# Patient Record
Sex: Female | Born: 1965 | Race: Black or African American | Hispanic: No | Smoking: Never smoker
Health system: Southern US, Community
[De-identification: ages and names within clinical notes are randomized; demographics above are authoritative.]

## PROBLEM LIST (undated history)

## (undated) DIAGNOSIS — M75100 Unspecified rotator cuff tear or rupture of unspecified shoulder, not specified as traumatic: Secondary | ICD-10-CM

## (undated) DIAGNOSIS — F329 Major depressive disorder, single episode, unspecified: Secondary | ICD-10-CM

## (undated) DIAGNOSIS — N951 Menopausal and female climacteric states: Secondary | ICD-10-CM

## (undated) DIAGNOSIS — T7840XA Allergy, unspecified, initial encounter: Secondary | ICD-10-CM

## (undated) DIAGNOSIS — F32A Depression, unspecified: Secondary | ICD-10-CM

## (undated) DIAGNOSIS — E119 Type 2 diabetes mellitus without complications: Secondary | ICD-10-CM

## (undated) DIAGNOSIS — I639 Cerebral infarction, unspecified: Secondary | ICD-10-CM

## (undated) DIAGNOSIS — M069 Rheumatoid arthritis, unspecified: Secondary | ICD-10-CM

## (undated) DIAGNOSIS — G8929 Other chronic pain: Secondary | ICD-10-CM

## (undated) DIAGNOSIS — J45909 Unspecified asthma, uncomplicated: Secondary | ICD-10-CM

## (undated) DIAGNOSIS — IMO0002 Reserved for concepts with insufficient information to code with codable children: Secondary | ICD-10-CM

## (undated) DIAGNOSIS — S83209A Unspecified tear of unspecified meniscus, current injury, unspecified knee, initial encounter: Secondary | ICD-10-CM

## (undated) DIAGNOSIS — M25569 Pain in unspecified knee: Secondary | ICD-10-CM

## (undated) HISTORY — DX: Reserved for concepts with insufficient information to code with codable children: IMO0002

## (undated) HISTORY — DX: Allergy, unspecified, initial encounter: T78.40XA

## (undated) HISTORY — PX: ROTATOR CUFF REPAIR: SHX139

## (undated) HISTORY — DX: Type 2 diabetes mellitus without complications: E11.9

## (undated) HISTORY — DX: Unspecified tear of unspecified meniscus, current injury, unspecified knee, initial encounter: S83.209A

## (undated) HISTORY — DX: Unspecified asthma, uncomplicated: J45.909

## (undated) HISTORY — PX: TUBAL LIGATION: SHX77

## (undated) HISTORY — DX: Depression, unspecified: F32.A

## (undated) HISTORY — DX: Major depressive disorder, single episode, unspecified: F32.9

---

## 2008-04-25 ENCOUNTER — Emergency Department (HOSPITAL_COMMUNITY): Admission: EM | Admit: 2008-04-25 | Discharge: 2008-04-25 | Payer: Self-pay | Admitting: Emergency Medicine

## 2009-07-13 ENCOUNTER — Emergency Department (HOSPITAL_COMMUNITY): Admission: EM | Admit: 2009-07-13 | Discharge: 2009-07-13 | Payer: Self-pay | Admitting: Emergency Medicine

## 2009-09-06 ENCOUNTER — Emergency Department (HOSPITAL_COMMUNITY): Admission: EM | Admit: 2009-09-06 | Discharge: 2009-09-06 | Payer: Self-pay | Admitting: Emergency Medicine

## 2009-09-19 ENCOUNTER — Emergency Department (HOSPITAL_COMMUNITY): Admission: EM | Admit: 2009-09-19 | Discharge: 2009-09-19 | Payer: Self-pay | Admitting: Emergency Medicine

## 2009-10-18 ENCOUNTER — Encounter: Admission: RE | Admit: 2009-10-18 | Discharge: 2009-11-23 | Payer: Self-pay | Admitting: Sports Medicine

## 2009-12-19 ENCOUNTER — Emergency Department (HOSPITAL_COMMUNITY): Admission: EM | Admit: 2009-12-19 | Discharge: 2009-12-19 | Payer: Self-pay | Admitting: Family Medicine

## 2010-04-10 ENCOUNTER — Emergency Department (HOSPITAL_COMMUNITY): Admission: EM | Admit: 2010-04-10 | Discharge: 2010-04-10 | Payer: Self-pay | Admitting: Emergency Medicine

## 2010-09-17 HISTORY — PX: MENISCUS REPAIR: SHX5179

## 2011-09-29 ENCOUNTER — Encounter (HOSPITAL_COMMUNITY): Payer: Self-pay | Admitting: *Deleted

## 2011-09-29 ENCOUNTER — Emergency Department (HOSPITAL_COMMUNITY)
Admission: EM | Admit: 2011-09-29 | Discharge: 2011-09-29 | Disposition: A | Discharge status: Home / Self Care | Payer: Medicaid Other | Source: Home / Self Care | Attending: Emergency Medicine | Admitting: Emergency Medicine

## 2011-09-29 DIAGNOSIS — J020 Streptococcal pharyngitis: Secondary | ICD-10-CM

## 2011-09-29 DIAGNOSIS — R6889 Other general symptoms and signs: Secondary | ICD-10-CM

## 2011-09-29 HISTORY — DX: Unspecified rotator cuff tear or rupture of unspecified shoulder, not specified as traumatic: M75.100

## 2011-09-29 HISTORY — DX: Menopausal and female climacteric states: N95.1

## 2011-09-29 LAB — POCT RAPID STREP A: Streptococcus, Group A Screen (Direct): POSITIVE — AB

## 2011-09-29 MED ORDER — OSELTAMIVIR PHOSPHATE 75 MG PO CAPS: 75.0000 mg | ORAL_CAPSULE | Freq: Two times a day (BID) | ORAL | Status: AC

## 2011-09-29 MED ORDER — ACETAMINOPHEN-CODEINE #3 300-30 MG PO TABS: 1.0000 | ORAL_TABLET | Freq: Four times a day (QID) | ORAL | Status: AC | PRN

## 2011-09-29 MED ORDER — OSELTAMIVIR PHOSPHATE 75 MG PO CAPS: 75.0000 mg | ORAL_CAPSULE | Freq: Two times a day (BID) | ORAL | Status: DC

## 2011-09-29 MED ORDER — CEPHALEXIN 500 MG PO CAPS: 500.0000 mg | ORAL_CAPSULE | Freq: Four times a day (QID) | ORAL | Status: AC

## 2011-09-29 NOTE — ED Notes (Signed)
EMT called to waiting area to assess pt who reported feeling "weak & dizzy." Pt c/o sore throat, with body ache/weakness for about a week. Pt is currently resting comfortably in a wheelchair with minor nausea and weak/achy/dizzy sensation. Pt advised to notify front desk immediately if beginning to feel any worse.

## 2011-09-29 NOTE — ED Provider Notes (Signed)
History     CSN: [id number]  Arrival date & time 09/29/11  1059   First MD Initiated Contact with Patient 09/29/11 1059      Chief Complaint  Patient presents with  . Cough  . Sore Throat  . Fever  . Generalized Body Aches    (Consider location/radiation/quality/duration/timing/severity/associated sxs/prior treatment) HPI Comments: COUGH, AND A SORE THROAT, WITH CONGESTION. BODY ACHES, IT HURTS TO SWALLOW, HAVE PHLEGM WHEN COUGHING, BODY ACHES, AND HEADACHE S   Patient is a 46 y.o. female presenting with cough, pharyngitis, and fever. The history is provided by the patient.  Cough This is a new problem. The current episode started 2 days ago. The problem occurs constantly. The problem has been gradually worsening. The cough is non-productive. The maximum temperature recorded prior to her arrival was 100 to 100.9 F. Associated symptoms include chills, headaches, rhinorrhea, sore throat and myalgias. Pertinent negatives include no shortness of breath and no wheezing. She has tried decongestants for the symptoms. The treatment provided no relief. Her past medical history is significant for asthma.  Sore Throat Associated symptoms include headaches. Pertinent negatives include no shortness of breath.  Fever Primary symptoms of the febrile illness include fever, fatigue, headaches, cough and myalgias. Primary symptoms do not include wheezing or shortness of breath.  The headache is not associated with neck stiffness.    Past Medical History  Diagnosis Date  . Asthma   . Rotator cuff tear   . Perimenopausal     Past Surgical History  Procedure Date  . Cesarean section   . Oophorectomy     History reviewed. No pertinent family history.  History  Substance Use Topics  . Smoking status: Never Smoker   . Smokeless tobacco: Not on file  . Alcohol Use: Yes     Occasional    OB History    Grav Para Term Preterm Abortions TAB SAB Ect Mult Living                  Review  of Systems  Constitutional: Positive for fever, chills, appetite change and fatigue.  HENT: Positive for congestion, sore throat and rhinorrhea. Negative for trouble swallowing, neck pain and neck stiffness.   Respiratory: Positive for cough. Negative for chest tightness, shortness of breath and wheezing.   Musculoskeletal: Positive for myalgias.  Neurological: Positive for headaches.    Allergies  Review of patient's allergies indicates no known allergies.  Home Medications   Current Outpatient Rx  Name Route Sig Dispense Refill  . ALBUTEROL IN Inhalation Inhale into the lungs.    Marland Kitchen ZOLPIDEM TARTRATE PO Oral Take by mouth.    . ACETAMINOPHEN-CODEINE #3 300-30 MG PO TABS Oral Take 1-2 tablets by mouth every 6 (six) hours as needed for pain. 15 tablet 0  . CEPHALEXIN 500 MG PO CAPS Oral Take 1 capsule (500 mg total) by mouth 4 (four) times daily. X 7 days 20 capsule 0  . OSELTAMIVIR PHOSPHATE 75 MG PO CAPS Oral Take 1 capsule (75 mg total) by mouth every 12 (twelve) hours. 10 capsule 0    BP 139/84  Pulse 106  Temp(Src) 100.4 F (38 C) (Oral)  Resp 20  SpO2 100%  LMP 09/27/2011  Physical Exam  Nursing note and vitals reviewed. Constitutional: She appears well-developed and well-nourished. No distress.  HENT:  Head: Normocephalic.  Mouth/Throat: No oropharyngeal exudate.  Eyes: Conjunctivae are normal. Right eye exhibits no discharge. Left eye exhibits no discharge.  Neck: Neck supple. No  JVD present.  Cardiovascular: Regular rhythm.   No extrasystoles are present. Tachycardia present.  PMI is not displaced.  Exam reveals no gallop.   Pulmonary/Chest: Effort normal and breath sounds normal. No respiratory distress. She has no decreased breath sounds. She has no wheezes. She has no rhonchi.  Lymphadenopathy:    She has cervical adenopathy.    ED Course  Procedures (including critical care time)  Labs Reviewed  POCT RAPID STREP A ([facility] CARE ONLY) - Abnormal; Notable for  the following:    Streptococcus, Group A Screen (Direct) POSITIVE (*)    All other components within normal limits   No results found.   1. Influenza-like symptoms   2. Streptococcal pharyngitis       MDM  ILI sxs with pharyngitis and recent exposure to strep, * positive and with anterior cervical LAD Positive household contact with ILI        Jimmie Molly, MD 09/29/11 1653

## 2011-09-29 NOTE — ED Notes (Signed)
Reports severe sore throat, productive cough "with some blood in it", body aches, fever/chills, chest congestion.  All sxs progressively worse since yesterday.  Family member had influenza last week; pt recently had strep.  Has been using albuterol inhaler more frequently; taking Motrin and hydrocodone yesterday.

## 2011-09-29 NOTE — ED Notes (Signed)
T/C from pharmacist: states pt received narcotic Rx 1-2 days ago, inquiring whether Dr. Ladon Applebaum still wished to allow Tylenol-Codeine Rx; per Dr. Ladon Applebaum: dispense #10.

## 2011-09-29 NOTE — ED Notes (Signed)
Pt tolerating ginger ale sips.

## 2012-06-17 ENCOUNTER — Emergency Department (HOSPITAL_COMMUNITY): Payer: Medicaid Other

## 2012-06-17 ENCOUNTER — Emergency Department (HOSPITAL_COMMUNITY)
Admission: EM | Admit: 2012-06-17 | Discharge: 2012-06-17 | Disposition: A | Discharge status: Home / Self Care | Payer: Medicaid Other | Attending: Emergency Medicine | Admitting: Emergency Medicine

## 2012-06-17 ENCOUNTER — Encounter (HOSPITAL_COMMUNITY): Payer: Self-pay | Admitting: Adult Health

## 2012-06-17 DIAGNOSIS — M25561 Pain in right knee: Secondary | ICD-10-CM

## 2012-06-17 DIAGNOSIS — M25569 Pain in unspecified knee: Secondary | ICD-10-CM | POA: Insufficient documentation

## 2012-06-17 DIAGNOSIS — J45909 Unspecified asthma, uncomplicated: Secondary | ICD-10-CM | POA: Insufficient documentation

## 2012-06-17 MED ORDER — TRAMADOL HCL 50 MG PO TABS
50.0000 mg | ORAL_TABLET | Freq: Four times a day (QID) | ORAL | Status: DC | PRN
Start: 2012-06-17 — End: 2013-08-10

## 2012-06-17 MED ORDER — IBUPROFEN 600 MG PO TABS: 600.0000 mg | ORAL_TABLET | Freq: Four times a day (QID) | ORAL | Status: DC | PRN

## 2012-06-17 MED ORDER — KETOROLAC TROMETHAMINE 60 MG/2ML IM SOLN
60.0000 mg | Freq: Once | INTRAMUSCULAR | Status: AC
  Administered 2012-06-17: 60 mg via INTRAMUSCULAR
  Filled 2012-06-17: qty 2

## 2012-06-17 NOTE — ED Notes (Signed)
C/o intermittent leg pain for 20 years since had a knee dislocation, leg pain has gotten worse over the last hour, c/o trying to go up steps and feeling a "snap" in right knee. Unable to lift right leg. Pain is from right knee down. Pt is concerned about the constant bilateral leg pain and the right knee. Concerned her potassium may be low. CMS intact.

## 2012-06-17 NOTE — ED Notes (Signed)
Paged Ortho  

## 2012-06-17 NOTE — Progress Notes (Signed)
Orthopedic Tech Progress Note Patient Details:  [name] 12-08-1965 [id number]  Ortho Devices Type of Ortho Device: Knee Immobilizer;Crutches Ortho Device/Splint Location: (R) LE Ortho Device/Splint Interventions: Application   Jennye Moccasin 06/17/2012, 4:57 PM

## 2012-06-17 NOTE — ED Provider Notes (Signed)
History  Scribed for No att. providers found, the patient was seen in room TR06C/TR06C. This chart was scribed by Candelaria Stagers. The patient's care started at 8:06 PM   CSN: [id number]  Arrival date & time 06/17/12  1508   First MD Initiated Contact with Patient 06/17/12 1532      Chief Complaint  Patient presents with  . Leg Pain    The history is provided by the patient. No language interpreter was used.   [name] is a 46 y.o. female who presents to the Emergency Department complaining of chronic right knee pain that became worse after twisting the knee while coming down stairs earlier today.  After twisting the knee she felt the knee give way.  Pt states she injured the knee about twenty years ago. Bending and straightening the leg makes the pain worse.   Past Medical History  Diagnosis Date  . Asthma   . Rotator cuff tear   . Perimenopausal     Past Surgical History  Procedure Date  . Cesarean section   . Oophorectomy     History reviewed. No pertinent family history.  History  Substance Use Topics  . Smoking status: Never Smoker   . Smokeless tobacco: Not on file  . Alcohol Use: Yes     Occasional    OB History    Grav Para Term Preterm Abortions TAB SAB Ect Mult Living                  Review of Systems  Musculoskeletal: Positive for arthralgias (right knee pain).  All other systems reviewed and are negative.    Allergies  Review of patient's allergies indicates no known allergies.  Home Medications   Current Outpatient Rx  Name Route Sig Dispense Refill  . ALBUTEROL SULFATE HFA 108 (90 BASE) MCG/ACT IN AERS Inhalation Inhale 2 puffs into the lungs every 6 (six) hours as needed. For wheezing    . HYDROCODONE-ACETAMINOPHEN 7.5-325 MG PO TABS Oral Take 1 tablet by mouth every 6 (six) hours as needed. For pain    . IBUPROFEN 600 MG PO TABS Oral Take 1 tablet (600 mg total) by mouth every 6 (six) hours as needed for pain. 30 tablet 0  . TRAMADOL  HCL 50 MG PO TABS Oral Take 1 tablet (50 mg total) by mouth every 6 (six) hours as needed for pain. 15 tablet 0    BP 133/64  Pulse 74  Temp 97.6 F (36.4 C) (Oral)  Resp 18  SpO2 98%  LMP 09/27/2011  Physical Exam  Nursing note and vitals reviewed. Constitutional: She is oriented to person, place, and time. She appears well-developed and well-nourished. No distress.  HENT:  Head: Normocephalic and atraumatic.  Eyes: EOM are normal. Pupils are equal, round, and reactive to light.  Neck: Neck supple. No tracheal deviation present.  Pulmonary/Chest: Effort normal. No respiratory distress.  Musculoskeletal: Normal range of motion. She exhibits no edema.       Mild tenderness over the lateral side of right knee joint.  Decreased ROM esp with flexion due to pain.  No instability.  Obvious trauma, swelling.  Good pulses distally.   Neurological: She is alert and oriented to person, place, and time.  Skin: Skin is warm and dry.  Psychiatric: She has a normal mood and affect. Her behavior is normal.    ED Course  Procedures   DIAGNOSTIC STUDIES: Oxygen Saturation is 98% on room air, normal by my interpretation.  COORDINATION OF CARE:  15:19 Ordered: DG Knee Complete Views Right    Labs Reviewed - No data to display Dg Knee Complete 4 Views Right  06/17/2012  *RADIOLOGY REPORT*  Clinical Data: Pain post trauma  RIGHT KNEE - COMPLETE 4+ VIEW  Comparison: None.  Findings:  Frontal, lateral, and bilateral oblique views were obtained.  There is no fracture, dislocation, or effusion.  There is slight narrowing medially.  There is slight spurring laterally. No erosive change.  IMPRESSION: Mild osteoarthritic change.  No fracture or effusion.   Original Report Authenticated By: Arvin Collard. WOODRUFF III, M.D.      1. Right knee pain       MDM  I personally performed the services described in this documentation, which was scribed in my presence. The recorded information has been  reviewed and considered.         Loren Racer, MD 06/17/12 2006

## 2012-07-02 ENCOUNTER — Encounter (HOSPITAL_COMMUNITY): Payer: Self-pay | Admitting: Psychology

## 2012-07-02 ENCOUNTER — Ambulatory Visit (INDEPENDENT_AMBULATORY_CARE_PROVIDER_SITE_OTHER): Payer: Medicaid Other | Admitting: Psychology

## 2012-07-02 DIAGNOSIS — F3289 Other specified depressive episodes: Secondary | ICD-10-CM

## 2012-07-02 DIAGNOSIS — F329 Major depressive disorder, single episode, unspecified: Secondary | ICD-10-CM
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
## 2012-07-03 ENCOUNTER — Encounter (HOSPITAL_COMMUNITY): Payer: Self-pay | Admitting: Psychology

## 2012-07-03 DIAGNOSIS — F329 Major depressive disorder, single episode, unspecified: Secondary | ICD-10-CM | POA: Insufficient documentation

## 2012-07-09 ENCOUNTER — Ambulatory Visit (INDEPENDENT_AMBULATORY_CARE_PROVIDER_SITE_OTHER): Payer: Medicaid Other | Admitting: Psychology

## 2012-07-09 DIAGNOSIS — F329 Major depressive disorder, single episode, unspecified: Secondary | ICD-10-CM
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
## 2012-07-16 ENCOUNTER — Ambulatory Visit (HOSPITAL_COMMUNITY): Payer: Self-pay | Admitting: Psychology

## 2012-07-23 ENCOUNTER — Ambulatory Visit (INDEPENDENT_AMBULATORY_CARE_PROVIDER_SITE_OTHER): Payer: Medicaid Other | Admitting: Psychology

## 2012-07-23 DIAGNOSIS — F329 Major depressive disorder, single episode, unspecified: Secondary | ICD-10-CM
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
## 2012-07-30 ENCOUNTER — Ambulatory Visit (HOSPITAL_COMMUNITY): Payer: Self-pay | Admitting: Psychology

## 2012-08-06 ENCOUNTER — Ambulatory Visit (INDEPENDENT_AMBULATORY_CARE_PROVIDER_SITE_OTHER): Payer: Medicaid Other | Admitting: Psychology

## 2012-08-06 DIAGNOSIS — F329 Major depressive disorder, single episode, unspecified: Secondary | ICD-10-CM
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
## 2012-08-26 ENCOUNTER — Ambulatory Visit
Admission: RE | Admit: 2012-08-26 | Discharge: 2012-08-26 | Disposition: A | Discharge status: Home / Self Care | Payer: Medicaid Other | Source: Ambulatory Visit | Attending: Orthopedic Surgery | Admitting: Orthopedic Surgery

## 2012-08-26 ENCOUNTER — Other Ambulatory Visit: Payer: Self-pay | Admitting: Orthopedic Surgery

## 2012-08-26 DIAGNOSIS — R609 Edema, unspecified: Secondary | ICD-10-CM

## 2012-08-29 ENCOUNTER — Ambulatory Visit (INDEPENDENT_AMBULATORY_CARE_PROVIDER_SITE_OTHER): Payer: Medicaid Other | Admitting: Psychology

## 2012-08-29 DIAGNOSIS — F329 Major depressive disorder, single episode, unspecified: Secondary | ICD-10-CM
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
## 2012-10-03 ENCOUNTER — Other Ambulatory Visit: Payer: Self-pay | Admitting: Orthopedic Surgery

## 2012-10-03 DIAGNOSIS — M25511 Pain in right shoulder: Secondary | ICD-10-CM

## 2012-10-10 ENCOUNTER — Ambulatory Visit
Admission: RE | Admit: 2012-10-10 | Discharge: 2012-10-10 | Disposition: A | Discharge status: Home / Self Care | Payer: Medicaid Other | Source: Ambulatory Visit | Attending: Orthopedic Surgery | Admitting: Orthopedic Surgery

## 2012-10-10 DIAGNOSIS — M25511 Pain in right shoulder: Secondary | ICD-10-CM

## 2012-10-22 ENCOUNTER — Ambulatory Visit (INDEPENDENT_AMBULATORY_CARE_PROVIDER_SITE_OTHER): Payer: Federal, State, Local not specified - Other | Admitting: Psychology

## 2012-10-22 DIAGNOSIS — F329 Major depressive disorder, single episode, unspecified: Secondary | ICD-10-CM
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
## 2012-10-29 ENCOUNTER — Ambulatory Visit (HOSPITAL_COMMUNITY): Payer: Self-pay | Admitting: Psychology

## 2013-01-01 ENCOUNTER — Encounter (HOSPITAL_COMMUNITY): Payer: Self-pay | Admitting: Psychology

## 2013-02-04 ENCOUNTER — Ambulatory Visit (INDEPENDENT_AMBULATORY_CARE_PROVIDER_SITE_OTHER): Payer: Federal, State, Local not specified - Other | Admitting: Psychology

## 2013-02-04 DIAGNOSIS — F329 Major depressive disorder, single episode, unspecified: Secondary | ICD-10-CM
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
## 2013-05-12 ENCOUNTER — Ambulatory Visit (HOSPITAL_COMMUNITY): Payer: Self-pay | Admitting: Licensed Clinical Social Worker

## 2013-05-12 ENCOUNTER — Encounter (HOSPITAL_COMMUNITY): Payer: Self-pay | Admitting: Licensed Clinical Social Worker

## 2013-05-12 NOTE — Progress Notes (Signed)
Patient ID: [name], female   DOB: [birth year], 47 y.o.   MRN: [id number] Patient cancelled late for appointment due to strep throat. She has rescheduled.

## 2013-07-02 ENCOUNTER — Encounter (INDEPENDENT_AMBULATORY_CARE_PROVIDER_SITE_OTHER): Payer: Self-pay

## 2013-07-02 ENCOUNTER — Ambulatory Visit (INDEPENDENT_AMBULATORY_CARE_PROVIDER_SITE_OTHER): Payer: Federal, State, Local not specified - Other | Admitting: Licensed Clinical Social Worker

## 2013-07-02 DIAGNOSIS — F4323 Adjustment disorder with mixed anxiety and depressed mood: Secondary | ICD-10-CM
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
## 2013-08-04 ENCOUNTER — Ambulatory Visit (HOSPITAL_COMMUNITY): Payer: Self-pay | Admitting: Licensed Clinical Social Worker

## 2013-08-04 ENCOUNTER — Encounter (HOSPITAL_COMMUNITY): Payer: Self-pay | Admitting: Licensed Clinical Social Worker

## 2013-08-04 NOTE — Progress Notes (Signed)
Patient ID: [name], female   DOB: 1965/12/21, 47 y.o.   MRN: [id number] Patient was a no show no call.

## 2013-08-10 ENCOUNTER — Emergency Department (HOSPITAL_COMMUNITY): Payer: Medicaid Other

## 2013-08-10 ENCOUNTER — Emergency Department (INDEPENDENT_AMBULATORY_CARE_PROVIDER_SITE_OTHER): Payer: Medicaid Other

## 2013-08-10 ENCOUNTER — Encounter (HOSPITAL_COMMUNITY): Payer: Self-pay | Admitting: Emergency Medicine

## 2013-08-10 ENCOUNTER — Emergency Department (HOSPITAL_COMMUNITY)
Admission: EM | Admit: 2013-08-10 | Discharge: 2013-08-10 | Disposition: A | Discharge status: Home / Self Care | Payer: Medicaid Other | Attending: Emergency Medicine | Admitting: Emergency Medicine

## 2013-08-10 ENCOUNTER — Emergency Department (HOSPITAL_COMMUNITY)
Admission: EM | Admit: 2013-08-10 | Discharge: 2013-08-10 | Disposition: A | Discharge status: Home / Self Care | Payer: Medicaid Other | Source: Home / Self Care | Attending: Family Medicine | Admitting: Family Medicine

## 2013-08-10 DIAGNOSIS — Y9301 Activity, walking, marching and hiking: Secondary | ICD-10-CM | POA: Insufficient documentation

## 2013-08-10 DIAGNOSIS — S99921A Unspecified injury of right foot, initial encounter: Secondary | ICD-10-CM

## 2013-08-10 DIAGNOSIS — Z87828 Personal history of other (healed) physical injury and trauma: Secondary | ICD-10-CM | POA: Insufficient documentation

## 2013-08-10 DIAGNOSIS — T148XXA Other injury of unspecified body region, initial encounter: Secondary | ICD-10-CM

## 2013-08-10 DIAGNOSIS — Z9889 Other specified postprocedural states: Secondary | ICD-10-CM | POA: Insufficient documentation

## 2013-08-10 DIAGNOSIS — S8990XA Unspecified injury of unspecified lower leg, initial encounter: Secondary | ICD-10-CM | POA: Insufficient documentation

## 2013-08-10 DIAGNOSIS — S4980XA Other specified injuries of shoulder and upper arm, unspecified arm, initial encounter: Secondary | ICD-10-CM | POA: Insufficient documentation

## 2013-08-10 DIAGNOSIS — Y9289 Other specified places as the place of occurrence of the external cause: Secondary | ICD-10-CM | POA: Insufficient documentation

## 2013-08-10 DIAGNOSIS — J45909 Unspecified asthma, uncomplicated: Secondary | ICD-10-CM | POA: Insufficient documentation

## 2013-08-10 DIAGNOSIS — W010XXA Fall on same level from slipping, tripping and stumbling without subsequent striking against object, initial encounter: Secondary | ICD-10-CM | POA: Insufficient documentation

## 2013-08-10 DIAGNOSIS — S8991XA Unspecified injury of right lower leg, initial encounter: Secondary | ICD-10-CM

## 2013-08-10 DIAGNOSIS — W19XXXA Unspecified fall, initial encounter: Secondary | ICD-10-CM

## 2013-08-10 DIAGNOSIS — S4991XA Unspecified injury of right shoulder and upper arm, initial encounter: Secondary | ICD-10-CM

## 2013-08-10 DIAGNOSIS — Z8742 Personal history of other diseases of the female genital tract: Secondary | ICD-10-CM | POA: Insufficient documentation

## 2013-08-10 DIAGNOSIS — S46909A Unspecified injury of unspecified muscle, fascia and tendon at shoulder and upper arm level, unspecified arm, initial encounter: Secondary | ICD-10-CM | POA: Insufficient documentation

## 2013-08-10 DIAGNOSIS — Z79899 Other long term (current) drug therapy: Secondary | ICD-10-CM | POA: Insufficient documentation

## 2013-08-10 DIAGNOSIS — G8929 Other chronic pain: Secondary | ICD-10-CM | POA: Insufficient documentation

## 2013-08-10 HISTORY — DX: Pain in unspecified knee: M25.569

## 2013-08-10 HISTORY — DX: Other chronic pain: G89.29

## 2013-08-10 MED ORDER — HYDROCODONE-ACETAMINOPHEN 5-325 MG PO TABS: 2.0000 | ORAL_TABLET | ORAL | Status: DC | PRN

## 2013-08-10 MED ORDER — CYCLOBENZAPRINE HCL 5 MG PO TABS: 5.0000 mg | ORAL_TABLET | Freq: Three times a day (TID) | ORAL | Status: DC | PRN

## 2013-08-10 MED ORDER — IBUPROFEN 800 MG PO TABS: 800.0000 mg | ORAL_TABLET | Freq: Three times a day (TID) | ORAL | Status: DC

## 2013-08-10 NOTE — ED Provider Notes (Signed)
Medical screening examination/treatment/procedure(s) were performed by non-physician practitioner and as supervising physician I was immediately available for consultation/collaboration.  EKG Interpretation   None         Travius Crochet M Radley Teston, MD 08/10/13 1511 

## 2013-08-10 NOTE — ED Provider Notes (Signed)
Medical screening examination/treatment/procedure(s) were performed by a resident physician or non-physician practitioner and as the supervising physician I was immediately available for consultation/collaboration.  Evan Corey, MD      Evan S Corey, MD 08/10/13 2114 

## 2013-08-10 NOTE — ED Provider Notes (Signed)
CSN: [id number]     Arrival date & time 08/10/13  4098 History   First MD Initiated Contact with Patient 08/10/13 1014     Chief Complaint  Patient presents with  . Knee Pain  . Shoulder Pain   (Consider location/radiation/quality/duration/timing/severity/associated sxs/prior Treatment) HPI Comments: Patient is a 47 year old female who presents after a fall that occurred yesterday. Patient reports walking to her car in the rain when she slipped and landed on her right side in the parking lot. She reports immediate onset of throbbing and severe pain in her right shoulder, right knee and right foot. Movement of the joints makes the pain worse. No alleviating factors. She tried taking tylenol which provided no relief. She has a history of right shoulder and right knee surgery and is concerned about re-injuring those joints. She made an appointment to follow up with Dr. Thurston Hole later this week. Patient denies any other injury.    Past Medical History  Diagnosis Date  . Asthma   . Rotator cuff tear   . Perimenopausal   . Slipped intervertebral disc   . Meniscus tear   . Chronic knee pain     right   Past Surgical History  Procedure Laterality Date  . Cesarean section    . Oophorectomy    . Tubal ligation     Family History  Problem Relation Age of Onset  . Cancer Mother     colon cancer  . Cancer Father     brain cancer  . Alcohol abuse Father   . Diabetes Father    History  Substance Use Topics  . Smoking status: Never Smoker   . Smokeless tobacco: Never Used  . Alcohol Use: No   OB History   Grav Para Term Preterm Abortions TAB SAB Ect Mult Living                 Review of Systems  Musculoskeletal: Positive for arthralgias.  All other systems reviewed and are negative.    Allergies  Diclofenac sodium and Meloxicam  Home Medications   Current Outpatient Rx  Name  Route  Sig  Dispense  Refill  . acetaminophen (TYLENOL) 500 MG tablet   Oral   Take 500 mg by  mouth every 6 (six) hours as needed.         Marland Kitchen albuterol (PROVENTIL HFA;VENTOLIN HFA) 108 (90 BASE) MCG/ACT inhaler   Inhalation   Inhale 2 puffs into the lungs every 6 (six) hours as needed. For wheezing          BP 132/76  Pulse 82  Temp(Src) 97.9 F (36.6 C) (Oral)  Resp 18  Ht 5' 9.5" (1.765 m)  Wt 240 lb (108.863 kg)  BMI 34.95 kg/m2  SpO2 97%  LMP 09/27/2011 Physical Exam  Nursing note and vitals reviewed. Constitutional: She is oriented to person, place, and time. She appears well-developed and well-nourished. No distress.  HENT:  Head: Normocephalic and atraumatic.  Eyes: Conjunctivae are normal.  Neck: Normal range of motion.  Cardiovascular: Normal rate and regular rhythm.  Exam reveals no gallop and no friction rub.   No murmur heard. Pulmonary/Chest: Effort normal and breath sounds normal. She has no wheezes. She has no rales. She exhibits no tenderness.  Musculoskeletal:  Right shoulder, right knee ROM slightly limited due to pain. Generalized tenderness to palpation of the affected joints. No obvious deformity. Tenderness to palpation of lateral right foot without point tenderness to palpation.   Neurological: She  is alert and oriented to person, place, and time. Coordination normal.  Speech is goal-oriented. Moves limbs without ataxia.   Skin: Skin is warm and dry.  Psychiatric: She has a normal mood and affect. Her behavior is normal.    ED Course  Procedures (including critical care time) Labs Review Labs Reviewed - No data to display Imaging Review Dg Shoulder Right  08/10/2013   CLINICAL DATA:  Fall, pain  EXAM: RIGHT SHOULDER - 2+ VIEW  COMPARISON:  10/10/2012 MRI  FINDINGS: There is no evidence of fracture or dislocation. There is no evidence of arthropathy or other focal bone abnormality. Soft tissues are unremarkable.  IMPRESSION: No acute osseous finding   Electronically Signed   By: Ruel Favors M.D.   On: 08/10/2013 10:49   Dg Knee Complete 4  Views Right  08/10/2013   CLINICAL DATA:  Fall, lateral pain  EXAM: RIGHT KNEE - COMPLETE 4+ VIEW  COMPARISON:  06/26/2012, 06/17/2012  FINDINGS: Minor arthritic changes with bony spurring of the femur and tibia articular surfaces. Slight joint space narrowing in the medial compartment. No acute fracture, malalignment, or effusion. No significant interval change.  IMPRESSION: Minor degenerative changes by plain radiography.  No acute finding.   Electronically Signed   By: Ruel Favors M.D.   On: 08/10/2013 10:52   Dg Foot Complete Right  08/10/2013   CLINICAL DATA:  Fall, metatarsal pain  EXAM: RIGHT FOOT COMPLETE - 3+ VIEW  COMPARISON:  None.  FINDINGS: Normal alignment without fracture. Preserved joint spaces. No significant arthropathy. No soft tissue abnormality. Small calcaneal spurring noted.  IMPRESSION: No acute osseous finding   Electronically Signed   By: Ruel Favors M.D.   On: 08/10/2013 10:47    EKG Interpretation   None       MDM   1. Fall, initial encounter   2. Right shoulder injury, initial encounter   3. Right knee injury, initial encounter   4. Right foot injury, initial encounter     11:09 AM Patient's xrays unremarkable for acute injuries. No neurovascular compromise. Patient will have Vicodin for pain. Patient made an appointment to follow up with Dr. Thurston Hole this week. Patient instructed to return with worsening or concerning symptoms.     Emilia Beck, PA-C 08/10/13 1118
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
## 2013-08-11 ENCOUNTER — Encounter (HOSPITAL_COMMUNITY): Payer: Self-pay | Admitting: Licensed Clinical Social Worker

## 2013-08-11 ENCOUNTER — Ambulatory Visit (HOSPITAL_COMMUNITY): Payer: Self-pay | Admitting: Licensed Clinical Social Worker

## 2013-08-11 NOTE — Progress Notes (Signed)
Patient ID: [name], female   DOB: [birth year], 47 y.o.   MRN: [id number] Patient was a no show. Called and spoke to patient who believed her appointment was at 11:30am. Reviewed no show policy with patient.

## 2013-08-18 ENCOUNTER — Ambulatory Visit (INDEPENDENT_AMBULATORY_CARE_PROVIDER_SITE_OTHER): Payer: Federal, State, Local not specified - Other | Admitting: Licensed Clinical Social Worker

## 2013-08-18 DIAGNOSIS — F4323 Adjustment disorder with mixed anxiety and depressed mood: Secondary | ICD-10-CM
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
## 2013-08-25 ENCOUNTER — Ambulatory Visit (INDEPENDENT_AMBULATORY_CARE_PROVIDER_SITE_OTHER): Payer: Federal, State, Local not specified - Other | Admitting: Licensed Clinical Social Worker

## 2013-08-25 DIAGNOSIS — F4323 Adjustment disorder with mixed anxiety and depressed mood: Secondary | ICD-10-CM
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
## 2013-09-01 ENCOUNTER — Ambulatory Visit (INDEPENDENT_AMBULATORY_CARE_PROVIDER_SITE_OTHER): Payer: Federal, State, Local not specified - Other | Admitting: Licensed Clinical Social Worker

## 2013-09-01 DIAGNOSIS — F4323 Adjustment disorder with mixed anxiety and depressed mood: Secondary | ICD-10-CM

## 2013-09-01 NOTE — Progress Notes (Signed)
   THERAPIST PROGRESS NOTE  Session Time: 9:30am-10:20am  Participation Level: Active  Behavioral Response: Well GroomedAlertAnxious and Depressed  Type of Therapy: Individual Therapy  Treatment Goals addressed: Anger, Anxiety, Communication: assertive communication, increased focus on asking for help, intentional self care  and Coping  Interventions: Motivational Interviewing, Strength-based and Supportive  Summary: [name] is a 47 y.o. female who presents with anxious mood and affect. She reports ongoing stress related to her children and the lawsuit with their school. She processes her attempts to improve her self care and to ask for help. She explores why this is difficult for her and how she can make changes. She is positively focused on her children who are doing well. Her sleep and appetite are wnl.   Suicidal/Homicidal: Nowithout intent/plan  Therapist Response: Assessed patients current functioning and reviewed progress. Reviewed coping strategies. Assessed patients safety and assisted in identifying protective factors.  Reviewed crisis plan with patient. Assisted patient with the expression of frustration and fear. Reviewed patients self care plan. Assessed progress related to self care. Patients self care is good. Recommend daily exercise, increased socialization and recreation. Used CBT to assist patient with the identification of negative distortions and irrational thoughts. Encouraged patient to verbalize alternative and factual responses which challenge thought distortions. Reviewed healthy boundaries and assertive communication. Used motivational interviewing to assist and encourage patient through the change process. Explored patients barriers to change.   Plan: Return again in two weeks.  Diagnosis: Axis I: Adjustment Disorder with Mixed Emotional Features    Axis II: No diagnosis    Kolter Reaver, LCSW 09/01/2013

## 2013-09-08 ENCOUNTER — Ambulatory Visit (INDEPENDENT_AMBULATORY_CARE_PROVIDER_SITE_OTHER): Payer: Federal, State, Local not specified - Other | Admitting: Licensed Clinical Social Worker

## 2013-09-08 DIAGNOSIS — F329 Major depressive disorder, single episode, unspecified: Secondary | ICD-10-CM

## 2013-09-08 NOTE — Progress Notes (Signed)
   THERAPIST PROGRESS NOTE  Session Time: 10:30am-11:20am  Participation Level: Active  Behavioral Response: Well GroomedAlertAnxious and Depressed  Type of Therapy: Individual Therapy  Treatment Goals addressed: Coping  Interventions: CBT, Strength-based, Supportive and Reframing  Summary: EUTHA CUDE is a 47 y.o. female who presents with depressed mood and flat affect. She states that she feels "heavy with sad emotion today". She is disappointed by the recommendations of her attorneys and states that she feels oppressed. She questions what her next steps should be and evaluates how her mental wellness is affected by this ongoing fight. She finds herself with more agitation towards her children which is unusual and upsets her. Her sleep and appetite are disrupted by her stress.    Suicidal/Homicidal: Nowithout intent/plan  Therapist Response: Assessed patients current functioning and reviewed progress. Reviewed coping strategies. Assessed patients safety and assisted in identifying protective factors.  Reviewed crisis plan with patient. Assisted patient with the expression of frustration. Reviewed patients self care plan. Assessed progress related to self care. Patients self care is good. Recommend daily exercise, increased socialization and recreation. Used CBT to assist patient with the identification of negative distortions and irrational thoughts. Encouraged patient to verbalize alternative and factual responses which challenge thought distortions. Reviewed healthy boundaries and assertive communication. Used DBT to practice mindfulness, review distraction list and improve distress tolerance skills.   Plan: Return again in one  Weeks. Patient will develop a self care plan that includes exercise and stress reduction activities. Patient will use cognitive behavioral strategies to combat any negative distorted thoughts.   Diagnosis: Axis I: Adjustment Disorder with Mixed Emotional  Features    Axis II: No diagnosis    Keyunna Coco, LCSW 09/08/2013

## 2013-09-15 ENCOUNTER — Ambulatory Visit (HOSPITAL_COMMUNITY): Payer: Self-pay | Admitting: Licensed Clinical Social Worker

## 2013-09-22 ENCOUNTER — Ambulatory Visit (HOSPITAL_COMMUNITY): Payer: Self-pay | Admitting: Licensed Clinical Social Worker

## 2013-10-02 ENCOUNTER — Encounter (HOSPITAL_COMMUNITY): Payer: Self-pay | Admitting: Emergency Medicine

## 2013-10-02 ENCOUNTER — Emergency Department (HOSPITAL_COMMUNITY)
Admission: EM | Admit: 2013-10-02 | Discharge: 2013-10-02 | Disposition: A | Discharge status: Home / Self Care | Payer: Medicaid Other | Attending: Emergency Medicine | Admitting: Emergency Medicine

## 2013-10-02 DIAGNOSIS — Z9889 Other specified postprocedural states: Secondary | ICD-10-CM | POA: Insufficient documentation

## 2013-10-02 DIAGNOSIS — Y9389 Activity, other specified: Secondary | ICD-10-CM | POA: Insufficient documentation

## 2013-10-02 DIAGNOSIS — X500XXA Overexertion from strenuous movement or load, initial encounter: Secondary | ICD-10-CM | POA: Insufficient documentation

## 2013-10-02 DIAGNOSIS — S46909A Unspecified injury of unspecified muscle, fascia and tendon at shoulder and upper arm level, unspecified arm, initial encounter: Secondary | ICD-10-CM | POA: Insufficient documentation

## 2013-10-02 DIAGNOSIS — G8929 Other chronic pain: Secondary | ICD-10-CM | POA: Insufficient documentation

## 2013-10-02 DIAGNOSIS — Y929 Unspecified place or not applicable: Secondary | ICD-10-CM | POA: Insufficient documentation

## 2013-10-02 DIAGNOSIS — S4980XA Other specified injuries of shoulder and upper arm, unspecified arm, initial encounter: Secondary | ICD-10-CM | POA: Insufficient documentation

## 2013-10-02 DIAGNOSIS — J45909 Unspecified asthma, uncomplicated: Secondary | ICD-10-CM | POA: Insufficient documentation

## 2013-10-02 DIAGNOSIS — Z8739 Personal history of other diseases of the musculoskeletal system and connective tissue: Secondary | ICD-10-CM | POA: Insufficient documentation

## 2013-10-02 DIAGNOSIS — S4991XA Unspecified injury of right shoulder and upper arm, initial encounter: Secondary | ICD-10-CM

## 2013-10-02 DIAGNOSIS — Z8742 Personal history of other diseases of the female genital tract: Secondary | ICD-10-CM | POA: Insufficient documentation

## 2013-10-02 DIAGNOSIS — Z79899 Other long term (current) drug therapy: Secondary | ICD-10-CM | POA: Insufficient documentation

## 2013-10-02 MED ORDER — HYDROCODONE-ACETAMINOPHEN 5-325 MG PO TABS: 1.0000 | ORAL_TABLET | Freq: Four times a day (QID) | ORAL | Status: DC | PRN

## 2013-10-02 MED ORDER — HYDROCODONE-ACETAMINOPHEN 5-325 MG PO TABS
1.0000 | ORAL_TABLET | Freq: Once | ORAL | Status: AC
  Administered 2013-10-02: 1 via ORAL
  Filled 2013-10-02: qty 1

## 2013-10-02 NOTE — ED Notes (Signed)
Per pt pulled something in right shoulder, has surgery in Sept

## 2013-10-02 NOTE — ED Provider Notes (Signed)
Medical screening examination/treatment/procedure(s) were performed by non-physician practitioner and as supervising physician I was immediately available for consultation/collaboration.  EKG Interpretation   None        Jasper Riling. Alvino Chapel, MD 10/02/13 1504

## 2013-10-02 NOTE — ED Provider Notes (Signed)
CSN: [id number]     Arrival date & time 10/02/13  1033 History   First MD Initiated Contact with Patient 10/02/13 1043     No chief complaint on file.  (Consider location/radiation/quality/duration/timing/severity/associated sxs/prior Treatment) HPI  48 year old female with history of chronic knee pain, rotator cuff tear presents for evaluation of right arm pain. 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 Noemi Chapel last September.  She has been doing well with rehab.  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  While caring for her child she experienced a muscle pulled sensation to R shoulder follows with sharp throbbing pain.  Pain is constant, worse with movement, radiates down to arm, and minimally improves with heat/ice and rest.  She tries to rehab her shoulder using exercise at home without improvement.  She plan to f/u with her ortho specialist next week but is here for further manage of her condition.  She worries of re-injuring her shoulder.  Denies fever, neck pain, headache, numbness, weakness, or rash.  No cp or sob.    Past Medical History  Diagnosis Date  . Asthma   . Rotator cuff tear   . Perimenopausal   . Slipped intervertebral disc   . Meniscus tear   . Chronic knee pain     right   Past Surgical History  Procedure Laterality Date  . Cesarean section    . Oophorectomy    . Tubal ligation     Family History  Problem Relation Age of Onset  . Cancer Mother     colon cancer  . Cancer Father     brain cancer  . Alcohol abuse Father   . Diabetes Father    History  Substance Use Topics  . Smoking status: Never Smoker   . Smokeless tobacco: Never Used  . Alcohol Use: No   OB History   Grav Para Term Preterm Abortions TAB SAB Ect Mult Living                 Review of Systems  Constitutional: Negative for fever.   Musculoskeletal: Positive for arthralgias.  Neurological: Negative for numbness.    Allergies  Diclofenac sodium and Meloxicam  Home Medications   Current Outpatient Rx  Name  Route  Sig  Dispense  Refill  . acetaminophen (TYLENOL) 500 MG tablet   Oral   Take 500 mg by mouth every 6 (six) hours as needed.         Marland Kitchen albuterol (PROVENTIL HFA;VENTOLIN HFA) 108 (90 BASE) MCG/ACT inhaler   Inhalation   Inhale 2 puffs into the lungs every 6 (six) hours as needed. For wheezing         . ibuprofen (ADVIL,MOTRIN) 200 MG tablet   Oral   Take 400 mg by mouth every 6 (six) hours as needed.          BP 134/68  Pulse 76  Temp(Src) 97.8 F (36.6 C) (Oral)  Resp 17  SpO2 98%  LMP 09/27/2011 Physical Exam  Nursing note and vitals reviewed. Constitutional: She appears well-developed and well-nourished. No distress.  HENT:  Head: Atraumatic.  Eyes: Conjunctivae are normal.  Neck: Neck supple.  Musculoskeletal: She exhibits tenderness (R shoulder: decreased shoulder abduction, adduction, rotation, or elevation due to pain.  pain to shoulder most specific at lateral and glenohumeral region without deformity  or signs of dislocation.  no swelling or rash.  ). She exhibits no edema.  Normal elbow and wrist.  Brisk cap refills, radial pulses 2+ to R arm.    Neurological: She is alert.  Skin: No rash noted.  Psychiatric: She has a normal mood and affect.    ED Course  Procedures (including critical care time)  11:14 AM Patient with right rotator cuff injury status post surgical repair is here with pain to right shoulder from possible reinjuring. This is likely a muscle strain based on mechanism however a muscle tear cannot be entirely excluded. She is neurovascularly intact. No evidence to suggest shoulder dislocation or fracture based on mechanism. Patient is scheduled to followup with the orthopedist for a repeat MRI. Therefore, I do not think advanced imaging is necessary at this  time. We'll provide a short course of pain medication. Patient to continue wearing sling, perform RICE therapy, and to follow up with the specialist. Return precautions discussed. Patient voiced understanding and agrees with plan.  Labs Review Labs Reviewed - No data to display Imaging Review No results found.  EKG Interpretation   None       MDM   1. Right shoulder injury    BP 134/68  Pulse 76  Temp(Src) 97.8 F (36.6 C) (Oral)  Resp 17  SpO2 98%  LMP 09/27/2011     Domenic Moras, PA-C 10/02/13 1117

## 2013-10-02 NOTE — Discharge Instructions (Signed)
Wear your sling, take pain medication as needed for pain and follow up with your orthopedic specialist for further care.  RICE: Routine Care for Injuries The routine care of many injuries includes Rest, Ice, Compression, and Elevation (RICE). HOME CARE INSTRUCTIONS  Rest is needed to allow your body to heal. Routine activities can usually be resumed when comfortable. Injured tendons and bones can take up to 6 weeks to heal. Tendons are the cord-like structures that attach muscle to bone.  Ice following an injury helps keep the swelling down and reduces pain.  Put ice in a plastic bag.  Place a towel between your skin and the bag.  Leave the ice on for 15-20 minutes, 03-04 times a day. Do this while awake, for the first 24 to 48 hours. After that, continue as directed by your caregiver.  Compression helps keep swelling down. It also gives support and helps with discomfort. If an elastic bandage has been applied, it should be removed and reapplied every 3 to 4 hours. It should not be applied tightly, but firmly enough to keep swelling down. Watch fingers or toes for swelling, bluish discoloration, coldness, numbness, or excessive pain. If any of these problems occur, remove the bandage and reapply loosely. Contact your caregiver if these problems continue.  Elevation helps reduce swelling and decreases pain. With extremities, such as the arms, hands, legs, and feet, the injured area should be placed near or above the level of the heart, if possible. SEEK IMMEDIATE MEDICAL CARE IF:  You have persistent pain and swelling.  You develop redness, numbness, or unexpected weakness.  Your symptoms are getting worse rather than improving after several days. These symptoms may indicate that further evaluation or further X-rays are needed. Sometimes, X-rays may not show a small broken bone (fracture) until 1 week or 10 days later. Make a follow-up appointment with your caregiver. Ask when your X-ray  results will be ready. Make sure you get your X-ray results. Document Released: 12/16/2000 Document Revised: 11/26/2011 Document Reviewed: 02/02/2011 [facility] Patient Information 2014 [place], [place].

## 2013-10-09 ENCOUNTER — Emergency Department (HOSPITAL_COMMUNITY)
Admission: EM | Admit: 2013-10-09 | Discharge: 2013-10-09 | Disposition: A | Discharge status: Home / Self Care | Payer: Medicaid Other | Source: Home / Self Care | Attending: Family Medicine | Admitting: Family Medicine

## 2013-10-09 ENCOUNTER — Encounter (HOSPITAL_COMMUNITY): Payer: Self-pay | Admitting: Emergency Medicine

## 2013-10-09 DIAGNOSIS — M67919 Unspecified disorder of synovium and tendon, unspecified shoulder: Secondary | ICD-10-CM

## 2013-10-09 DIAGNOSIS — M719 Bursopathy, unspecified: Secondary | ICD-10-CM

## 2013-10-09 DIAGNOSIS — M25311 Other instability, right shoulder: Secondary | ICD-10-CM

## 2013-10-09 MED ORDER — HYDROCODONE-ACETAMINOPHEN 5-325 MG PO TABS: 1.0000 | ORAL_TABLET | Freq: Four times a day (QID) | ORAL | Status: DC | PRN

## 2013-10-09 NOTE — Discharge Instructions (Signed)
Wear sling and use ice for comfort, pain med as needed, see ortho at 3:45 today.

## 2013-10-09 NOTE — ED Notes (Signed)
Reports having surgery on right shoulder -wainer-unable to see today.  Patient concerned she may have pulled something in this shoulder, unable to lift from her side.  Pain inside shoulder

## 2013-10-09 NOTE — ED Notes (Signed)
Spoke to dr Penelope Coop office for appt-patient to be seen at 3:45 todayby kirstin, pa, dr kindl notified

## 2013-10-09 NOTE — ED Provider Notes (Signed)
CSN: [id number]     Arrival date & time 10/09/13  6440 History   First MD Initiated Contact with Patient 10/09/13 [phone]     Chief Complaint  Patient presents with  . Shoulder Pain   (Consider location/radiation/quality/duration/timing/severity/associated sxs/prior Treatment) Patient is a 48 y.o. female presenting with shoulder pain. The history is provided by the patient.  Shoulder Pain This is a recurrent problem. The current episode started more than 1 week ago (seen 1 wk ago in ER for same problem, unable to get f/u with dr Noemi Chapel about shoulder pain./ injury.). The problem has not changed since onset.   Past Medical History  Diagnosis Date  . Asthma   . Rotator cuff tear   . Perimenopausal   . Slipped intervertebral disc   . Meniscus tear   . Chronic knee pain     right   Past Surgical History  Procedure Laterality Date  . Cesarean section    . Oophorectomy    . Tubal ligation    . Shoulder surgery     Family History  Problem Relation Age of Onset  . Cancer Mother     colon cancer  . Cancer Father     brain cancer  . Alcohol abuse Father   . Diabetes Father    History  Substance Use Topics  . Smoking status: Never Smoker   . Smokeless tobacco: Never Used  . Alcohol Use: No   OB History   Grav Para Term Preterm Abortions TAB SAB Ect Mult Living                 Review of Systems  Constitutional: Negative.   Musculoskeletal: Positive for joint swelling.  Skin: Negative.     Allergies  Diclofenac sodium and Meloxicam  Home Medications   Current Outpatient Rx  Name  Route  Sig  Dispense  Refill  . acetaminophen (TYLENOL) 500 MG tablet   Oral   Take 500 mg by mouth every 6 (six) hours as needed.         Marland Kitchen albuterol (PROVENTIL HFA;VENTOLIN HFA) 108 (90 BASE) MCG/ACT inhaler   Inhalation   Inhale 2 puffs into the lungs every 6 (six) hours as needed. For wheezing         . HYDROcodone-acetaminophen (NORCO/VICODIN) 5-325 MG per tablet   Oral  Take 1 tablet by mouth every 6 (six) hours as needed for severe pain.   10 tablet   0   . HYDROcodone-acetaminophen (NORCO/VICODIN) 5-325 MG per tablet   Oral   Take 1 tablet by mouth every 6 (six) hours as needed.   20 tablet   0   . ibuprofen (ADVIL,MOTRIN) 200 MG tablet   Oral   Take 400 mg by mouth every 6 (six) hours as needed.          BP 117/77  Pulse 73  Temp(Src) 97.7 F (36.5 C) (Oral)  Resp 20  SpO2 98%  LMP 09/27/2011 Physical Exam  Nursing note and vitals reviewed. Constitutional: She is oriented to person, place, and time. She appears well-developed and well-nourished.  Musculoskeletal: She exhibits tenderness.       Right shoulder: She exhibits decreased range of motion, tenderness, bony tenderness, spasm and decreased strength. She exhibits normal pulse.       Arms: Neurological: She is alert and oriented to person, place, and time.  Skin: Skin is warm and dry.    ED Course  Procedures (including critical care time) Labs Review Labs Reviewed -  No data to display Imaging Review No results found.  EKG Interpretation    Date/Time:    Ventricular Rate:    PR Interval:    QRS Duration:   QT Interval:    QTC Calculation:   R Axis:     Text Interpretation:              MDM      Billy Fischer, MD 10/09/13 0900

## 2013-11-18 ENCOUNTER — Emergency Department (HOSPITAL_COMMUNITY)
Admission: EM | Admit: 2013-11-18 | Discharge: 2013-11-18 | Disposition: A | Discharge status: Home / Self Care | Payer: Medicaid Other | Attending: Emergency Medicine | Admitting: Emergency Medicine

## 2013-11-18 ENCOUNTER — Encounter (HOSPITAL_COMMUNITY): Payer: Self-pay | Admitting: Emergency Medicine

## 2013-11-18 ENCOUNTER — Emergency Department (HOSPITAL_COMMUNITY): Payer: Medicaid Other

## 2013-11-18 DIAGNOSIS — M25569 Pain in unspecified knee: Secondary | ICD-10-CM | POA: Insufficient documentation

## 2013-11-18 DIAGNOSIS — J45909 Unspecified asthma, uncomplicated: Secondary | ICD-10-CM | POA: Insufficient documentation

## 2013-11-18 DIAGNOSIS — Z8742 Personal history of other diseases of the female genital tract: Secondary | ICD-10-CM | POA: Insufficient documentation

## 2013-11-18 DIAGNOSIS — Z79899 Other long term (current) drug therapy: Secondary | ICD-10-CM | POA: Insufficient documentation

## 2013-11-18 DIAGNOSIS — Z87828 Personal history of other (healed) physical injury and trauma: Secondary | ICD-10-CM | POA: Insufficient documentation

## 2013-11-18 DIAGNOSIS — M25561 Pain in right knee: Secondary | ICD-10-CM

## 2013-11-18 DIAGNOSIS — G8929 Other chronic pain: Secondary | ICD-10-CM | POA: Insufficient documentation

## 2013-11-18 DIAGNOSIS — R42 Dizziness and giddiness: Secondary | ICD-10-CM | POA: Insufficient documentation

## 2013-11-18 MED ORDER — IBUPROFEN 800 MG PO TABS
800.0000 mg | ORAL_TABLET | Freq: Once | ORAL | Status: AC
  Administered 2013-11-18: 800 mg via ORAL
  Filled 2013-11-18: qty 1

## 2013-11-18 MED ORDER — HYDROCODONE-ACETAMINOPHEN 5-325 MG PO TABS: 1.0000 | ORAL_TABLET | ORAL | Status: DC | PRN

## 2013-11-18 MED ORDER — IBUPROFEN 800 MG PO TABS: 800.0000 mg | ORAL_TABLET | Freq: Three times a day (TID) | ORAL | Status: DC | PRN

## 2013-11-18 NOTE — ED Provider Notes (Signed)
CSN: [id number]     Arrival date & time 11/18/13  1201 History   First MD Initiated Contact with Patient 11/18/13 1609     Chief Complaint  Patient presents with  . Dizziness  . Knee Pain     (Consider location/radiation/quality/duration/timing/severity/associated sxs/prior Treatment) The history is provided by the patient.   Patient with hx of meniscal repair of right knee 1 year ago presents with pain after reinjuring the same knee.  States she was shopping and quickly tried to get out of the way of someone coming down the aisle.  Thinks that she twisted the knee and heard a pop.  Reports constant throbbing pain, worse with attempting to straighten leg and with turning toe inward.  Pain is located over the lateral and posterior knee.  She has taken nothing for the pain.  Denies fevers, weakness or numbness of the leg.  Prior surgery by Dr Noemi Chapel.  The injury occurred this morning.   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     Past Medical History  Diagnosis Date  . Asthma   . Rotator cuff tear   . Perimenopausal   . Slipped intervertebral disc   . Meniscus tear   . Chronic knee pain     right   Past Surgical History  Procedure Laterality Date  . Cesarean section    . Oophorectomy    . Tubal ligation    . Shoulder surgery     Family History  Problem Relation Age of Onset  . Cancer Mother     colon cancer  . Cancer Father     brain cancer  . Alcohol abuse Father   . Diabetes Father    History  Substance Use Topics  . Smoking status: Never Smoker   . Smokeless tobacco: Never Used  . Alcohol Use: No   OB History   Grav Para Term Preterm Abortions TAB SAB Ect Mult Living                 Review of Systems  Constitutional: Negative for fever and chills.  Musculoskeletal: Positive for arthralgias. Negative for gait  problem.  Skin: Negative for color change and wound.  Allergic/Immunologic: Negative for immunocompromised state.  Neurological: Negative for weakness and numbness.  Hematological: Does not bruise/bleed easily.      Allergies  Diclofenac sodium and Meloxicam  Home Medications   Current Outpatient Rx  Name  Route  Sig  Dispense  Refill  . acetaminophen (TYLENOL) 500 MG tablet   Oral   Take 500 mg by mouth every 6 (six) hours as needed.         Marland Kitchen albuterol (PROVENTIL HFA;VENTOLIN HFA) 108 (90 BASE) MCG/ACT inhaler   Inhalation   Inhale 2 puffs into the lungs every 6 (six) hours as needed. For wheezing         . ibuprofen (ADVIL,MOTRIN) 200 MG tablet   Oral   Take 400 mg by mouth every 6 (six) hours as needed.         . zolpidem (AMBIEN) 5 MG tablet   Oral   Take 5 mg by mouth at bedtime as needed for sleep.         Marland Kitchen HYDROcodone-acetaminophen (NORCO/VICODIN) 5-325 MG per tablet   Oral   Take 1 tablet by mouth every  4 (four) hours as needed.   15 tablet   0   . ibuprofen (ADVIL,MOTRIN) 800 MG tablet   Oral   Take 1 tablet (800 mg total) by mouth every 8 (eight) hours as needed.   15 tablet   0    BP 133/64  Pulse 71  Temp(Src) 97.3 F (36.3 C) (Oral)  Resp 16  SpO2 100%  LMP 09/27/2011 Physical Exam  Nursing note and vitals reviewed. Constitutional: She appears well-developed and well-nourished. No distress.  HENT:  Head: Normocephalic and atraumatic.  Neck: Neck supple.  Pulmonary/Chest: Effort normal.  Musculoskeletal:       Right knee: She exhibits abnormal meniscus. She exhibits normal range of motion, no swelling, no effusion, no ecchymosis, no deformity, no laceration, normal alignment, no LCL laxity and no MCL laxity. No tenderness found.  Pt unable to perform thessaly test on right secondary to reproducing pain.    Distal sensation and pulses intact.    Neurological: She is alert.  Skin: She is not diaphoretic.    ED Course  Procedures  (including critical care time) Labs Review Labs Reviewed - No data to display Imaging Review Dg Knee Complete 4 Views Right  11/18/2013   CLINICAL DATA:  Pain  EXAM: RIGHT KNEE - COMPLETE 4+ VIEW  COMPARISON:  August 10, 2013  FINDINGS: Frontal, lateral, and bilateral oblique views were obtained. No fracture, dislocation, or effusion. There is spurring in all compartments, stable. There is slight narrowing medially. No erosive change.  IMPRESSION: Relatively mild osteoarthritic change.  No fracture or effusion.   Electronically Signed   By: Lowella Grip M.D.   On: 11/18/2013 13:20     EKG Interpretation None      MDM   Final diagnoses:  Right knee pain    Pt with hx meniscal repair on right one year ago with continued mild pain and reinjury today that is likely also related to the meniscus.  Pt unable to tolerate knee immobilizer given her need to physically manage her autistic son so I put her in a knee sleeve.  She declined crutches.  D/C home with norco, ortho follow up.  Discussed result, findings, treatment, and follow up  with patient.  Pt given return precautions.  Pt verbalizes understanding and agrees with plan.        Clayton Bibles, PA-C 11/18/13 2121

## 2013-11-18 NOTE — ED Notes (Signed)
Pt had surgery on knee x 1 year ago.  Pt states coming down steps this am and felt dizzy.  Pt stated she felt she was going to fall and attempted to catch herself.  Pt felt a pop in her leg.    Pt states dizzines and weakness has been going on for 3 months.

## 2013-11-18 NOTE — Discharge Instructions (Signed)
Read the information below.  Use the prescribed medication as directed.  Please discuss all new medications with your pharmacist.  Do not take additional tylenol while taking the prescribed pain medication to avoid overdose.  You may return to the Emergency Department at any time for worsening condition or any new symptoms that concern you.  If you develop uncontrolled pain, weakness or numbness of the extremity, severe discoloration of the skin, or you are unable to walk or move your knee, return to the ER for a recheck.       Knee Pain Knee pain can be a result of an injury or other medical conditions. Treatment will depend on the cause of your pain. HOME CARE  Only take medicine as told by your doctor.  Keep a healthy weight. Being overweight can make the knee hurt more.  Stretch before exercising or playing sports.  If there is constant knee pain, change the way you exercise. Ask your doctor for advice.  Make sure shoes fit well. Choose the right shoe for the sport or activity.  Protect your knees. Wear kneepads if needed.  Rest when you are tired. GET HELP RIGHT AWAY IF:   Your knee pain does not stop.  Your knee pain does not get better.  Your knee joint feels hot to the touch.  You have a fever. MAKE SURE YOU:   Understand these instructions.  Will watch this condition.  Will get help right away if you are not doing well or get worse. Document Released: 11/30/2008 Document Revised: 11/26/2011 Document Reviewed: 11/30/2008 [facility] Patient Information 2014 [place], [place].

## 2013-11-20 NOTE — ED Provider Notes (Signed)
Medical screening examination/treatment/procedure(s) were performed by non-physician practitioner and as supervising physician I was immediately available for consultation/collaboration.   EKG Interpretation None        Ephraim Hamburger, MD 11/20/13 1409

## 2014-08-17 ENCOUNTER — Emergency Department (HOSPITAL_COMMUNITY): Payer: Medicaid Other

## 2014-08-17 ENCOUNTER — Emergency Department (HOSPITAL_COMMUNITY)
Admission: EM | Admit: 2014-08-17 | Discharge: 2014-08-17 | Disposition: A | Discharge status: Home / Self Care | Payer: Medicaid Other | Attending: Emergency Medicine | Admitting: Emergency Medicine

## 2014-08-17 ENCOUNTER — Encounter (HOSPITAL_COMMUNITY): Payer: Self-pay

## 2014-08-17 DIAGNOSIS — Y92511 Restaurant or cafe as the place of occurrence of the external cause: Secondary | ICD-10-CM | POA: Diagnosis not present

## 2014-08-17 DIAGNOSIS — S3992XA Unspecified injury of lower back, initial encounter: Secondary | ICD-10-CM | POA: Diagnosis not present

## 2014-08-17 DIAGNOSIS — Z8742 Personal history of other diseases of the female genital tract: Secondary | ICD-10-CM | POA: Diagnosis not present

## 2014-08-17 DIAGNOSIS — Z9889 Other specified postprocedural states: Secondary | ICD-10-CM | POA: Diagnosis not present

## 2014-08-17 DIAGNOSIS — J45909 Unspecified asthma, uncomplicated: Secondary | ICD-10-CM | POA: Insufficient documentation

## 2014-08-17 DIAGNOSIS — Y9389 Activity, other specified: Secondary | ICD-10-CM | POA: Diagnosis not present

## 2014-08-17 DIAGNOSIS — S4991XA Unspecified injury of right shoulder and upper arm, initial encounter: Secondary | ICD-10-CM | POA: Insufficient documentation

## 2014-08-17 DIAGNOSIS — X58XXXA Exposure to other specified factors, initial encounter: Secondary | ICD-10-CM | POA: Insufficient documentation

## 2014-08-17 DIAGNOSIS — Y998 Other external cause status: Secondary | ICD-10-CM | POA: Insufficient documentation

## 2014-08-17 DIAGNOSIS — Z79899 Other long term (current) drug therapy: Secondary | ICD-10-CM | POA: Insufficient documentation

## 2014-08-17 DIAGNOSIS — M069 Rheumatoid arthritis, unspecified: Secondary | ICD-10-CM | POA: Insufficient documentation

## 2014-08-17 DIAGNOSIS — Z8739 Personal history of other diseases of the musculoskeletal system and connective tissue: Secondary | ICD-10-CM | POA: Insufficient documentation

## 2014-08-17 DIAGNOSIS — G8929 Other chronic pain: Secondary | ICD-10-CM | POA: Diagnosis not present

## 2014-08-17 DIAGNOSIS — M25511 Pain in right shoulder: Secondary | ICD-10-CM

## 2014-08-17 HISTORY — DX: Rheumatoid arthritis, unspecified: M06.9

## 2014-08-17 MED ORDER — IBUPROFEN 800 MG PO TABS
800.0000 mg | ORAL_TABLET | Freq: Once | ORAL | Status: AC
  Administered 2014-08-17: 800 mg via ORAL
  Filled 2014-08-17: qty 1

## 2014-08-17 MED ORDER — HYDROCODONE-ACETAMINOPHEN 5-325 MG PO TABS: 1.0000 | ORAL_TABLET | ORAL | Status: DC | PRN

## 2014-08-17 NOTE — ED Notes (Signed)
Initial Contact - pt A+Ox4, reports c/o 8/10 pain to R shoulder/arm/mid-upper back after restraining her son with autism yesterday.  No obvious deformities/swelling noted.  +csm/+pulses.  Skin PWD.  MAEI.  Speaking full/clear sentences, rr even/un-lab.  NAD.

## 2014-08-17 NOTE — ED Notes (Signed)
Pt to radiology.

## 2014-08-17 NOTE — ED Notes (Signed)
Per pt, was restraining her son with special needs yesterday.  Pulled muscle in back and arm.

## 2014-08-17 NOTE — Discharge Instructions (Signed)
Acromioclavicular Injuries °The AC (acromioclavicular) joint is the joint in the shoulder where the collarbone (clavicle) meets the shoulder blade (scapula). The part of the shoulder blade connected to the collarbone is called the acromion. Common problems with and treatments for the AC joint are detailed below. °ARTHRITIS °Arthritis occurs when the joint has been injured and the smooth padding between the joints (cartilage) is lost. This is the wear and tear seen in most joints of the body if they have been overused. This causes the joint to produce pain and swelling which is worse with activity.  °AC JOINT SEPARATION °AC joint separation means that the ligaments connecting the acromion of the shoulder blade and collarbone have been damaged, and the two bones no longer line up. AC separations can be anywhere from mild to severe, and are "graded" depending upon which ligaments are torn and how badly they are torn. °· Grade I Injury: the least damage is done, and the AC joint still lines up. °· Grade II Injury: damage to the ligaments which reinforce the AC joint. In a Grade II injury, these ligaments are stretched but not entirely torn. When stressed, the AC joint becomes painful and unstable. °· Grade III Injury: AC and secondary ligaments are completely torn, and the collarbone is no longer attached to the shoulder blade. This results in deformity; a prominence of the end of the clavicle. °AC JOINT FRACTURE °AC joint fracture means that there has been a break in the bones of the AC joint, usually the end of the clavicle. °TREATMENT °TREATMENT OF AC ARTHRITIS °· There is currently no way to replace the cartilage damaged by arthritis. The best way to improve the condition is to decrease the activities which aggravate the problem. Application of ice to the joint helps decrease pain and soreness (inflammation). The use of non-steroidal anti-inflammatory medication is helpful. °· If less conservative measures do not  work, then cortisone shots (injections) may be used. These are anti-inflammatories; they decrease the soreness in the joint and swelling. °· If non-surgical measures fail, surgery may be recommended. The procedure is generally removal of a portion of the end of the clavicle. This is the part of the collarbone closest to your acromion which is stabilized with ligaments to the acromion of the shoulder blade. This surgery may be performed using a tube-like instrument with a light (arthroscope) for looking into a joint. It may also be performed as an open surgery through a small incision by the surgeon. Most patients will have good range of motion within 6 weeks and may return to all activity including sports by 8-12 weeks, barring complications. °TREATMENT OF AN AC SEPARATION °· The initial treatment is to decrease pain. This is best accomplished by immobilizing the arm in a sling and placing an ice pack to the shoulder for 20 to 30 minutes every 2 hours as needed. As the pain starts to subside, it is important to begin moving the fingers, wrist, elbow and eventually the shoulder in order to prevent a stiff or "frozen" shoulder. Instruction on when and how much to move the shoulder will be provided by your caregiver. The length of time needed to regain full motion and function depends on the amount or grade of the injury. Recovery from a Grade I AC separation usually takes 10 to 14 days, whereas a Grade III may take 6 to 8 weeks. °· Grade I and II separations usually do not require surgery. Even Grade III injuries usually allow return to full   activity with few restrictions. Treatment is also based on the activity demands of the injured shoulder. For example, a high level quarterback with an injured throwing arm will receive more aggressive treatment than someone with a desk job who rarely uses his/her arm for strenuous activities. In some cases, a painful lump may persist which could require a later surgery. Surgery  can be very successful, but the benefits must be weighed against the potential risks. °TREATMENT OF AN AC JOINT FRACTURE °Fracture treatment depends on the type of fracture. Sometimes a splint or sling may be all that is required. Other times surgery may be required for repair. This is more frequently the case when the ligaments supporting the clavicle are completely torn. Your caregiver will help you with these decisions and together you can decide what will be the best treatment. °HOME CARE INSTRUCTIONS  °· Apply ice to the injury for 15-20 minutes each hour while awake for 2 days. Put the ice in a plastic bag and place a towel between the bag of ice and skin. °· If a sling has been applied, wear it constantly for as long as directed by your caregiver, even at night. The sling or splint can be removed for bathing or showering or as directed. Be sure to keep the shoulder in the same place as when the sling is on. Do not lift the arm. °· If a figure-of-eight splint has been applied it should be tightened gently by another person every day. Tighten it enough to keep the shoulders held back. Allow enough room to place the index finger between the body and strap. Loosen the splint immediately if there is numbness or tingling in the hands. °· Take over-the-counter or prescription medicines for pain, discomfort or fever as directed by your caregiver. °· If you or your child has received a follow up appointment, it is very important to keep that appointment in order to avoid long term complications, chronic pain or disability. °SEEK MEDICAL CARE IF:  °· The pain is not relieved with medications. °· There is increased swelling or discoloration that continues to get worse rather than better. °· You or your child has been unable to follow up as instructed. °· There is progressive numbness and tingling in the arm, forearm or hand. °SEEK IMMEDIATE MEDICAL CARE IF:  °· The arm is numb, cold or pale. °· There is increasing pain  in the hand, forearm or fingers. °MAKE SURE YOU:  °· Understand these instructions. °· Will watch your condition. °· Will get help right away if you are not doing well or get worse. °Document Released: 06/13/2005 Document Revised: 11/26/2011 Document Reviewed: 12/06/2008 °[employer]® Patient Information ©2015 [employer]. This information is not intended to replace advice given to you by your health care provider. Make sure you discuss any questions you have with your health care provider. ° °

## 2014-08-17 NOTE — ED Notes (Signed)
Bed: WA19 Expected date:  Expected time:  Means of arrival:  Comments: 

## 2014-08-17 NOTE — ED Provider Notes (Signed)
CSN: [id number]     Arrival date & time 08/17/14  0865 History   First MD Initiated Contact with Patient 08/17/14 [phone]     Chief Complaint  Patient presents with  . Back Pain  . Arm Pain     (Consider location/radiation/quality/duration/timing/severity/associated sxs/prior Treatment) HPI Pt is a 48yo female with hx of right rotator cuff surgery about 1 year ago by Dr. Noemi Chapel, presenting to ED with c/o right shoulder pain after she attempted to restrain her autistic son during an "outburst" yesterday while at a restaurant after school.  Pt states pain is gradually worsening, aching and sore, 8/10 at worst, worse with certain movements, decreased ROM due to severe pain.  Pt is concerned as she needs to be able to use her right arm to help assist with her autistic son at home.  Denies any other injuries. No pain medication PTA.  Denies numbness or tingling in right arm.  Pt is right hand dominant.    Past Medical History  Diagnosis Date  . Asthma   . Rotator cuff tear   . Perimenopausal   . Slipped intervertebral disc   . Meniscus tear   . Chronic knee pain     right  . Rheumatoid arthritis    Past Surgical History  Procedure Laterality Date  . Cesarean section    . Oophorectomy    . Tubal ligation    . Shoulder surgery     Family History  Problem Relation Age of Onset  . Cancer Mother     colon cancer  . Cancer Father     brain cancer  . Alcohol abuse Father   . Diabetes Father    History  Substance Use Topics  . Smoking status: Never Smoker   . Smokeless tobacco: Never Used  . Alcohol Use: No   OB History    No data available     Review of Systems  Musculoskeletal: Positive for myalgias, back pain and arthralgias. Negative for joint swelling, gait problem, neck pain and neck stiffness.       Right shoulder  Skin: Negative for color change and wound.  Neurological: Positive for weakness ( right arm due to pain ). Negative for numbness.  All other systems reviewed  and are negative.     Allergies  Diclofenac sodium and Meloxicam  Home Medications   Prior to Admission medications   Medication Sig Start Date End Date Taking? Authorizing Provider  albuterol (PROVENTIL HFA;VENTOLIN HFA) 108 (90 BASE) MCG/ACT inhaler Inhale 2 puffs into the lungs every 6 (six) hours as needed for wheezing or shortness of breath. For wheezing   Yes Historical Provider, MD  cholecalciferol (VITAMIN D) 1000 UNITS tablet Take 1,000 Units by mouth daily with breakfast.   Yes Historical Provider, MD  ibuprofen (ADVIL,MOTRIN) 200 MG tablet Take 600 mg by mouth every 6 (six) hours as needed for headache or moderate pain.    Yes Historical Provider, MD  zolpidem (AMBIEN) 10 MG tablet Take 5 mg by mouth at bedtime as needed for sleep.   Yes Historical Provider, MD  HYDROcodone-acetaminophen (NORCO/VICODIN) 5-325 MG per tablet Take 1 tablet by mouth every 4 (four) hours as needed. 08/17/14   Noland Fordyce, PA-C  ibuprofen (ADVIL,MOTRIN) 800 MG tablet Take 1 tablet (800 mg total) by mouth every 8 (eight) hours as needed. Patient not taking: Reported on 08/17/2014 11/18/13   Clayton Bibles, PA-C   BP 142/78 mmHg  Pulse 87  Temp(Src) 97.5 F (36.4 C) (Oral)  Resp 16  SpO2 98%  LMP 09/27/2011 Physical Exam  Constitutional: She is oriented to person, place, and time. She appears well-developed and well-nourished.  HENT:  Head: Normocephalic and atraumatic.  Eyes: EOM are normal.  Neck: Normal range of motion. Neck supple.  No midline bone tenderness, no crepitus or step-offs.   Cardiovascular: Normal rate.   Right hand: cap refill < 3 seconds  Pulmonary/Chest: Effort normal.  Musculoskeletal: She exhibits tenderness.  Right shoulder: well healed surgical scars. Tenderness to anterior aspect right shoulder w/o obvious deformity.  Limited active and passive ROM with abduction, flexion and extension of right shoulder due to pain.  FROM right elbow w/o tenderness. 5/5 grip strength  bilaterally.   Neurological: She is alert and oriented to person, place, and time.  Right arm: sensation to light and sharp touch in tact.  Skin: Skin is warm and dry.  Skin in tact. No ecchymosis, erythema, or warmth. No red streaking, induration, or evidence of underlying infection  Psychiatric: She has a normal mood and affect. Her behavior is normal.  Nursing note and vitals reviewed.   ED Course  Procedures (including critical care time) Labs Review Labs Reviewed - No data to display  Imaging Review Dg Shoulder Right  08/17/2014   CLINICAL DATA:  Pain.  EXAM: RIGHT SHOULDER - 2+ VIEW  COMPARISON:  08/10/2013  FINDINGS: There is no fracture or dislocation. Minimal inferior osteophytes on the humeral head. Small erosions of the distal clavicle. No soft tissue calcifications.  IMPRESSION: No acute abnormalities.  Slight degenerative changes.   Electronically Signed   By: Rozetta Nunnery M.D.   On: 08/17/2014 11:11     EKG Interpretation None      MDM   Final diagnoses:  Right shoulder pain  History of rotator cuff surgery    Pt c/o right shoulder pain after restraining her autistic son yesterday during an "outburst" no other injuries. Pt hx has of rotator cuff repair on right shoulder by Dr. Noemi Chapel.  On exam, pt is tender in anterior aspect, decreased ROM, but neurovascularly in tact.  Plain films: no acute abnormaltiies. Slight degenerative changes.  Discussed imaging with pt, advised on limitations of plain films.  Encouraged pt to use shoulder mobility exercises she used s/p RCT surgery last year. Will discharge home with norco. Advised to also use ibuprofen and alternate cool and warm packs. Pt is to f/u with Dr. Noemi Chapel if not improving in 1-2 weeks for further evaluation and treatment. Pt verbalized understanding and agreement with tx plan.     Noland Fordyce, PA-C 08/17/14 [place], MD 08/17/14 [phone]

## 2014-11-15 ENCOUNTER — Emergency Department (HOSPITAL_COMMUNITY): Payer: Medicaid Other

## 2014-11-15 ENCOUNTER — Encounter (HOSPITAL_COMMUNITY): Payer: Self-pay

## 2014-11-15 ENCOUNTER — Emergency Department (HOSPITAL_COMMUNITY)
Admission: EM | Admit: 2014-11-15 | Discharge: 2014-11-15 | Disposition: A | Discharge status: Home / Self Care | Payer: Medicaid Other | Attending: Emergency Medicine | Admitting: Emergency Medicine

## 2014-11-15 DIAGNOSIS — Y93F9 Activity, other caregiving: Secondary | ICD-10-CM | POA: Insufficient documentation

## 2014-11-15 DIAGNOSIS — Y92481 Parking lot as the place of occurrence of the external cause: Secondary | ICD-10-CM | POA: Insufficient documentation

## 2014-11-15 DIAGNOSIS — G8929 Other chronic pain: Secondary | ICD-10-CM | POA: Insufficient documentation

## 2014-11-15 DIAGNOSIS — W1839XA Other fall on same level, initial encounter: Secondary | ICD-10-CM | POA: Insufficient documentation

## 2014-11-15 DIAGNOSIS — W19XXXA Unspecified fall, initial encounter: Secondary | ICD-10-CM

## 2014-11-15 DIAGNOSIS — S3992XA Unspecified injury of lower back, initial encounter: Secondary | ICD-10-CM | POA: Diagnosis not present

## 2014-11-15 DIAGNOSIS — J45909 Unspecified asthma, uncomplicated: Secondary | ICD-10-CM | POA: Diagnosis not present

## 2014-11-15 DIAGNOSIS — S59902A Unspecified injury of left elbow, initial encounter: Secondary | ICD-10-CM | POA: Diagnosis not present

## 2014-11-15 DIAGNOSIS — Y998 Other external cause status: Secondary | ICD-10-CM | POA: Diagnosis not present

## 2014-11-15 DIAGNOSIS — M25522 Pain in left elbow: Secondary | ICD-10-CM

## 2014-11-15 DIAGNOSIS — Z79899 Other long term (current) drug therapy: Secondary | ICD-10-CM | POA: Insufficient documentation

## 2014-11-15 DIAGNOSIS — Z8742 Personal history of other diseases of the female genital tract: Secondary | ICD-10-CM | POA: Insufficient documentation

## 2014-11-15 DIAGNOSIS — M545 Low back pain, unspecified: Secondary | ICD-10-CM

## 2014-11-15 DIAGNOSIS — M069 Rheumatoid arthritis, unspecified: Secondary | ICD-10-CM | POA: Diagnosis not present

## 2014-11-15 MED ORDER — METHOCARBAMOL 500 MG PO TABS: 500.0000 mg | ORAL_TABLET | Freq: Two times a day (BID) | ORAL | Status: DC

## 2014-11-15 NOTE — ED Notes (Signed)
Per pt, fell on Friday at [employer] while helping autistic son.  Having back pain and left elbow pain.  Using meds at home with head and having no relief.

## 2014-11-15 NOTE — ED Provider Notes (Signed)
CSN: [id number]     Arrival date & time 11/15/14  0800 History   First MD Initiated Contact with Patient 11/15/14 0809     Chief Complaint  Patient presents with  . Back Pain  . Elbow Pain     (Consider location/radiation/quality/duration/timing/severity/associated sxs/prior Treatment) HPI Comments: Patient presents to emergency department with a chief complaint of low back pain and left elbow pain. Patient states that she fell while trying to restrain her autistic son in the [employer] parking lot. She states that this happened on Friday of last week. She states that she is still having low back pain and left elbow pain. She states pain is worsened with movement and palpation. She states that she has tried using OTC medicines with no relief. She wants to be certain that she did not break anything.  The history is provided by the patient. No language interpreter was used.    Past Medical History  Diagnosis Date  . Asthma   . Rotator cuff tear   . Perimenopausal   . Slipped intervertebral disc   . Meniscus tear   . Chronic knee pain     right  . Rheumatoid arthritis    Past Surgical History  Procedure Laterality Date  . Cesarean section    . Oophorectomy    . Tubal ligation    . Shoulder surgery     Family History  Problem Relation Age of Onset  . Cancer Mother     colon cancer  . Cancer Father     brain cancer  . Alcohol abuse Father   . Diabetes Father    History  Substance Use Topics  . Smoking status: Never Smoker   . Smokeless tobacco: Never Used  . Alcohol Use: No   OB History    No data available     Review of Systems  Constitutional: Negative for fever and chills.  Respiratory: Negative for shortness of breath.   Cardiovascular: Negative for chest pain.  Gastrointestinal: Negative for nausea, vomiting, diarrhea and constipation.  Genitourinary: Negative for dysuria.  Musculoskeletal: Positive for back pain and arthralgias.  All other systems reviewed and  are negative.     Allergies  Diclofenac sodium and Meloxicam  Home Medications   Prior to Admission medications   Medication Sig Start Date End Date Taking? Authorizing Provider  albuterol (PROVENTIL HFA;VENTOLIN HFA) 108 (90 BASE) MCG/ACT inhaler Inhale 2 puffs into the lungs every 6 (six) hours as needed for wheezing or shortness of breath. For wheezing   Yes Historical Provider, MD  cholecalciferol (VITAMIN D) 1000 UNITS tablet Take 1,000 Units by mouth daily with breakfast.   Yes Historical Provider, MD  ibuprofen (ADVIL,MOTRIN) 200 MG tablet Take 800 mg by mouth every 6 (six) hours as needed for headache or moderate pain.    Yes Historical Provider, MD  zolpidem (AMBIEN) 10 MG tablet Take 5 mg by mouth at bedtime as needed for sleep.   Yes Historical Provider, MD  HYDROcodone-acetaminophen (NORCO/VICODIN) 5-325 MG per tablet Take 1 tablet by mouth every 4 (four) hours as needed. Patient not taking: Reported on 11/15/2014 08/17/14   Noland Fordyce, PA-C  ibuprofen (ADVIL,MOTRIN) 800 MG tablet Take 1 tablet (800 mg total) by mouth every 8 (eight) hours as needed. Patient not taking: Reported on 08/17/2014 11/18/13   Clayton Bibles, PA-C   BP 152/72 mmHg  Pulse 76  Temp(Src) 97.8 F (36.6 C) (Oral)  Resp 18  SpO2 99%  LMP 09/27/2011 Physical Exam  Constitutional:  She is oriented to person, place, and time. She appears well-developed and well-nourished.  HENT:  Head: Normocephalic and atraumatic.  Eyes: Conjunctivae and EOM are normal. Pupils are equal, round, and reactive to light.  Neck: Normal range of motion. Neck supple.  Cardiovascular: Normal rate and regular rhythm.  Exam reveals no gallop and no friction rub.   No murmur heard. Pulmonary/Chest: Effort normal and breath sounds normal. No respiratory distress. She has no wheezes. She has no rales. She exhibits no tenderness.  Abdominal: Soft. She exhibits no distension.  Musculoskeletal: Normal range of motion. She exhibits no  edema or tenderness.  Left elbow mildly tender to palpation, no bony abnormality or deformity, range of motion and strength 5/5  No CTLS spine tenderness, step-off, or deformity, ambulatory  Neurological: She is alert and oriented to person, place, and time.  Skin: Skin is warm and dry.  Psychiatric: She has a normal mood and affect. Her behavior is normal. Judgment and thought content normal.  Nursing note and vitals reviewed.   ED Course  Procedures (including critical care time) Labs Review Labs Reviewed - No data to display  Imaging Review Dg Lumbar Spine Complete  11/15/2014   CLINICAL DATA:  Low back pain since a fall 3 days ago.  Stiffness.  EXAM: LUMBAR SPINE - COMPLETE 4+ VIEW  COMPARISON:  08/10/2013  FINDINGS: There is no fracture, subluxation, disc space narrowing, or bone destruction. Slight degenerative changes of the facet joints at L4-5 and L5-S1 on the right and at L4-5 on the left.  IMPRESSION: No acute abnormalities. 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   Electronically Signed   By: Lorriane Shire M.D.   On: 11/15/2014 08:56   Dg Elbow Complete Left  11/15/2014   CLINICAL DATA:  Right elbow pain with painful range of motion since a fall 3 days ago.  EXAM: LEFT ELBOW - COMPLETE 3+ VIEW  COMPARISON:  None.  FINDINGS: There is no fracture or dislocation or joint effusion. There is a small detached osteophyte at the tip of the coronoid process of the proximal ulna.  IMPRESSION: No acute abnormality. Slight degenerative changes of the proximal ulna.   Electronically Signed   By: Lorriane Shire M.D.   On: 11/15/2014 08:58     EKG Interpretation None      MDM   Final diagnoses:  Fall, initial encounter  Midline low back pain without sciatica  Left elbow pain    Patient with mechanical fall on Friday. She complains of left elbow and low back pain. Imaging is negative. Will give muscle relaxer for muscle strain. Recommend primary care follow-up. OTC  medications. Patient is stable and ready for discharge.    Montine Circle, PA-C 11/15/14 [id number]  Ernestina Patches, MD 11/16/14 [phone]

## 2014-11-15 NOTE — Discharge Instructions (Signed)

## 2015-01-28 ENCOUNTER — Telehealth (HOSPITAL_COMMUNITY): Payer: Self-pay | Admitting: Family Medicine

## 2015-01-28 ENCOUNTER — Encounter (HOSPITAL_COMMUNITY): Payer: Self-pay | Admitting: Emergency Medicine

## 2015-01-28 ENCOUNTER — Emergency Department (HOSPITAL_COMMUNITY)
Admission: EM | Admit: 2015-01-28 | Discharge: 2015-01-28 | Disposition: A | Discharge status: Home / Self Care | Payer: Medicaid Other | Source: Home / Self Care | Attending: Family Medicine | Admitting: Family Medicine

## 2015-01-28 DIAGNOSIS — M25561 Pain in right knee: Secondary | ICD-10-CM | POA: Diagnosis not present

## 2015-01-28 DIAGNOSIS — R5383 Other fatigue: Secondary | ICD-10-CM

## 2015-01-28 LAB — CBC
HCT: 38.2 % (ref 36.0–46.0)
Hemoglobin: 12.8 g/dL (ref 12.0–15.0)
MCH: 31.2 pg (ref 26.0–34.0)
MCHC: 33.5 g/dL (ref 30.0–36.0)
MCV: 93.2 fL (ref 78.0–100.0)
Platelets: 266 10*3/uL (ref 150–400)
RBC: 4.1 MIL/uL (ref 3.87–5.11)
RDW: 12.3 % (ref 11.5–15.5)
WBC: 4.9 10*3/uL (ref 4.0–10.5)

## 2015-01-28 LAB — COMPREHENSIVE METABOLIC PANEL
ALBUMIN: 3.9 g/dL (ref 3.5–5.0)
ALK PHOS: 70 U/L (ref 38–126)
ALT: 15 U/L (ref 14–54)
AST: 17 U/L (ref 15–41)
Anion gap: 8 (ref 5–15)
BUN: 10 mg/dL (ref 6–20)
CHLORIDE: 107 mmol/L (ref 101–111)
CO2: 26 mmol/L (ref 22–32)
CREATININE: 0.82 mg/dL (ref 0.44–1.00)
Calcium: 9.3 mg/dL (ref 8.9–10.3)
Glucose, Bld: 166 mg/dL — ABNORMAL HIGH (ref 65–99)
Potassium: 4.1 mmol/L (ref 3.5–5.1)
SODIUM: 141 mmol/L (ref 135–145)
Total Bilirubin: 0.7 mg/dL (ref 0.3–1.2)
Total Protein: 7.7 g/dL (ref 6.5–8.1)

## 2015-01-28 LAB — TSH: TSH: 0.859 u[IU]/mL (ref 0.350–4.500)

## 2015-01-28 MED ORDER — TRAMADOL HCL 50 MG PO TABS: 50.0000 mg | ORAL_TABLET | Freq: Four times a day (QID) | ORAL | Status: DC | PRN

## 2015-01-28 NOTE — ED Provider Notes (Signed)
[name] is a 49 y.o. female who presents to Urgent Care today for right knee pain and fatigue. Patient has a history of meniscus tear in her right knee that was treated with arthroscopic partial meniscectomy. She was in her normal state of health when she was walking down stairs about 3 weeks ago and felt a pop in her knee. She notes pain and swelling since. The pain is worse with activity and better with rest. She's tried heating, ice, ibuprofen, and rest which have only helped a little. No radiating pain weakness or numbness fevers or chills.    Additionally patient notes chronic fatigue worsening over the past several weeks. She feels very tired and run down. She has significant demands at home with an autistic child. She is a single mother. She is transitioning between primary care providers. She denies any chest pains or palpitations. She has a sister who is in remission from leukemia.   Past Medical History  Diagnosis Date  . Asthma   . Rotator cuff tear   . Perimenopausal   . Slipped intervertebral disc   . Meniscus tear   . Chronic knee pain     right  . Rheumatoid arthritis    Past Surgical History  Procedure Laterality Date  . Cesarean section    . Oophorectomy    . Tubal ligation    . Shoulder surgery     History  Substance Use Topics  . Smoking status: Never Smoker   . Smokeless tobacco: Never Used  . Alcohol Use: No   ROS as above Medications: No current facility-administered medications for this encounter.   Current Outpatient Prescriptions  Medication Sig Dispense Refill  . albuterol (PROVENTIL HFA;VENTOLIN HFA) 108 (90 BASE) MCG/ACT inhaler Inhale 2 puffs into the lungs every 6 (six) hours as needed for wheezing or shortness of breath. For wheezing    . cholecalciferol (VITAMIN D) 1000 UNITS tablet Take 1,000 Units by mouth daily with breakfast.    . methocarbamol (ROBAXIN) 500 MG tablet Take 1 tablet (500 mg total) by mouth 2 (two) times daily. 20 tablet  0  . traMADol (ULTRAM) 50 MG tablet Take 1 tablet (50 mg total) by mouth every 6 (six) hours as needed. 15 tablet 0  . zolpidem (AMBIEN) 10 MG tablet Take 5 mg by mouth at bedtime as needed for sleep.     Allergies  Allergen Reactions  . Diclofenac Sodium Itching  . Meloxicam Itching     Exam:  BP 140/78 mmHg  Pulse 80  Temp(Src) 98.2 F (36.8 C) (Oral)  Resp 16  SpO2 98%  LMP 09/27/2011 Gen: Well NAD HEENT: EOMI,  MMM Lungs: Normal work of breathing. CTABL Heart: RRR no MRG Abd: NABS, Soft. Nondistended, Nontender Exts: Brisk capillary refill, warm and well perfused.  Knee: Right knee normal-appearing minimal effusion Range of motion 0-120 Tender to palpation posterior lateral corner joint line. Stable and pain-free valgus and varus stress. Stable Lachman's and anterior drawer. Positive for right lateral McMurray's test.  Left knee normal-appearing nontender normal motion stable ligamentous exam  No results found for this or any previous visit (from the past 24 hour(s)). No results found.  Assessment and Plan: 49 y.o. female with  1) right knee pain: Likely lateral meniscus injury. Offered corticosteroid injection. Patient declined at this time. Follow-up with Dr. Alfonso Ramus at [place] for further evaluation. Treat pain with limited amount of tramadol. 2) fatigue. Likely related to either depression or sleep apnea. However will  obtain TSH, CBC, CMP and call patient with results if abnormal. Follow-up with PCP.  Discussed warning signs or symptoms. Please see discharge instructions. Patient expresses understanding.     Gregor Hams, MD 01/28/15 [phone]

## 2015-01-28 NOTE — ED Notes (Signed)
Patient c/o right knee pain x 3 weeks. She reports she was restraining her special needs son and felt a pop. Patient has hx of torn meniscus. Patient is in NAD. She also c/o fatigue and just not feeling like herself. Patient has been using ibuprofen, tylenol and ice with mild relief.

## 2015-01-28 NOTE — Discharge Instructions (Signed)
Thank you for coming in today. Follow-up with Dr. Alfonso Ramus for your knee. I suspect she has a lateral meniscus tear. Follow-up with your primary care doctor for fatigue. I will call you if any. Labs come back abnormal.   Knee, Cartilage (Meniscus) Injury It is suspected that you have a torn cartilage (meniscus) in your knee. The menisci are made of tough cartilage and fit between the surfaces of the thigh and leg bones. The menisci are C-shaped and have a wedged profile. The wedged profile helps the stability of the joint by keeping the rounded femur surface from sliding off the flat tibial surface. The menisci are fed (nourished) by small blood vessels, but there is also a large area at the inner edge of the meniscus that does not have a good blood supply (avascular). This presents a problem when there is an injury to the meniscus because areas without good blood supply heal poorly. As a result when there is a torn cartilage in the knee, surgery is often required to fix it. This is usually done with a surgical procedure less invasive than open surgery (arthroscopy). Some times open surgery of the knee is required if there is other damage. PURPOSE OF THE MENISCUS The medial meniscus rests on the medial tibial plateau. The tibia is the large bone in your lower leg (the shin bone). The medial tibial plateau is the upper end of the bone making up the inner part of your knee. The lateral meniscus serves the same purpose and is located on the outside of the knee. The menisci help to distribute your body weight across the knee joint; they act as shock absorbers. Without the meniscus present, the weight of your body would be unevenly applied to the bones in your legs (the femur and tibia). The femur is the large bone in your thigh. This uneven weight distribution would cause increased wear and tear on the cartilage lining the joint surfaces, leading to early damage (arthritis) of these areas. The presence of the  menisci cartilage is necessary for a healthy knee. PURPOSE OF THE KNEE CARTILAGE The knee joint is made up of three bones: the thigh bone (femur), the shin bone (tibia), and the knee cap (patella). The surfaces of these bones at the knee joint are covered with cartilage called articular cartilage. This smooth, slippery surface allows the bones to slide against each other without causing bone damage. The meniscus sits between these cartilaginous surfaces of the bones. It distributes the weight evenly in the joints and helps with the stability of the joint (keeps the joint steady). HOME CARE INSTRUCTIONS  Use crutches and external braces as instructed.  Once home, an ice pack applied to your injured knee may help with discomfort and keep the swelling down. An ice pack can be used for the first couple of days or as instructed.  Only take over-the-counter  or prescription medicines for pain, discomfort, or fever as directed by your caregiver.  Call if you do not have relief of pain with medications or if there is increasing in pain.  Call if your foot becomes cold or blue.  You may resume normal diet and activities as directed.  Make sure to keep your appointments with your follow-up caregiver. This injury may require further evaluation and treatment beyond the temporary treatment given today. Document Released: 11/24/2002 Document Revised: 01/18/2014 Document Reviewed: 03/18/2009   Fatigue Fatigue is a feeling of tiredness, lack of energy, lack of motivation, or feeling tired all the time.  Having enough rest, good nutrition, and reducing stress will normally reduce fatigue. Consult your caregiver if it persists. The nature of your fatigue will help your caregiver to find out its cause. The treatment is based on the cause.  CAUSES  There are many causes for fatigue. Most of the time, fatigue can be traced to one or more of your habits or routines. Most causes fit into one or more of three  general areas. They are: Lifestyle problems  Sleep disturbances.  Overwork.  Physical exertion.  Unhealthy habits.  Poor eating habits or eating disorders.  Alcohol and/or drug use .  Lack of proper nutrition (malnutrition). Psychological problems  Stress and/or anxiety problems.  Depression.  Grief.  Boredom. Medical Problems or Conditions  Anemia.  Pregnancy.  Thyroid gland problems.  Recovery from major surgery.  Continuous pain.  Emphysema or asthma that is not well controlled  Allergic conditions.  Diabetes.  Infections (such as mononucleosis).  Obesity.  Sleep disorders, such as sleep apnea.  Heart failure or other heart-related problems.  Cancer.  Kidney disease.  Liver disease.  Effects of certain medicines such as antihistamines, cough and cold remedies, prescription pain medicines, heart and blood pressure medicines, drugs used for treatment of cancer, and some antidepressants. SYMPTOMS  The symptoms of fatigue include:   Lack of energy.  Lack of drive (motivation).  Drowsiness.  Feeling of indifference to the surroundings. DIAGNOSIS  The details of how you feel help guide your caregiver in finding out what is causing the fatigue. You will be asked about your present and past health condition. It is important to review all medicines that you take, including prescription and non-prescription items. A thorough exam will be done. You will be questioned about your feelings, habits, and normal lifestyle. Your caregiver may suggest blood tests, urine tests, or other tests to look for common medical causes of fatigue.  TREATMENT  Fatigue is treated by correcting the underlying cause. For example, if you have continuous pain or depression, treating these causes will improve how you feel. Similarly, adjusting the dose of certain medicines will help in reducing fatigue.  HOME CARE INSTRUCTIONS   Try to get the required amount of good sleep  every night.  Eat a healthy and nutritious diet, and drink enough water throughout the day.  Practice ways of relaxing (including yoga or meditation).  Exercise regularly.  Make plans to change situations that cause stress. Act on those plans so that stresses decrease over time. Keep your work and personal routine reasonable.  Avoid street drugs and minimize use of alcohol.  Start taking a daily multivitamin after consulting your caregiver. SEEK MEDICAL CARE IF:   You have persistent tiredness, which cannot be accounted for.  You have fever.  You have unintentional weight loss.  You have headaches.  You have disturbed sleep throughout the night.  You are feeling sad.  You have constipation.  You have dry skin.  You have gained weight.  You are taking any new or different medicines that you suspect are causing fatigue.  You are unable to sleep at night.  You develop any unusual swelling of your legs or other parts of your body. SEEK IMMEDIATE MEDICAL CARE IF:   You are feeling confused.  Your vision is blurred.  You feel faint or pass out.  You develop severe headache.  You develop severe abdominal, pelvic, or back pain.  You develop chest pain, shortness of breath, or an irregular or fast heartbeat.  You  are unable to pass a normal amount of urine.  You develop abnormal bleeding such as bleeding from the rectum or you vomit blood.  You have thoughts about harming yourself or committing suicide.  You are worried that you might harm someone else. MAKE SURE YOU:   Understand these instructions.  Will watch your condition.  Will get help right away if you are not doing well or get worse. Document Released: 07/01/2007 Document Revised: 11/26/2011 Document Reviewed: 01/05/2014 [facility] Patient Information 2015 [place], [place]. This information is not intended to replace advice given to you by your health care provider. Make sure you discuss any questions  you have with your health care provider.  [employer] Patient Information 2015 [place]. This information is not intended to replace advice given to you by your health care provider. Make sure you discuss any questions you have with your health care provider.

## 2015-01-28 NOTE — ED Notes (Signed)
Call patient with lab results. Recommend she get a fasting blood sugar assessment for diabetes.  Gregor Hams, MD 01/28/15 [phone]

## 2015-05-31 ENCOUNTER — Emergency Department (HOSPITAL_COMMUNITY): Payer: Medicaid Other

## 2015-05-31 ENCOUNTER — Encounter (HOSPITAL_COMMUNITY): Payer: Self-pay | Admitting: Emergency Medicine

## 2015-05-31 ENCOUNTER — Emergency Department (HOSPITAL_COMMUNITY)
Admission: EM | Admit: 2015-05-31 | Discharge: 2015-05-31 | Disposition: A | Discharge status: Home / Self Care | Payer: Medicaid Other | Attending: Physician Assistant | Admitting: Physician Assistant

## 2015-05-31 DIAGNOSIS — Z79899 Other long term (current) drug therapy: Secondary | ICD-10-CM | POA: Insufficient documentation

## 2015-05-31 DIAGNOSIS — Z87828 Personal history of other (healed) physical injury and trauma: Secondary | ICD-10-CM | POA: Insufficient documentation

## 2015-05-31 DIAGNOSIS — J45909 Unspecified asthma, uncomplicated: Secondary | ICD-10-CM | POA: Insufficient documentation

## 2015-05-31 DIAGNOSIS — Z8739 Personal history of other diseases of the musculoskeletal system and connective tissue: Secondary | ICD-10-CM | POA: Insufficient documentation

## 2015-05-31 DIAGNOSIS — M545 Low back pain, unspecified: Secondary | ICD-10-CM

## 2015-05-31 DIAGNOSIS — G8929 Other chronic pain: Secondary | ICD-10-CM | POA: Insufficient documentation

## 2015-05-31 DIAGNOSIS — Z8742 Personal history of other diseases of the female genital tract: Secondary | ICD-10-CM | POA: Insufficient documentation

## 2015-05-31 LAB — URINALYSIS, ROUTINE W REFLEX MICROSCOPIC
Bilirubin Urine: NEGATIVE
Glucose, UA: NEGATIVE mg/dL
Hgb urine dipstick: NEGATIVE
Ketones, ur: NEGATIVE mg/dL
Leukocytes, UA: NEGATIVE
Nitrite: NEGATIVE
Protein, ur: NEGATIVE mg/dL
Specific Gravity, Urine: 1.027 (ref 1.005–1.030)
UROBILINOGEN UA: 1 mg/dL (ref 0.0–1.0)
pH: 6 (ref 5.0–8.0)

## 2015-05-31 MED ORDER — TRAMADOL HCL 50 MG PO TABS: 50.0000 mg | ORAL_TABLET | Freq: Four times a day (QID) | ORAL | Status: DC | PRN

## 2015-05-31 MED ORDER — CYCLOBENZAPRINE HCL 10 MG PO TABS: 10.0000 mg | ORAL_TABLET | Freq: Two times a day (BID) | ORAL | Status: DC | PRN

## 2015-05-31 MED ORDER — NAPROXEN 500 MG PO TABS
500.0000 mg | ORAL_TABLET | Freq: Two times a day (BID) | ORAL | Status: DC
Start: 2015-05-31 — End: 2015-07-26

## 2015-05-31 MED ORDER — OXYCODONE-ACETAMINOPHEN 5-325 MG PO TABS: 1.0000 | ORAL_TABLET | ORAL | Status: DC | PRN

## 2015-05-31 MED ORDER — KETOROLAC TROMETHAMINE 60 MG/2ML IM SOLN
60.0000 mg | Freq: Once | INTRAMUSCULAR | Status: AC
  Administered 2015-05-31: 60 mg via INTRAMUSCULAR
  Filled 2015-05-31: qty 2

## 2015-05-31 MED ORDER — DEXAMETHASONE SODIUM PHOSPHATE 10 MG/ML IJ SOLN
10.0000 mg | Freq: Once | INTRAMUSCULAR | Status: AC
  Administered 2015-05-31: 10 mg via INTRAMUSCULAR
  Filled 2015-05-31: qty 1

## 2015-05-31 MED ORDER — NAPROXEN 500 MG PO TABS: 500.0000 mg | ORAL_TABLET | Freq: Two times a day (BID) | ORAL | Status: DC

## 2015-05-31 NOTE — ED Notes (Signed)
Bed: WTR5 Expected date:  Expected time:  Means of arrival:  Comments: 

## 2015-05-31 NOTE — ED Provider Notes (Signed)
CSN: [id number]     Arrival date & time 05/31/15  0750 History   First MD Initiated Contact with Patient 05/31/15 0830     Chief Complaint  Patient presents with  . Back Pain     (Consider location/radiation/quality/duration/timing/severity/associated sxs/prior Treatment) HPI   [name] History 49-year-old female presents emergency Department with chief complaint of lower back pain. She has a past medical history of rheumatoid arthritis. The patient states that she began having achiness in her lower back. She states it is better with movement, worse after sitting still for long period of time. She states that she's been taking 800 mg ibuprofen with little relief. She tried heating and icy, [place] [facility], as well as getting in the hot tub. She denies any current disease modifying drugs for her RA or other immunosuppressants. She does endorse urinary frequency. She denies any other urinary symptoms. She denies fevers but states she has chronic flulike symptoms for more RA. She has had increased workload due to moving  Past Medical History  Diagnosis Date  . Asthma   . Rotator cuff tear   . Perimenopausal   . Slipped intervertebral disc   . Meniscus tear   . Chronic knee pain     right  . Rheumatoid arthritis    Past Surgical History  Procedure Laterality Date  . Cesarean section    . Oophorectomy    . Tubal ligation    . Shoulder surgery     Family History  Problem Relation Age of Onset  . Cancer Mother     colon cancer  . Cancer Father     brain cancer  . Alcohol abuse Father   . Diabetes Father    Social History  Substance Use Topics  . Smoking status: Never Smoker   . Smokeless tobacco: Never Used  . Alcohol Use: No   OB History    No data available     Review of Systems  Ten systems reviewed and are negative for acute change, except as noted in the HPI.    Allergies  Diclofenac sodium and Meloxicam  Home Medications   Prior to Admission  medications   Medication Sig Start Date End Date Taking? Authorizing Provider  albuterol (PROVENTIL HFA;VENTOLIN HFA) 108 (90 BASE) MCG/ACT inhaler Inhale 2 puffs into the lungs every 6 (six) hours as needed for wheezing or shortness of breath. For wheezing    Historical Provider, MD  cholecalciferol (VITAMIN D) 1000 UNITS tablet Take 1,000 Units by mouth daily with breakfast.    Historical Provider, MD  methocarbamol (ROBAXIN) 500 MG tablet Take 1 tablet (500 mg total) by mouth 2 (two) times daily. 11/15/14   Montine Circle, PA-C  traMADol (ULTRAM) 50 MG tablet Take 1 tablet (50 mg total) by mouth every 6 (six) hours as needed. 01/28/15   Gregor Hams, MD  zolpidem (AMBIEN) 10 MG tablet Take 5 mg by mouth at bedtime as needed for sleep.    Historical Provider, MD   BP 139/82 mmHg  Pulse 72  Temp(Src) 97.7 F (36.5 C) (Oral)  SpO2 97%  LMP 09/27/2011 Physical Exam  Constitutional: She is oriented to person, place, and time. She appears well-developed and well-nourished. No distress.  HENT:  Head: Normocephalic and atraumatic.  Eyes: Conjunctivae are normal. No scleral icterus.  Neck: Normal range of motion.  Cardiovascular: Normal rate, regular rhythm and normal heart sounds.  Exam reveals no gallop and no friction rub.   No  murmur heard. Pulmonary/Chest: Effort normal and breath sounds normal. No respiratory distress.  Abdominal: Soft. Bowel sounds are normal. She exhibits no distension and no mass. There is no tenderness. There is no guarding.  Musculoskeletal: She exhibits tenderness.  Patient with decreased range of motion of the lumbar spine. She is sitting in a forward leaning position. Tender to palpation across the bilateral lower back. No midline tenderness, bilateral lumbar paraspinal stiffness and spasm.  Neurological: She is alert and oriented to person, place, and time.  Skin: Skin is warm and dry. She is not diaphoretic.    ED Course  Procedures (including critical care  time) Labs Review Labs Reviewed  URINALYSIS, ROUTINE W REFLEX MICROSCOPIC (NOT AT [facility])    Imaging Review Dg Lumbar Spine Complete  05/31/2015   CLINICAL DATA:  Hurt low back while moving boxes at home 1 week ago, low back pain, initial encounter  EXAM: LUMBAR SPINE - COMPLETE 4+ VIEW  COMPARISON:  11/15/2014  FINDINGS: Five non-rib-bearing lumbar vertebra.  Osseous mineralization normal.  Minimal disc space narrowing and endplate spur formation L2-L3.  Vertebral body and disc space heights otherwise maintained.  No acute fracture, subluxation or bone destruction.  No spondylolysis.  SI joints symmetric.  IMPRESSION: Minimal degenerative disc disease changes L2-L3.  No acute abnormalities.   Electronically Signed   By: Lavonia Dana M.D.   On: 05/31/2015 10:09   I have personally reviewed and evaluated these images and lab results as part of my medical decision-making.   EKG Interpretation None      MDM   Final diagnoses:  Low back pain    Patient with x-ray that shows L2-L3 degenerative disc disease. Awaiting urinalysis. No red flag symptoms. She denies any bilateral lower extremity weakness, saddle anesthesia. No sciatic symptoms. Patient with back pain.  No neurological deficits and normal neuro exam.  Patient can walk but states is painful.  No loss of bowel or bladder control.  No concern for cauda equina.  No fever, night sweats, weight loss, h/o cancer, IVDU.  RICE protocol and pain medicine indicated and discussed with patient.      Margarita Mail, PA-C 06/04/15 0132  Courteney Julio Alm, MD 06/04/15 1450

## 2015-05-31 NOTE — ED Notes (Signed)
Patient transported to X-ray 

## 2015-05-31 NOTE — Discharge Instructions (Signed)
SEEK IMMEDIATE MEDICAL ATTENTION IF: °New numbness, tingling, weakness, or problem with the use of your arms or legs.  °Severe back pain not relieved with medications.  °Change in bowel or bladder control.  °Increasing pain in any areas of the body (such as chest or abdominal pain).  °Shortness of breath, dizziness or fainting.  °Nausea (feeling sick to your stomach), vomiting, fever, or sweats. ° °Back Pain, Adult °Low back pain is very common. About 1 in 5 people have back pain. The cause of low back pain is rarely dangerous. The pain often gets better over time. About half of people with a sudden onset of back pain feel better in just 2 weeks. About 8 in 10 people feel better by 6 weeks.  °CAUSES °Some common causes of back pain include: °· Strain of the muscles or ligaments supporting the spine. °· Wear and tear (degeneration) of the spinal discs. °· Arthritis. °· Direct injury to the back. °DIAGNOSIS °Most of the time, the direct cause of low back pain is not known. However, back pain can be treated effectively even when the exact cause of the pain is unknown. Answering your caregiver's questions about your overall health and symptoms is one of the most accurate ways to make sure the cause of your pain is not dangerous. If your caregiver needs more information, he or she may order lab work or imaging tests (X-rays or MRIs). However, even if imaging tests show changes in your back, this usually does not require surgery. °HOME CARE INSTRUCTIONS °For many people, back pain returns. Since low back pain is rarely dangerous, it is often a condition that people can learn to manage on their own.  °· Remain active. It is stressful on the back to sit or stand in one place. Do not sit, drive, or stand in one place for more than 30 minutes at a time. Take short walks on level surfaces as soon as pain allows. Try to increase the length of time you walk each day. °· Do not stay in bed. Resting more than 1 or 2 days can delay  your recovery. °· Do not avoid exercise or work. Your body is made to move. It is not dangerous to be active, even though your back may hurt. Your back will likely heal faster if you return to being active before your pain is gone. °· Pay attention to your body when you  bend and lift. Many people have less discomfort when lifting if they bend their knees, keep the load close to their bodies, and avoid twisting. Often, the most comfortable positions are those that put less stress on your recovering back. °· Find a comfortable position to sleep. Use a firm mattress and lie on your side with your knees slightly bent. If you lie on your back, put a pillow under your knees. °· Only take over-the-counter or prescription medicines as directed by your caregiver. Over-the-counter medicines to reduce pain and inflammation are often the most helpful. Your caregiver may prescribe muscle relaxant drugs. These medicines help dull your pain so you can more quickly return to your normal activities and healthy exercise. °· Put ice on the injured area. °¨ Put ice in a plastic bag. °¨ Place a towel between your skin and the bag. °¨ Leave the ice on for 15-20 minutes, 03-04 times a day for the first 2 to 3 days. After that, ice and heat may be alternated to reduce pain and spasms. °· Ask your caregiver about trying back exercises and gentle massage. This may be of some benefit. °· Avoid feeling anxious or stressed. Stress   increases muscle tension and can worsen back pain. It is important to recognize when you are anxious or stressed and learn ways to manage it. Exercise is a great option. °SEEK MEDICAL CARE IF: °· You have pain that is not relieved with rest or medicine. °· You have pain that does not improve in 1 week. °· You have new symptoms. °· You are generally not feeling well. °SEEK IMMEDIATE MEDICAL CARE IF:  °· You have pain that radiates from your back into your legs. °· You develop new bowel or bladder control  problems. °· You have unusual weakness or numbness in your arms or legs. °· You develop nausea or vomiting. °· You develop abdominal pain. °· You feel faint. °Document Released: 09/03/2005 Document Revised: 03/04/2012 Document Reviewed: 01/05/2014 °[employer]® Patient Information ©2015 [employer]. This information is not intended to replace advice given to you by your health care provider. Make sure you discuss any questions you have with your health care provider. ° °

## 2015-05-31 NOTE — ED Notes (Signed)
Awake. Verbally responsive. A/O x4. Resp even and unlabored. No audible adventitious breath sounds noted. ABC's intact.  

## 2015-05-31 NOTE — ED Notes (Signed)
Pt c/o lower back pain since Wed. Pt states that she has been moving and know she did it when carrying things.

## 2015-07-26 ENCOUNTER — Encounter (HOSPITAL_COMMUNITY): Payer: Self-pay | Admitting: Emergency Medicine

## 2015-07-26 ENCOUNTER — Emergency Department (HOSPITAL_COMMUNITY)
Admission: EM | Admit: 2015-07-26 | Discharge: 2015-07-26 | Disposition: A | Discharge status: Home / Self Care | Payer: Medicaid Other | Attending: Emergency Medicine | Admitting: Emergency Medicine

## 2015-07-26 DIAGNOSIS — Z780 Asymptomatic menopausal state: Secondary | ICD-10-CM | POA: Diagnosis not present

## 2015-07-26 DIAGNOSIS — G8929 Other chronic pain: Secondary | ICD-10-CM | POA: Insufficient documentation

## 2015-07-26 DIAGNOSIS — Z79899 Other long term (current) drug therapy: Secondary | ICD-10-CM | POA: Diagnosis not present

## 2015-07-26 DIAGNOSIS — M25511 Pain in right shoulder: Secondary | ICD-10-CM | POA: Insufficient documentation

## 2015-07-26 DIAGNOSIS — M069 Rheumatoid arthritis, unspecified: Secondary | ICD-10-CM | POA: Diagnosis not present

## 2015-07-26 DIAGNOSIS — M542 Cervicalgia: Secondary | ICD-10-CM | POA: Diagnosis not present

## 2015-07-26 DIAGNOSIS — J45909 Unspecified asthma, uncomplicated: Secondary | ICD-10-CM | POA: Diagnosis not present

## 2015-07-26 MED ORDER — TRAMADOL HCL 50 MG PO TABS: 50.0000 mg | ORAL_TABLET | Freq: Four times a day (QID) | ORAL | Status: DC | PRN

## 2015-07-26 MED ORDER — KETOROLAC TROMETHAMINE 60 MG/2ML IM SOLN
60.0000 mg | Freq: Once | INTRAMUSCULAR | Status: AC
  Administered 2015-07-26: 60 mg via INTRAMUSCULAR
  Filled 2015-07-26: qty 2

## 2015-07-26 MED ORDER — NAPROXEN 500 MG PO TABS: 500.0000 mg | ORAL_TABLET | Freq: Two times a day (BID) | ORAL | Status: DC

## 2015-07-26 MED ORDER — METHOCARBAMOL 500 MG PO TABS
500.0000 mg | ORAL_TABLET | Freq: Two times a day (BID) | ORAL | Status: DC
Start: 2015-07-26 — End: 2015-10-15

## 2015-07-26 NOTE — Discharge Instructions (Signed)
Cervical Strain and Sprain With Rehab Follow up with her primary care physician in 2 days. Take Robaxin and naproxen for pain and tramadol for breakthrough pain. Do not drive or operate machinery while using these medications. Cervical strain and sprain are injuries that commonly occur with "whiplash" injuries. Whiplash occurs when the neck is forcefully whipped backward or forward, such as during a motor vehicle accident or during contact sports. The muscles, ligaments, tendons, discs, and nerves of the neck are susceptible to injury when this occurs. RISK FACTORS Risk of having a whiplash injury increases if:  Osteoarthritis of the spine.  Situations that make head or neck accidents or trauma more likely.  High-risk sports (football, rugby, wrestling, hockey, auto racing, gymnastics, diving, contact karate, or boxing).  Poor strength and flexibility of the neck.  Previous neck injury.  Poor tackling technique.  Improperly fitted or padded equipment. SYMPTOMS   Pain or stiffness in the front or back of neck or both.  Symptoms may present immediately or up to 24 hours after injury.  Dizziness, headache, nausea, and vomiting.  Muscle spasm with soreness and stiffness in the neck.  Tenderness and swelling at the injury site. PREVENTION  Learn and use proper technique (avoid tackling with the head, spearing, and head-butting; use proper falling techniques to avoid landing on the head).  Warm up and stretch properly before activity.  Maintain physical fitness:  Strength, flexibility, and endurance.  Cardiovascular fitness.  Wear properly fitted and padded protective equipment, such as padded soft collars, for participation in contact sports. PROGNOSIS  Recovery from cervical strain and sprain injuries is dependent on the extent of the injury. These injuries are usually curable in 1 week to 3 months with appropriate treatment.  RELATED COMPLICATIONS   Temporary numbness and  weakness may occur if the nerve roots are damaged, and this may persist until the nerve has completely healed.  Chronic pain due to frequent recurrence of symptoms.  Prolonged healing, especially if activity is resumed too soon (before complete recovery). TREATMENT  Treatment initially involves the use of ice and medication to help reduce pain and inflammation. It is also important to perform strengthening and stretching exercises and modify activities that worsen symptoms so the injury does not get worse. These exercises may be performed at home or with a therapist. For patients who experience severe symptoms, a soft, padded collar may be recommended to be worn around the neck.  Improving your posture may help reduce symptoms. Posture improvement includes pulling your chin and abdomen in while sitting or standing. If you are sitting, sit in a firm chair with your buttocks against the back of the chair. While sleeping, try replacing your pillow with a small towel rolled to 2 inches in diameter, or use a cervical pillow or soft cervical collar. Poor sleeping positions delay healing.  For patients with nerve root damage, which causes numbness or weakness, the use of a cervical traction apparatus may be recommended. Surgery is rarely necessary for these injuries. However, cervical strain and sprains that are present at birth (congenital) may require surgery. MEDICATION   If pain medication is necessary, nonsteroidal anti-inflammatory medications, such as aspirin and ibuprofen, or other minor pain relievers, such as acetaminophen, are often recommended.  Do not take pain medication for 7 days before surgery.  Prescription pain relievers may be given if deemed necessary by your caregiver. Use only as directed and only as much as you need. HEAT AND COLD:   Cold treatment (icing) relieves  pain and reduces inflammation. Cold treatment should be applied for 10 to 15 minutes every 2 to 3 hours for  inflammation and pain and immediately after any activity that aggravates your symptoms. Use ice packs or an ice massage.  Heat treatment may be used prior to performing the stretching and strengthening activities prescribed by your caregiver, physical therapist, or athletic trainer. Use a heat pack or a warm soak. SEEK MEDICAL CARE IF:   Symptoms get worse or do not improve in 2 weeks despite treatment.  New, unexplained symptoms develop (drugs used in treatment may produce side effects). EXERCISES RANGE OF MOTION (ROM) AND STRETCHING EXERCISES - Cervical Strain and Sprain These exercises may help you when beginning to rehabilitate your injury. In order to successfully resolve your symptoms, you must improve your posture. These exercises are designed to help reduce the forward-head and rounded-shoulder posture which contributes to this condition. Your symptoms may resolve with or without further involvement from your physician, physical therapist or athletic trainer. While completing these exercises, remember:   Restoring tissue flexibility helps normal motion to return to the joints. This allows healthier, less painful movement and activity.  An effective stretch should be held for at least 20 seconds, although you may need to begin with shorter hold times for comfort.  A stretch should never be painful. You should only feel a gentle lengthening or release in the stretched tissue. STRETCH- Axial Extensors  Lie on your back on the floor. You may bend your knees for comfort. Place a rolled-up hand towel or dish towel, about 2 inches in diameter, under the part of your head that makes contact with the floor.  Gently tuck your chin, as if trying to make a "double chin," until you feel a gentle stretch at the base of your head.  Hold __________ seconds. Repeat __________ times. Complete this exercise __________ times per day.  STRETCH - Axial Extension   Stand or sit on a firm surface. Assume  a good posture: chest up, shoulders drawn back, abdominal muscles slightly tense, knees unlocked (if standing) and feet hip width apart.  Slowly retract your chin so your head slides back and your chin slightly lowers. Continue to look straight ahead.  You should feel a gentle stretch in the back of your head. Be certain not to feel an aggressive stretch since this can cause headaches later.  Hold for __________ seconds. Repeat __________ times. Complete this exercise __________ times per day. STRETCH - Cervical Side Bend   Stand or sit on a firm surface. Assume a good posture: chest up, shoulders drawn back, abdominal muscles slightly tense, knees unlocked (if standing) and feet hip width apart.  Without letting your nose or shoulders move, slowly tip your right / left ear to your shoulder until your feel a gentle stretch in the muscles on the opposite side of your neck.  Hold __________ seconds. Repeat __________ times. Complete this exercise __________ times per day. STRETCH - Cervical Rotators   Stand or sit on a firm surface. Assume a good posture: chest up, shoulders drawn back, abdominal muscles slightly tense, knees unlocked (if standing) and feet hip width apart.  Keeping your eyes level with the ground, slowly turn your head until you feel a gentle stretch along the back and opposite side of your neck.  Hold __________ seconds. Repeat __________ times. Complete this exercise __________ times per day. RANGE OF MOTION - Neck Circles   Stand or sit on a firm surface. Assume a  good posture: chest up, shoulders drawn back, abdominal muscles slightly tense, knees unlocked (if standing) and feet hip width apart.  Gently roll your head down and around from the back of one shoulder to the back of the other. The motion should never be forced or painful.  Repeat the motion 10-20 times, or until you feel the neck muscles relax and loosen. Repeat __________ times. Complete the exercise  __________ times per day. STRENGTHENING EXERCISES - Cervical Strain and Sprain These exercises may help you when beginning to rehabilitate your injury. They may resolve your symptoms with or without further involvement from your physician, physical therapist, or athletic trainer. While completing these exercises, remember:   Muscles can gain both the endurance and the strength needed for everyday activities through controlled exercises.  Complete these exercises as instructed by your physician, physical therapist, or athletic trainer. Progress the resistance and repetitions only as guided.  You may experience muscle soreness or fatigue, but the pain or discomfort you are trying to eliminate should never worsen during these exercises. If this pain does worsen, stop and make certain you are following the directions exactly. If the pain is still present after adjustments, discontinue the exercise until you can discuss the trouble with your clinician. STRENGTH - Cervical Flexors, Isometric  Face a wall, standing about 6 inches away. Place a small pillow, a ball about 6-8 inches in diameter, or a folded towel between your forehead and the wall.  Slightly tuck your chin and gently push your forehead into the soft object. Push only with mild to moderate intensity, building up tension gradually. Keep your jaw and forehead relaxed.  Hold 10 to 20 seconds. Keep your breathing relaxed.  Release the tension slowly. Relax your neck muscles completely before you start the next repetition. Repeat __________ times. Complete this exercise __________ times per day. STRENGTH- Cervical Lateral Flexors, Isometric   Stand about 6 inches away from a wall. Place a small pillow, a ball about 6-8 inches in diameter, or a folded towel between the side of your head and the wall.  Slightly tuck your chin and gently tilt your head into the soft object. Push only with mild to moderate intensity, building up tension  gradually. Keep your jaw and forehead relaxed.  Hold 10 to 20 seconds. Keep your breathing relaxed.  Release the tension slowly. Relax your neck muscles completely before you start the next repetition. Repeat __________ times. Complete this exercise __________ times per day. STRENGTH - Cervical Extensors, Isometric   Stand about 6 inches away from a wall. Place a small pillow, a ball about 6-8 inches in diameter, or a folded towel between the back of your head and the wall.  Slightly tuck your chin and gently tilt your head back into the soft object. Push only with mild to moderate intensity, building up tension gradually. Keep your jaw and forehead relaxed.  Hold 10 to 20 seconds. Keep your breathing relaxed.  Release the tension slowly. Relax your neck muscles completely before you start the next repetition. Repeat __________ times. Complete this exercise __________ times per day. POSTURE AND BODY MECHANICS CONSIDERATIONS - Cervical Strain and Sprain Keeping correct posture when sitting, standing or completing your activities will reduce the stress put on different body tissues, allowing injured tissues a chance to heal and limiting painful experiences. The following are general guidelines for improved posture. Your physician or physical therapist will provide you with any instructions specific to your needs. While reading these guidelines, remember:  The exercises prescribed by your provider will help you have the flexibility and strength to maintain correct postures.  The correct posture provides the optimal environment for your joints to work. All of your joints have less wear and tear when properly supported by a spine with good posture. This means you will experience a healthier, less painful body.  Correct posture must be practiced with all of your activities, especially prolonged sitting and standing. Correct posture is as important when doing repetitive low-stress activities (typing)  as it is when doing a single heavy-load activity (lifting). PROLONGED STANDING WHILE SLIGHTLY LEANING FORWARD When completing a task that requires you to lean forward while standing in one place for a long time, place either foot up on a stationary 2- to 4-inch high object to help maintain the best posture. When both feet are on the ground, the low back tends to lose its slight inward curve. If this curve flattens (or becomes too large), then the back and your other joints will experience too much stress, fatigue more quickly, and can cause pain.  RESTING POSITIONS Consider which positions are most painful for you when choosing a resting position. If you have pain with flexion-based activities (sitting, bending, stooping, squatting), choose a position that allows you to rest in a less flexed posture. You would want to avoid curling into a fetal position on your side. If your pain worsens with extension-based activities (prolonged standing, working overhead), avoid resting in an extended position such as sleeping on your stomach. Most people will find more comfort when they rest with their spine in a more neutral position, neither too rounded nor too arched. Lying on a non-sagging bed on your side with a pillow between your knees, or on your back with a pillow under your knees will often provide some relief. Keep in mind, being in any one position for a prolonged period of time, no matter how correct your posture, can still lead to stiffness. WALKING Walk with an upright posture. Your ears, shoulders, and hips should all line up. OFFICE WORK When working at a desk, create an environment that supports good, upright posture. Without extra support, muscles fatigue and lead to excessive strain on joints and other tissues. CHAIR:  A chair should be able to slide under your desk when your back makes contact with the back of the chair. This allows you to work closely.  The chair's height should allow your eyes  to be level with the upper part of your monitor and your hands to be slightly lower than your elbows.  Body position:  Your feet should make contact with the floor. If this is not possible, use a foot rest.  Keep your ears over your shoulders. This will reduce stress on your neck and low back.   This information is not intended to replace advice given to you by your health care provider. Make sure you discuss any questions you have with your health care provider.   Document Released: 09/03/2005 Document Revised: 09/24/2014 Document Reviewed: 12/16/2008 Elsevier Interactive Patient Education [employer].

## 2015-07-26 NOTE — ED Provider Notes (Signed)
CSN: [id number]     Arrival date & time 07/26/15  1107 History  This chart was scribed for non-physician practitioner, Ottie Glazier, PA-C, working with No att. providers found by Terressa Koyanagi, ED Scribe. This patient was seen in room TR06C/TR06C and the patient's care was started at 11:43 AM.  Chief Complaint  Patient presents with  . Neck Pain   The history is provided by the patient. No language interpreter was used.   PCP: Harle Battiest, MD HPI Comments: [name] is a 49 y.o. female, with PMHx noted below including RA, who presents to the Emergency Department complaining of atraumatic, sudden onset, 8/10, aching neck pain radiating to the right shoulder onset 5 days ago. Pt reports waking up with the pain 5 days ago. Pt reports taking Motrin at home every 4-6 hours with mild, temporary relief. Pt reports that sitting up alleviates the pain while lying down and movement makes the pain worse. While pt denies any trauma to the affected area, pt reports having an autistic son who often becomes physical and aggressive and pt has to restrain him. Pt denies any fever, headache.   Past Medical History  Diagnosis Date  . Asthma   . Rotator cuff tear   . Perimenopausal   . Slipped intervertebral disc   . Meniscus tear   . Chronic knee pain     right  . Rheumatoid arthritis [facility])    Past Surgical History  Procedure Laterality Date  . Cesarean section    . Oophorectomy    . Tubal ligation    . Shoulder surgery     Family History  Problem Relation Age of Onset  . Cancer Mother     colon cancer  . Cancer Father     brain cancer  . Alcohol abuse Father   . Diabetes Father    Social History  Substance Use Topics  . Smoking status: Never Smoker   . Smokeless tobacco: Never Used  . Alcohol Use: No   OB History    No data available     Review of Systems  Constitutional: Negative for fever.  Musculoskeletal: Positive for arthralgias (right shoulder pain) and neck pain.   Neurological: Negative for headaches.  Psychiatric/Behavioral: Negative for confusion.   Allergies  Diclofenac sodium and Meloxicam  Home Medications   Prior to Admission medications   Medication Sig Start Date End Date Taking? Authorizing Provider  albuterol (PROVENTIL HFA;VENTOLIN HFA) 108 (90 BASE) MCG/ACT inhaler Inhale 2 puffs into the lungs every 6 (six) hours as needed for wheezing or shortness of breath. For wheezing    Historical Provider, MD  cholecalciferol (VITAMIN D) 1000 UNITS tablet Take 1,000 Units by mouth daily with breakfast.    Historical Provider, MD  cyclobenzaprine (FLEXERIL) 10 MG tablet Take 1 tablet (10 mg total) by mouth 2 (two) times daily as needed for muscle spasms. 05/31/15   Margarita Mail, PA-C  methocarbamol (ROBAXIN) 500 MG tablet Take 1 tablet (500 mg total) by mouth 2 (two) times daily. 07/26/15   Dezaray Shibuya Patel-Mills, PA-C  naproxen (NAPROSYN) 500 MG tablet Take 1 tablet (500 mg total) by mouth 2 (two) times daily. 07/26/15   Nikeya Maxim Patel-Mills, PA-C  oxyCODONE-acetaminophen (PERCOCET) 5-325 MG per tablet Take 1-2 tablets by mouth every 4 (four) hours as needed for severe pain. 05/31/15   Margarita Mail, PA-C  traMADol (ULTRAM) 50 MG tablet Take 1 tablet (50 mg total) by mouth every 6 (six) hours as needed. 07/26/15   Ottie Glazier, PA-C  zolpidem (AMBIEN) 10 MG tablet Take 5 mg by mouth at bedtime as needed for sleep.    Historical Provider, MD   Triage Vitals: BP 144/70 mmHg  Pulse 92  Temp(Src) 97.4 F (36.3 C) (Oral)  Resp 18  Ht 5\' 10"  (1.778 m)  Wt 260 lb (117.935 kg)  BMI 37.31 kg/m2  SpO2 97%  LMP 09/27/2011 Physical Exam  Constitutional: She is oriented to person, place, and time. She appears well-developed and well-nourished.  HENT:  Head: Normocephalic.  Eyes: EOM are normal.  Neck: Normal range of motion.  Tenderness along left trapezius. Pt is able to flex and extend neck without difficulty, however, has difficulty rotating the neck  to left and right secondary to left shoulder pain. Pt has pain with passive abduction above 90 degrees. 2+ radial pulse. No rashes noted. No midline or paravertebral, cervical tenderness. 5/5 upper extremity strength; neurovascularly intact.   Pulmonary/Chest: Effort normal.  Abdominal: She exhibits no distension.  Musculoskeletal: Normal range of motion.  Neurological: She is alert and oriented to person, place, and time.  Psychiatric: She has a normal mood and affect.  Nursing note and vitals reviewed.  ED Course  Procedures (including critical care time) DIAGNOSTIC STUDIES: Oxygen Saturation is 97% on ra, nl by my interpretation.    COORDINATION OF CARE: 11:32 AM: Discussed treatment plan which includes, toradol shot,  Rx for muscle relaxer (robaxin) and antiinflammatories with pt at bedside; patient verbalizes understanding and agrees with treatment plan.  MDM   Final diagnoses:  Neck pain, musculoskeletal  Patient presents for left shoulder and neck pain that began 2-3 days ago. She has had this in the past when trying to deal with her son who has autism. She states he has sudden bilateral outbursts and when trying to control him, she sometimes get injured. She is well-appearing and has no red flags. She has no upper extremity weakness or numbness. I discussed return precautions with the patient as well as follow-up and she verbally agrees with the plan. Medications  ketorolac (TORADOL) injection 60 mg (60 mg Intramuscular Given 07/26/15 1223)  Rx: Robaxin, Ultram, and naproxen.  I explained that these could make her sleepy and to take them at night if she is watching her children.  I personally performed the services described in this documentation, which was scribed in my presence. The recorded information has been reviewed and is accurate.    Ottie Glazier, PA-C 07/26/15 1307  Leonard Schwartz, MD 07/27/15 [phone]

## 2015-07-26 NOTE — ED Notes (Signed)
Pt woke with neck pain 2-3 days ago. Has persisted and now goes to right shoulder. No known injury.

## 2015-09-28 ENCOUNTER — Encounter (HOSPITAL_COMMUNITY): Payer: Self-pay | Admitting: Emergency Medicine

## 2015-09-28 ENCOUNTER — Inpatient Hospital Stay (HOSPITAL_COMMUNITY)
Admission: EM | Admit: 2015-09-28 | Discharge: 2015-10-07 | DRG: 023 | Disposition: A | Discharge status: Rehabilitation Facility | Payer: Medicaid Other | Attending: Internal Medicine | Admitting: Internal Medicine

## 2015-09-28 ENCOUNTER — Emergency Department (HOSPITAL_COMMUNITY): Payer: Medicaid Other

## 2015-09-28 ENCOUNTER — Observation Stay (HOSPITAL_COMMUNITY): Payer: Medicaid Other

## 2015-09-28 DIAGNOSIS — Z888 Allergy status to other drugs, medicaments and biological substances status: Secondary | ICD-10-CM

## 2015-09-28 DIAGNOSIS — G44001 Cluster headache syndrome, unspecified, intractable: Secondary | ICD-10-CM

## 2015-09-28 DIAGNOSIS — B954 Other streptococcus as the cause of diseases classified elsewhere: Secondary | ICD-10-CM | POA: Diagnosis present

## 2015-09-28 DIAGNOSIS — M069 Rheumatoid arthritis, unspecified: Secondary | ICD-10-CM | POA: Diagnosis present

## 2015-09-28 DIAGNOSIS — Z79899 Other long term (current) drug therapy: Secondary | ICD-10-CM

## 2015-09-28 DIAGNOSIS — G060 Intracranial abscess and granuloma: Principal | ICD-10-CM | POA: Diagnosis present

## 2015-09-28 DIAGNOSIS — K083 Retained dental root: Secondary | ICD-10-CM | POA: Diagnosis present

## 2015-09-28 DIAGNOSIS — J45909 Unspecified asthma, uncomplicated: Secondary | ICD-10-CM | POA: Insufficient documentation

## 2015-09-28 DIAGNOSIS — R51 Headache: Secondary | ICD-10-CM

## 2015-09-28 DIAGNOSIS — D496 Neoplasm of unspecified behavior of brain: Secondary | ICD-10-CM

## 2015-09-28 DIAGNOSIS — K045 Chronic apical periodontitis: Secondary | ICD-10-CM | POA: Diagnosis present

## 2015-09-28 DIAGNOSIS — R930 Abnormal findings on diagnostic imaging of skull and head, not elsewhere classified: Secondary | ICD-10-CM | POA: Diagnosis not present

## 2015-09-28 DIAGNOSIS — F419 Anxiety disorder, unspecified: Secondary | ICD-10-CM | POA: Diagnosis present

## 2015-09-28 DIAGNOSIS — R519 Headache, unspecified: Secondary | ICD-10-CM | POA: Diagnosis present

## 2015-09-28 DIAGNOSIS — G459 Transient cerebral ischemic attack, unspecified: Secondary | ICD-10-CM | POA: Diagnosis not present

## 2015-09-28 DIAGNOSIS — K029 Dental caries, unspecified: Secondary | ICD-10-CM | POA: Diagnosis present

## 2015-09-28 DIAGNOSIS — K047 Periapical abscess without sinus: Secondary | ICD-10-CM

## 2015-09-28 DIAGNOSIS — G936 Cerebral edema: Secondary | ICD-10-CM | POA: Diagnosis present

## 2015-09-28 DIAGNOSIS — J4520 Mild intermittent asthma, uncomplicated: Secondary | ICD-10-CM | POA: Diagnosis present

## 2015-09-28 DIAGNOSIS — D62 Acute posthemorrhagic anemia: Secondary | ICD-10-CM | POA: Insufficient documentation

## 2015-09-28 DIAGNOSIS — G939 Disorder of brain, unspecified: Secondary | ICD-10-CM | POA: Diagnosis present

## 2015-09-28 DIAGNOSIS — W1830XA Fall on same level, unspecified, initial encounter: Secondary | ICD-10-CM | POA: Diagnosis present

## 2015-09-28 LAB — BASIC METABOLIC PANEL
Anion gap: 7 (ref 5–15)
BUN: 9 mg/dL (ref 6–20)
CALCIUM: 8.9 mg/dL (ref 8.9–10.3)
CO2: 27 mmol/L (ref 22–32)
CREATININE: 0.74 mg/dL (ref 0.44–1.00)
Chloride: 109 mmol/L (ref 101–111)
GFR calc Af Amer: >60 mL/min (ref >60)
GLUCOSE: 97 mg/dL (ref 65–99)
POTASSIUM: 3.7 mmol/L (ref 3.5–5.1)
SODIUM: 143 mmol/L (ref 135–145)

## 2015-09-28 LAB — CBC WITH DIFFERENTIAL/PLATELET
Basophils Absolute: 0 10*3/uL (ref 0.0–0.1)
Basophils Relative: 1 %
EOS ABS: 0.2 10*3/uL (ref 0.0–0.7)
EOS PCT: 5 %
HCT: 36.1 % (ref 36.0–46.0)
Hemoglobin: 12.2 g/dL (ref 12.0–15.0)
LYMPHS ABS: 1.9 10*3/uL (ref 0.7–4.0)
LYMPHS PCT: 44 %
MCH: 32.3 pg (ref 26.0–34.0)
MCHC: 33.8 g/dL (ref 30.0–36.0)
MCV: 95.5 fL (ref 78.0–100.0)
MONO ABS: 0.3 10*3/uL (ref 0.1–1.0)
Monocytes Relative: 6 %
Neutro Abs: 2 10*3/uL (ref 1.7–7.7)
Neutrophils Relative %: 44 %
PLATELETS: 276 10*3/uL (ref 150–400)
RBC: 3.78 MIL/uL — AB (ref 3.87–5.11)
RDW: 12.2 % (ref 11.5–15.5)
WBC: 4.4 10*3/uL (ref 4.0–10.5)

## 2015-09-28 MED ORDER — STROKE: EARLY STAGES OF RECOVERY BOOK
Freq: Once | Status: AC
  Administered 2015-09-30: 08:00:00
  Filled 2015-09-28: qty 1

## 2015-09-28 MED ORDER — METOCLOPRAMIDE HCL 5 MG/ML IJ SOLN
10.0000 mg | Freq: Once | INTRAMUSCULAR | Status: AC
  Administered 2015-09-28: 10 mg via INTRAVENOUS
  Filled 2015-09-28: qty 2

## 2015-09-28 MED ORDER — SODIUM CHLORIDE 0.9 % IV BOLUS (SEPSIS)
1000.0000 mL | Freq: Once | INTRAVENOUS | Status: AC
  Administered 2015-09-28: 1000 mL via INTRAVENOUS

## 2015-09-28 MED ORDER — DIPHENHYDRAMINE HCL 50 MG/ML IJ SOLN
12.5000 mg | Freq: Once | INTRAMUSCULAR | Status: AC
  Administered 2015-09-28: 12.5 mg via INTRAVENOUS
  Filled 2015-09-28: qty 1

## 2015-09-28 MED ORDER — ASPIRIN EC 81 MG PO TBEC
81.0000 mg | DELAYED_RELEASE_TABLET | Freq: Every day | ORAL | Status: DC
  Administered 2015-09-28 – 2015-10-07 (×10): 81 mg via ORAL
  Filled 2015-09-28 (×10): qty 1

## 2015-09-28 MED ORDER — LORAZEPAM 2 MG/ML IJ SOLN
1.0000 mg | Freq: Once | INTRAMUSCULAR | Status: AC
  Administered 2015-09-28: 1 mg via INTRAVENOUS
  Filled 2015-09-28: qty 1

## 2015-09-28 MED ORDER — ENOXAPARIN SODIUM 40 MG/0.4ML ~~LOC~~ SOLN
40.0000 mg | SUBCUTANEOUS | Status: DC
  Administered 2015-09-28 – 2015-09-29 (×2): 40 mg via SUBCUTANEOUS
  Filled 2015-09-28 (×3): qty 0.4

## 2015-09-28 MED ORDER — ALBUTEROL SULFATE (2.5 MG/3ML) 0.083% IN NEBU
3.0000 mL | INHALATION_SOLUTION | Freq: Four times a day (QID) | RESPIRATORY_TRACT | Status: DC | PRN
Start: 2015-09-28 — End: 2015-10-07
  Administered 2015-09-28 – 2015-10-06 (×8): 3 mL via RESPIRATORY_TRACT
  Filled 2015-09-28 (×8): qty 3

## 2015-09-28 MED ORDER — OXYCODONE-ACETAMINOPHEN 5-325 MG PO TABS
1.0000 | ORAL_TABLET | ORAL | Status: DC | PRN
Start: 2015-09-28 — End: 2015-10-07
  Administered 2015-09-29: 1 via ORAL
  Administered 2015-09-29: 2 via ORAL
  Administered 2015-09-29: 1 via ORAL
  Administered 2015-09-30 – 2015-10-07 (×29): 2 via ORAL
  Filled 2015-09-28 (×4): qty 2
  Filled 2015-09-28: qty 1
  Filled 2015-09-28 (×8): qty 2
  Filled 2015-09-28: qty 1
  Filled 2015-09-28 (×5): qty 2
  Filled 2015-09-28: qty 1
  Filled 2015-09-28 (×15): qty 2

## 2015-09-28 MED ORDER — GADOBENATE DIMEGLUMINE 529 MG/ML IV SOLN
20.0000 mL | Freq: Once | INTRAVENOUS | Status: AC | PRN
  Administered 2015-09-28: 20 mL via INTRAVENOUS

## 2015-09-28 NOTE — ED Notes (Signed)
MD at bedside. Osvaldo Human MD PRESENT SPEAKING WITH PT AND FAMILY

## 2015-09-28 NOTE — ED Provider Notes (Signed)
CSN: [id number]     Arrival date & time 09/28/15  1158 History   First MD Initiated Contact with Patient 09/28/15 1539     Chief Complaint  Patient presents with  . Headache    HPI   [name] is a 50 y.o. female with a PMH of asthma, RA who presents to the ED with headache x 4 days. She reports her headache has been constant, and characterizes her pain as throbbing. She denies exacerbating factors. She states she has taken motrin and aleve with no significant symptom relief. She denies history of headaches. She states today, she experienced numbness, weakness, and paresthesia to the left side of her body, which she states is now resolved. She notes this sensation was associated with palpitations. She also reports intermittent lightheadedness, dizziness, and blurry vision. She denies fever, chills, chest pain, shortness of breath, abdominal pain, N/V, speech difficulty, facial droop.   Past Medical History  Diagnosis Date  . Asthma   . Rotator cuff tear   . Perimenopausal   . Slipped intervertebral disc   . Meniscus tear   . Chronic knee pain     right  . Rheumatoid arthritis [facility])    Past Surgical History  Procedure Laterality Date  . Cesarean section    . Oophorectomy    . Tubal ligation    . Shoulder surgery     Family History  Problem Relation Age of Onset  . Cancer Mother     colon cancer  . Cancer Father     brain cancer  . Alcohol abuse Father   . Diabetes Father    Social History  Substance Use Topics  . Smoking status: Never Smoker   . Smokeless tobacco: Never Used  . Alcohol Use: No   OB History    No data available      Review of Systems  Constitutional: Negative for fever and chills.  Eyes: Positive for visual disturbance.  Respiratory: Negative for shortness of breath.   Cardiovascular: Positive for palpitations. Negative for chest pain.  Gastrointestinal: Negative for nausea, vomiting and abdominal pain.  Neurological: Positive for dizziness,  weakness, light-headedness, numbness and headaches. Negative for syncope, facial asymmetry and speech difficulty.  All other systems reviewed and are negative.     Allergies  Diclofenac sodium and Meloxicam  Home Medications   Prior to Admission medications   Medication Sig Start Date End Date Taking? Authorizing Provider  albuterol (PROVENTIL HFA;VENTOLIN HFA) 108 (90 BASE) MCG/ACT inhaler Inhale 2 puffs into the lungs every 6 (six) hours as needed for wheezing or shortness of breath. For wheezing   Yes Historical Provider, MD  cholecalciferol (VITAMIN D) 1000 UNITS tablet Take 1,000 Units by mouth daily with breakfast.   Yes Historical Provider, MD  cyclobenzaprine (FLEXERIL) 10 MG tablet Take 1 tablet (10 mg total) by mouth 2 (two) times daily as needed for muscle spasms. 05/31/15  Yes Margarita Mail, PA-C  erythromycin ophthalmic ointment Place 1 application into the right eye daily.   Yes Historical Provider, MD  methocarbamol (ROBAXIN) 500 MG tablet Take 1 tablet (500 mg total) by mouth 2 (two) times daily. 07/26/15  Yes Hanna Patel-Mills, PA-C  Multiple Vitamin (MULTIVITAMIN WITH MINERALS) TABS tablet Take 1 tablet by mouth daily.   Yes Historical Provider, MD  naproxen (NAPROSYN) 500 MG tablet Take 1 tablet (500 mg total) by mouth 2 (two) times daily. 07/26/15  Yes Hanna Patel-Mills, PA-C  naproxen sodium (ANAPROX) 220 MG tablet Take 440 mg  by mouth 2 (two) times daily as needed (pain).   Yes Historical Provider, MD  oxyCODONE-acetaminophen (PERCOCET) 5-325 MG per tablet Take 1-2 tablets by mouth every 4 (four) hours as needed for severe pain. 05/31/15  Yes Margarita Mail, PA-C  sodium chloride (MURO 128) 2 % ophthalmic solution Place 1 drop into the right eye 2 (two) times daily.   Yes Historical Provider, MD  sodium chloride (MURO 128) 5 % ophthalmic ointment Place 1 application into the right eye at bedtime.   Yes Historical Provider, MD  traMADol (ULTRAM) 50 MG tablet Take 1 tablet  (50 mg total) by mouth every 6 (six) hours as needed. 07/26/15  Yes Hanna Patel-Mills, PA-C    BP 135/75 mmHg  Pulse 66  Temp(Src) 97.9 F (36.6 C) (Oral)  Resp 18  SpO2 97%  LMP 09/27/2011 Physical Exam  Constitutional: She is oriented to person, place, and time. She appears well-developed and well-nourished. No distress.  HENT:  Head: Normocephalic and atraumatic.  Right Ear: External ear normal.  Left Ear: External ear normal.  Nose: Nose normal.  Mouth/Throat: Uvula is midline, oropharynx is clear and moist and mucous membranes are normal.  Eyes: Conjunctivae, EOM and lids are normal. Pupils are equal, round, and reactive to light. Right eye exhibits no discharge. Left eye exhibits no discharge. No scleral icterus.  Neck: Normal range of motion. Neck supple.  Cardiovascular: Normal rate, regular rhythm, normal heart sounds, intact distal pulses and normal pulses.   Pulmonary/Chest: Effort normal and breath sounds normal. No respiratory distress. She has no wheezes. She has no rales.  Abdominal: Soft. Normal appearance and bowel sounds are normal. She exhibits no distension and no mass. There is no tenderness. There is no rigidity, no rebound and no guarding.  Musculoskeletal: Normal range of motion. She exhibits no edema or tenderness.  Neurological: She is alert and oriented to person, place, and time. She has normal strength. No cranial nerve deficit or sensory deficit.  Skin: Skin is warm, dry and intact. No rash noted. She is not diaphoretic. No erythema. No pallor.  Psychiatric: She has a normal mood and affect. Her speech is normal and behavior is normal.  Nursing note and vitals reviewed.   ED Course  Procedures (including critical care time)  Labs Review Labs Reviewed  CBC WITH DIFFERENTIAL/PLATELET - Abnormal; Notable for the following:    RBC 3.78 (*)    All other components within normal limits  BASIC METABOLIC PANEL    Imaging Review Ct Head Wo  Contrast  09/28/2015  CLINICAL DATA:  Headache.  Left arm numbness. EXAM: CT HEAD WITHOUT CONTRAST TECHNIQUE: Contiguous axial images were obtained from the base of the skull through the vertex without intravenous contrast. COMPARISON:  None. FINDINGS: Skull and Sinuses:Negative for fracture or destructive process. Lobulated mucosal thickening in the maxillary sinuses, likely retention cysts. Large middle concha bullosa on the right with the septal deviation to the left. Visualized orbits: Negative. Brain: There is cortical and subcortical low-density in the high right parietal lobe with local sulcal effacement. Density overlapping the occipital horn of the right lateral ventricle has a linear non layering appearance and may be artifact/volume-averaging. No convincing hemorrhage. No hydrocephalus or shift. These results were called by telephone at the time of interpretation on 09/28/2015 at 4:42 pm to Dr. Bernerd Limbo , who verbally acknowledged these results. IMPRESSION: 1. Right parietal abnormality which could reflect small infarct or vasogenic edema (for mass or inflammation). Especially given reported deficit, recommend brain  MRI with contrast. 2. Indeterminate density at occipital horn right lateral ventricle, attention on follow-up. Electronically Signed   By: Monte Fantasia M.D.   On: 09/28/2015 16:44     I have personally reviewed and evaluated these images and lab results as part of my medical decision-making.   EKG Interpretation None      MDM   Final diagnoses:  Headache    50 year old female presents with headache x 4 days and transient L sided numbness/weakness/paresthesia today.  Patient is afebrile. Vital signs stable. Normal neuro exam with no focal deficit.  CBC, BMP unremarkable. Head CT remarkable for right parietal abnormality which could reflect small infarct or vasogenic edema, recommend brain MRI with contrast.  MRI with contrast ordered. Hospitalist consulted.  Spoke with Dr. Renne Crigler, who will see the patient in the ED. Advised discussing case with neurology. Neurology consulted. Spoke with Dr. Nicole Kindred. He advised CT finding may be artifact. Patient to be admitted for further evaluation and management.  BP 135/75 mmHg  Pulse 66  Temp(Src) 97.9 F (36.6 C) (Oral)  Resp 18  SpO2 97%  LMP 09/27/2011    Marella Chimes, PA-C 09/28/15 [id number]  Merrily Pew, MD 09/30/15 0104

## 2015-09-28 NOTE — ED Notes (Signed)
Bed: WA23 Expected date:  Expected time:  Means of arrival:  Comments: Hall c 

## 2015-09-28 NOTE — ED Notes (Signed)
Pt via EMS: pt having anxiety attack upon EMS arrival. Pt c/o numbness and tingling. Pt has also been c/o tension HA.

## 2015-09-28 NOTE — H&P (Signed)
History and Physical    [name] [id number] DOB: 1966/04/30 DOA: 09/28/2015  Referring physician: Bernerd Limbo, PA-C PCP: Harle Battiest, MD  Specialists: neurology   Chief Complaint: Headache with intermittent left arm weakness  HPI: [name] is a 50 y.o. female   without significant medical history, presents to the emergency room with a chief complaint of headache on and off for the past 4 days, as well as new onset left arm and left leg numbness and weakness today. She had a ground-level fall due to profound weakness on her left leg, denies any loss of consciousness, denies any injuries to her head. Her numbness has resolved, however she still feels a little bit weak on the left. She denies any fever or chills, denies any chest pain, she does endorse palpitations that are associated with her numbness. She denies any nausea or vomiting, denies any photophobia. Her headache is mostly in bilateral temples, is mild in intensity however persistent. She denies any shortness of breath, she denies any lightheadedness or dizziness. In the emergency room, she had a CT scan of the head without contrast which showed a "right parietal abnormality which could reflect small infarct or vasogenic edema". An MRI has been ordered, and [facility] was asked for admission for TIA. Blood work in the ED with fairly unremarkable, she has normal vital signs as well.   Review of Systems:  as per history of present illness, otherwise 10 point review of systems negative   Past Medical History  Diagnosis Date  . Asthma   . Rotator cuff tear   . Perimenopausal   . Slipped intervertebral disc   . Meniscus tear   . Chronic knee pain     right  . Rheumatoid arthritis [facility])    Past Surgical History  Procedure Laterality Date  . Cesarean section    . Oophorectomy    . Tubal ligation    . Shoulder surgery     Social History:  reports that she has never smoked. She has never used smokeless tobacco. She reports  that she does not drink alcohol or use illicit drugs.  Allergies  Allergen Reactions  . Diclofenac Sodium Itching  . Meloxicam Itching    Family History  Problem Relation Age of Onset  . Cancer Mother     colon cancer  . Cancer Father     brain cancer  . Alcohol abuse Father   . Diabetes Father     Prior to Admission medications   Medication Sig Start Date End Date Taking? Authorizing Provider  albuterol (PROVENTIL HFA;VENTOLIN HFA) 108 (90 BASE) MCG/ACT inhaler Inhale 2 puffs into the lungs every 6 (six) hours as needed for wheezing or shortness of breath. For wheezing   Yes Historical Provider, MD  cholecalciferol (VITAMIN D) 1000 UNITS tablet Take 1,000 Units by mouth daily with breakfast.   Yes Historical Provider, MD  cyclobenzaprine (FLEXERIL) 10 MG tablet Take 1 tablet (10 mg total) by mouth 2 (two) times daily as needed for muscle spasms. 05/31/15  Yes Margarita Mail, PA-C  erythromycin ophthalmic ointment Place 1 application into the right eye daily.   Yes Historical Provider, MD  methocarbamol (ROBAXIN) 500 MG tablet Take 1 tablet (500 mg total) by mouth 2 (two) times daily. 07/26/15  Yes Hanna Patel-Mills, PA-C  Multiple Vitamin (MULTIVITAMIN WITH MINERALS) TABS tablet Take 1 tablet by mouth daily.   Yes Historical Provider, MD  naproxen (NAPROSYN) 500 MG tablet Take 1 tablet (500 mg total) by mouth 2 (  two) times daily. 07/26/15  Yes Hanna Patel-Mills, PA-C  naproxen sodium (ANAPROX) 220 MG tablet Take 440 mg by mouth 2 (two) times daily as needed (pain).   Yes Historical Provider, MD  oxyCODONE-acetaminophen (PERCOCET) 5-325 MG per tablet Take 1-2 tablets by mouth every 4 (four) hours as needed for severe pain. 05/31/15  Yes Margarita Mail, PA-C  sodium chloride (MURO 128) 2 % ophthalmic solution Place 1 drop into the right eye 2 (two) times daily.   Yes Historical Provider, MD  sodium chloride (MURO 128) 5 % ophthalmic ointment Place 1 application into the right eye at bedtime.    Yes Historical Provider, MD  traMADol (ULTRAM) 50 MG tablet Take 1 tablet (50 mg total) by mouth every 6 (six) hours as needed. 07/26/15  Yes Ottie Glazier, PA-C   Physical Exam: Filed Vitals:   09/28/15 1206 09/28/15 1626  BP: 153/69 135/75  Pulse: 69 66  Temp: 97.9 F (36.6 C)   TempSrc: Oral   Resp: 16 18  SpO2: 100% 97%     GENERAL: NAD  HEENT: head NCAT, no scleral icterus. Pupils round and reactive.  NECK: Supple.   LUNGS: Clear to auscultation. No wheezing or crackles  HEART: Regular rate and rhythm without murmur. 2+ pulses, no JVD, no peripheral edema  ABDOMEN: Soft, nontender, and nondistended. Positive bowel sounds.   EXTREMITIES: Without any cyanosis, clubbing, rash, lesions or edema.  NEUROLOGIC: Alert and oriented x3. Cranial nerves II through XII are grossly intact. Strength 5/5 in all 4.    Labs on Admission:  Basic Metabolic Panel:  Recent Labs Lab 09/28/15 1612  NA 143  K 3.7  CL 109  CO2 27  GLUCOSE 97  BUN 9  CREATININE 0.74  CALCIUM 8.9   CBC:  Recent Labs Lab 09/28/15 1612  WBC 4.4  NEUTROABS 2.0  HGB 12.2  HCT 36.1  MCV 95.5  PLT 276   Radiological Exams on Admission: Ct Head Wo Contrast  09/28/2015  CLINICAL DATA:  Headache.  Left arm numbness. EXAM: CT HEAD WITHOUT CONTRAST TECHNIQUE: Contiguous axial images were obtained from the base of the skull through the vertex without intravenous contrast. COMPARISON:  None. FINDINGS: Skull and Sinuses:Negative for fracture or destructive process. Lobulated mucosal thickening in the maxillary sinuses, likely retention cysts. Large middle concha bullosa on the right with the septal deviation to the left. Visualized orbits: Negative. Brain: There is cortical and subcortical low-density in the high right parietal lobe with local sulcal effacement. Density overlapping the occipital horn of the right lateral ventricle has a linear non layering appearance and may be  artifact/volume-averaging. No convincing hemorrhage. No hydrocephalus or shift. These results were called by telephone at the time of interpretation on 09/28/2015 at 4:42 pm to Dr. [name] , who verbally acknowledged these results. IMPRESSION: 1. Right parietal abnormality which could reflect small infarct or vasogenic edema (for mass or inflammation). Especially given reported deficit, recommend brain MRI with contrast. 2. Indeterminate density at occipital horn right lateral ventricle, attention on follow-up. Electronically Signed   By: Monte Fantasia M.D.   On: 09/28/2015 16:44   EKG: Independently reviewed. Sinus rhythm    Assessment/Plan Active Problems:   TIA (transient ischemic attack)   Headache  TIA  - We'll admit patient under observation, on telemetry, MRI of the brain is pending, complete workup with carotid ultrasound, 2-D echo, obtain lipid panel, obtain hemoglobin A1c   Headaches - Symptomatically treatment, obtain MRI of the brain   Diet:  regular Fluids: none DVT Prophylaxis: Lovenox  Code Status: Full  Family Communication: d/w mother bedside  Disposition Plan: observation, telemetry    Karmela Bram M. Cruzita Lederer, MD [facility] Pager [phone]  If 7PM-7AM, please contact night-coverage [email or web address] Password [facility] 09/28/2015, 5:17 PM

## 2015-09-28 NOTE — ED Notes (Signed)
ED PA at bedside

## 2015-09-28 NOTE — ED Notes (Signed)
Patient transported to MRI 

## 2015-09-28 NOTE — Consult Note (Signed)
Admission H&P    Chief Complaint: acute onset of left-sided numbness.  HPI: [name] is an 50 y.o. female with no known medical history other than asthma, presenting with acute onset of left-sided weakness involving her left upper extremity primarily, and to lesser extent left lower extremity. Onset was at 9 AM today. She has no previous history of stroke nor TIA. She has not been on an time weekly therapy. CT scan of her head showed an equivocal abnormal hypodensity involving the right parietal region (versus artifact). Further evaluation with MRI is pending. Patient also complained of a headache which has been present for the past 4 days. NIH stroke score was 1 for slight drift of left upper extremity. Numbness has resolved.  LSN: 9 AM on 09/29/2015 tPA Given: No: rapidly resolving deficits mRankin:  Past Medical History  Diagnosis Date  . Asthma   . Rotator cuff tear   . Perimenopausal   . Slipped intervertebral disc   . Meniscus tear   . Chronic knee pain     right  . Rheumatoid arthritis [facility])     Past Surgical History  Procedure Laterality Date  . Cesarean section    . Oophorectomy    . Tubal ligation    . Shoulder surgery      Family History  Problem Relation Age of Onset  . Cancer Mother     colon cancer  . Cancer Father     brain cancer  . Alcohol abuse Father   . Diabetes Father    Social History:  reports that she has never smoked. She has never used smokeless tobacco. She reports that she does not drink alcohol or use illicit drugs.  Allergies:  Allergies  Allergen Reactions  . Diclofenac Sodium Itching  . Meloxicam Itching   Medications: Patient's preadmission medications were reviewed by me.  ROS: History obtained from the patient  General ROS: negative for - chills, fatigue, fever, night sweats, weight gain or weight loss Psychological ROS: negative for - behavioral disorder, hallucinations, memory difficulties, mood swings or suicidal  ideation Ophthalmic ROS: negative for - blurry vision, double vision, eye pain or loss of vision ENT ROS: negative for - epistaxis, nasal discharge, oral lesions, sore throat, tinnitus or vertigo Allergy and Immunology ROS: negative for - hives or itchy/watery eyes Hematological and Lymphatic ROS: negative for - bleeding problems, bruising or swollen lymph nodes Endocrine ROS: negative for - galactorrhea, hair pattern changes, polydipsia/polyuria or temperature intolerance Respiratory ROS: negative for - cough, hemoptysis, shortness of breath or wheezing Cardiovascular ROS: negative for - chest pain, dyspnea on exertion, edema or irregular heartbeat Gastrointestinal ROS: negative for - abdominal pain, diarrhea, hematemesis, nausea/vomiting or stool incontinence Genito-Urinary ROS: negative for - dysuria, hematuria, incontinence or urinary frequency/urgency Musculoskeletal ROS: negative for - joint swelling or muscular weakness Neurological ROS: as noted in HPI Dermatological ROS: negative for rash and skin lesion changes  Physical Examination: Blood pressure 135/75, pulse 66, temperature 97.9 F (36.6 C), temperature source Oral, resp. rate 18, last menstrual period 09/27/2011, SpO2 97 %.  HEENT-  Normocephalic, no lesions, without obvious abnormality.  Normal external eye and conjunctiva.  Normal TM's bilaterally.  Normal auditory canals and external ears. Normal external nose, mucus membranes and septum.  Normal pharynx. Neck supple with no masses, nodes, nodules or enlargement. Cardiovascular - regular rate and rhythm, S1, S2 normal, no murmur, click, rub or gallop Lungs - chest clear, no wheezing, rales, normal symmetric air entry Abdomen - soft, non-tender;  bowel sounds normal; no masses,  no organomegaly Extremities - no joint deformities, effusion, or inflammation and no edema  Neurologic Examination: Mental Status: Alert, oriented, thought content appropriate.  Speech fluent  without evidence of aphasia. Able to follow commands without difficulty. Cranial Nerves: II-Visual fields were normal. III/IV/VI-Pupils were equal and reacted normally to light. Extraocular movements were full and conjugate.    V/VII-no facial numbness and no facial weakness. VIII-normal. X-normal speech and symmetrical palatal movement. XI: trapezius strength/neck flexion strength normal bilaterally XII-midline tongue extension with normal strength. Motor: mild drift of left upper extremity; otherwise normal motor exam. Sensory: Normal throughout. Deep Tendon Reflexes: 1+ and symmetric. Plantars: mute bilaterally Cerebellar: Normal finger-to-nose testing. Carotid auscultation: normal    Results for orders placed or performed during the hospital encounter of 09/28/15 (from the past 48 hour(s))  CBC with Differential     Status: Abnormal   Collection Time: 09/28/15  4:12 PM  Result Value Ref Range   WBC 4.4 4.0 - 10.5 K/uL   RBC 3.78 (L) 3.87 - 5.11 MIL/uL   Hemoglobin 12.2 12.0 - 15.0 g/dL   HCT 36.1 36.0 - 46.0 %   MCV 95.5 78.0 - 100.0 fL   MCH 32.3 26.0 - 34.0 pg   MCHC 33.8 30.0 - 36.0 g/dL   RDW 12.2 11.5 - 15.5 %   Platelets 276 150 - 400 K/uL   Neutrophils Relative % 44 %   Neutro Abs 2.0 1.7 - 7.7 K/uL   Lymphocytes Relative 44 %   Lymphs Abs 1.9 0.7 - 4.0 K/uL   Monocytes Relative 6 %   Monocytes Absolute 0.3 0.1 - 1.0 K/uL   Eosinophils Relative 5 %   Eosinophils Absolute 0.2 0.0 - 0.7 K/uL   Basophils Relative 1 %   Basophils Absolute 0.0 0.0 - 0.1 K/uL  Basic metabolic panel     Status: None   Collection Time: 09/28/15  4:12 PM  Result Value Ref Range   Sodium 143 135 - 145 mmol/L   Potassium 3.7 3.5 - 5.1 mmol/L   Chloride 109 101 - 111 mmol/L   CO2 27 22 - 32 mmol/L   Glucose, Bld 97 65 - 99 mg/dL   BUN 9 6 - 20 mg/dL   Creatinine, Ser 0.74 0.44 - 1.00 mg/dL   Calcium 8.9 8.9 - 10.3 mg/dL   GFR calc non Af Amer >60 >60 mL/min   GFR calc Af Amer >60  >60 mL/min    Comment: (NOTE) The eGFR has been calculated using the CKD EPI equation. This calculation has not been validated in all clinical situations. eGFR's persistently <60 mL/min signify possible Chronic Kidney Disease.    Anion gap 7 5 - 15   Ct Head Wo Contrast  09/28/2015  CLINICAL DATA:  Headache.  Left arm numbness. EXAM: CT HEAD WITHOUT CONTRAST TECHNIQUE: Contiguous axial images were obtained from the base of the skull through the vertex without intravenous contrast. COMPARISON:  None. FINDINGS: Skull and Sinuses:Negative for fracture or destructive process. Lobulated mucosal thickening in the maxillary sinuses, likely retention cysts. Large middle concha bullosa on the right with the septal deviation to the left. Visualized orbits: Negative. Brain: There is cortical and subcortical low-density in the high right parietal lobe with local sulcal effacement. Density overlapping the occipital horn of the right lateral ventricle has a linear non layering appearance and may be artifact/volume-averaging. No convincing hemorrhage. No hydrocephalus or shift. These results were called by telephone at the time of  interpretation on 09/28/2015 at 4:42 pm to Dr. Bernerd Limbo , who verbally acknowledged these results. IMPRESSION: 1. Right parietal abnormality which could reflect small infarct or vasogenic edema (for mass or inflammation). Especially given reported deficit, recommend brain MRI with contrast. 2. Indeterminate density at occipital horn right lateral ventricle, attention on follow-up. Electronically Signed   By: Monte Fantasia M.D.   On: 09/28/2015 16:44    Assessment: 50 y.o. female presenting with possible right subcortical TIA. Small vessel subcortical acute infarction cannot be ruled out at this point, however.  Stroke Risk Factors - none  Plan: 1. HgbA1c, fasting lipid panel 2. MRI, MRA  of the brain without contrast 3. PT consult, OT consult, Speech consult 4.  Echocardiogram 5. Carotid dopplers 6. Prophylactic therapy-Antiplatelet med: Aspirin  7. Risk factor modification 8. Telemetry monitoring 9. Hypercoagulopathy panel  C.R. Nicole Kindred, MD [facility] [phone]  09/28/2015, 6:38 PM

## 2015-09-29 ENCOUNTER — Observation Stay (HOSPITAL_COMMUNITY)
Admit: 2015-09-29 | Discharge: 2015-09-29 | Disposition: A | Discharge status: Home / Self Care | Payer: Medicaid Other | Attending: Neurology | Admitting: Neurology

## 2015-09-29 ENCOUNTER — Observation Stay (HOSPITAL_BASED_OUTPATIENT_CLINIC_OR_DEPARTMENT_OTHER): Payer: Medicaid Other

## 2015-09-29 DIAGNOSIS — R930 Abnormal findings on diagnostic imaging of skull and head, not elsewhere classified: Secondary | ICD-10-CM | POA: Diagnosis not present

## 2015-09-29 DIAGNOSIS — G939 Disorder of brain, unspecified: Secondary | ICD-10-CM | POA: Diagnosis present

## 2015-09-29 DIAGNOSIS — R51 Headache: Secondary | ICD-10-CM | POA: Diagnosis not present

## 2015-09-29 DIAGNOSIS — M6281 Muscle weakness (generalized): Secondary | ICD-10-CM

## 2015-09-29 DIAGNOSIS — G459 Transient cerebral ischemic attack, unspecified: Secondary | ICD-10-CM

## 2015-09-29 LAB — LIPID PANEL
CHOLESTEROL: 146 mg/dL (ref 0–200)
HDL: 35 mg/dL — ABNORMAL LOW (ref >40)
LDL Cholesterol: 88 mg/dL (ref 0–99)
TRIGLYCERIDES: 117 mg/dL (ref <150)
Total CHOL/HDL Ratio: 4.2 RATIO
VLDL: 23 mg/dL (ref 0–40)

## 2015-09-29 MED ORDER — TUBERCULIN PPD 5 UNIT/0.1ML ID SOLN
5.0000 [IU] | Freq: Once | INTRADERMAL | Status: AC
  Administered 2015-09-29: 5 [IU] via INTRADERMAL
  Filled 2015-09-29: qty 0.1

## 2015-09-29 NOTE — Progress Notes (Signed)
Tb skin test administered to patient in R arm. Test to be read on Saturday 10/01/15 at [place].

## 2015-09-29 NOTE — Progress Notes (Signed)
VASCULAR LAB PRELIMINARY  PRELIMINARY  PRELIMINARY  PRELIMINARY  Carotid duplex completed.    Preliminary report:  Bilateral - No evidence of ICA stenosis. Vertebral artery flow is antegrade.  Jamal Pavon, RVS 09/29/2015, 9:06 AM

## 2015-09-29 NOTE — Progress Notes (Signed)
PROGRESS NOTE  [name] [id number] DOB: 02/02/1966 DOA: 09/28/2015 PCP: Harle Battiest, MD  HPI: [name] is a 50 y.o. femalewithout significant medical history, who presented to the emergency room with a chief complaint of headache on and off x4 days, and new onset left arm and left leg numbness and weakness.  Subjective / 24 H Interval events Her numbness and weakness has improved significantly, she is able to get up and go to the bathroom on her own. Her headache resolved after Percocet administration. She denies chest pain or palpitations but reported very mild conversational dyspnea.  Assessment/Plan:  Newly noted 1cm ring enhancing lesion on MRI - Headache resolved with Percocet, weakness and numbness much improved today - MRI brain shows 1cm right parietal cortical/subcortical lesion, well circumscribed with thin internal cystic portion and ring enhancement. 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 No HIV risk factors identified, no recent travel, no regular contact with cats/cat feces. - Await EEG - Continue ASA for now, percocet PRN for headache - No plans for Decadron at this time, no significant mass effect on MRI and symptoms improved. - Infectious Disease consulted, discussed with Dr. Baxter Flattery  Palpitations, suspect related to anxiety, now resolved - EKG reviewed, NSR - Echo with EF 65-70%, grade 1 DD  Intermittent Asthma - well controlled, no wheezing  Diet: Regular  Fluids: None DVT Prophylaxis: Prophylactic lovenox  Code Status: Full Code Family Communication: None at bedside. The patient's sister is a Podiatrist who will be visiting later today. Disposition Plan: Likely discharge to home Barriers to discharge: Awaiting infectious workup, Neurosurgery and Infectious Disease  recommendations.  Consultants:  Neurology  Infectious Disease  Neurosurgery  Procedures:  2D echo Study Conclusions - Left ventricle: The cavity size was normal. There was moderateconcentric hypertrophy. Systolic function was vigorous. The estimated ejection fraction was in the range of 65% to 70%. Wallmotion was normal; there were no regional wall motionabnormalities. Doppler parameters are consistent with abnormalleft ventricular relaxation (grade 1 diastolic dysfunction).There was no evidence of elevated ventricular filling pressure byDoppler parameters. - Aortic valve: There was no regurgitation. - Aortic root: The aortic root was normal in size. - Left atrium: The atrium was normal in size. - Right ventricle: Systolic function was normal. - Tricuspid valve: There was trivial regurgitation. - Pulmonic valve: There was no regurgitation. - Pulmonary arteries: Systolic pressure was within the normalrange. - Inferior vena cava: The vessel was normal in size. - Pericardium, extracardiac: There was no pericardial effusion.   Antibiotics None   Studies  Ct Head Wo Contrast  09/28/2015  CLINICAL DATA:  Headache.  Left arm numbness. EXAM: CT HEAD WITHOUT CONTRAST TECHNIQUE: Contiguous axial images were obtained from the base of the skull through the vertex without intravenous contrast. COMPARISON:  None. FINDINGS: Skull and Sinuses:Negative for fracture or destructive process. Lobulated mucosal thickening in the maxillary sinuses, likely retention cysts. Large middle concha bullosa on the right with the septal deviation to the left. Visualized orbits: Negative. Brain: There is cortical and subcortical low-density in the high right parietal lobe with local sulcal effacement. Density overlapping the occipital horn of the right lateral ventricle has a linear non layering appearance and may be artifact/volume-averaging. No convincing hemorrhage. No hydrocephalus or shift. These  results were called by telephone at the time of interpretation on 09/28/2015 at 4:42 pm to Dr. Bernerd Limbo , who verbally acknowledged  these results. IMPRESSION: 1. Right parietal abnormality which could reflect small infarct or vasogenic edema (for mass or inflammation). Especially given reported deficit, recommend brain MRI with contrast. 2. Indeterminate density at occipital horn right lateral ventricle, attention on follow-up. Electronically Signed   By: Monte Fantasia M.D.   On: 09/28/2015 16:44   Mr Jeri Cos [id number] Contrast  09/28/2015  CLINICAL DATA:  Acute presentation with headache and left arm weakness. Abnormal head CT EXAM: MRI HEAD WITHOUT AND WITH CONTRAST TECHNIQUE: Multiplanar, multiecho pulse sequences of the brain and surrounding structures were obtained without and with intravenous contrast. CONTRAST:  25mL MULTIHANCE GADOBENATE DIMEGLUMINE 529 MG/ML IV SOLN COMPARISON:  Head CT same day FINDINGS: There is a 1 cm lesion in the right parietal cortical/subcortical brain with surrounding edema. This appears to be a round well-circumscribed lesion with thin internal cystic portion. The abnormality shows restricted diffusion. The abnormality shows ring enhancement. 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 This does not represent an infarction. Pyogenic abscess is possible is well. The remainder of the study is normal except for a few insignificant punctate white matter foci in the frontal lobes. No hydrocephalus. No extra-axial collection. No sign of brain hemorrhage. IMPRESSION: 1 cm ring-enhancing lesion in the right parietal cortical and subcortical brain with surrounding vasogenic edema. 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 Necrotic brain metastasis is also possible but least likely. Electronically Signed   By: Nelson Chimes M.D.   On: 09/28/2015 20:19    Objective  Filed Vitals:   09/29/15 0125  09/29/15 0300 09/29/15 0514 09/29/15 0751  BP: 131/77 136/73 128/81 109/64  Pulse: 76 68 64 74  Temp: 98.1 F (36.7 C) 98 F (36.7 C) 98 F (36.7 C) 97.9 F (36.6 C)  TempSrc: Oral Oral Oral Oral  Resp: 18 20 18 18   Height:      Weight:      SpO2: 97% 98% 95% 97%    Intake/Output Summary (Last 24 hours) at 09/29/15 1012 Last data filed at 09/28/15 2200  Gross per 24 hour  Intake    360 ml  Output      0 ml  Net    360 ml   Filed Weights   09/28/15 1902  Weight: 113.399 kg (250 lb)    Exam:  GENERAL: NAD, clear speech  HEENT: no scleral icterus, PERRL, EOMI  NECK: supple  LUNGS: CTA biL, no wheezing appreciated  HEART: RRR without MRG  ABDOMEN: soft, non tender, nondistended, no gaurding  EXTREMITIES: no clubbing / cyanosis, no edema  NEUROLOGIC: CN II-XII grossly intact, LUE/LLE sensation intact, no pronator drift  PSYCHIATRIC: normal mood and affect, cooperative  SKIN: no rashes, no diaphoresis  Data Reviewed: Basic Metabolic Panel:  Recent Labs Lab 09/28/15 1612  NA 143  K 3.7  CL 109  CO2 27  GLUCOSE 97  BUN 9  CREATININE 0.74  CALCIUM 8.9   CBC:  Recent Labs Lab 09/28/15 1612  WBC 4.4  NEUTROABS 2.0  HGB 12.2  HCT 36.1  MCV 95.5  PLT 276    Scheduled Meds: .  stroke: mapping our early stages of recovery book   Does not apply Once  . aspirin EC  81 mg Oral Daily  . enoxaparin (LOVENOX) injection  40 mg Subcutaneous Q24H    Marzetta Board, MD [facility] Pager [phone]. If 7 PM - 7 AM, please contact night-coverage at [email or web address], password [facility] 09/29/2015, 10:12 AM

## 2015-09-29 NOTE — Progress Notes (Signed)
EEG completed; results pending.    

## 2015-09-29 NOTE — Consult Note (Signed)
[facility] for Infectious Disease  Total days of antibiotics 0               Reason for Consult: parietal parenchyma lesions concerning for CNS infection    Referring Physician: gherghe  Active Problems:   TIA (transient ischemic attack)   Headache   Brain lesion    HPI: Sanaii Caporaso is a 50 y.o. female who was in good state of health until 5 days prior to admission when she started to have new onset headache that was retro-orbital bilaterally then localized to right side behind her ear. She tried tylenol, alleve, tramadol without any relief. On the early morning of day of admit, she reports having left arm weakness, and decrease sensation to left side. She had episode of overall weakness where she fell to the ground but denies loss of consciousness. Her 9yo sons called EMS for her. She was admitted for possible TIA/stroke. Her CT/MRI of head imaging reveals a 1 cm lesion in the right parietal cortical/subcortical brain with surrounding edema. This appears to be a round well-circumscribed lesion with thin internal cystic portion. ID consulted for evaluation of brain lesion in addition to neurology  The patient denies any travel outside of the [place] (other than [place] roughly 4-26yr ago). No exposure to TB. She did get tested for TB [place] of [place] 10 years ago. No recent volunteering for jail or homeless.   She denies any fever, chills, nightsweats, though she has felt warm which she attributes to hot flases.   She has not had any recent dental work but has intermittent problems with decay molar on left lower jaw  She is a single mom, has twin 9yo boys, one of which is autistic. 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 \ No recent health problems, last year had rotator cuff repair and meniscus repair to knee  Past Medical History  Diagnosis Date  . Asthma   . Rotator cuff tear   . Perimenopausal   .  Slipped intervertebral disc   . Meniscus tear   . Chronic knee pain     right  . [place]     Allergies:  Allergies  Allergen Reactions  . Diclofenac Sodium Itching  . Meloxicam Itching    MEDICATIONS: .  stroke: mapping our early stages of recovery book   Does not apply Once  . aspirin EC  81 mg Oral Daily  . enoxaparin (LOVENOX) injection  40 mg Subcutaneous Q24H    Social History  Substance Use Topics  . Smoking status: Never Smoker   . Smokeless tobacco: Never Used  . Alcohol Use: No    Family History  Problem Relation Age of Onset  . Cancer Mother     colon cancer  . Cancer Father     brain cancer  . Alcohol abuse Father   . Diabetes Father   - sister with leukemia  Review of Systems  Constitutional: Negative for fever, chills, diaphoresis, activity change, appetite change, fatigue and unexpected weight change.  HENT: Negative for congestion, sore throat, rhinorrhea, sneezing, trouble swallowing and sinus pressure.  Eyes: Negative for photophobia and visual disturbance.  Respiratory: Negative for cough, chest tightness, shortness of breath, wheezing and stridor.  Cardiovascular: Negative for chest pain, palpitations and leg swelling.  Gastrointestinal: Negative for nausea, vomiting, abdominal pain, diarrhea, constipation, blood in stool, abdominal distention and anal bleeding.  Genitourinary: Negative for dysuria, hematuria, flank pain and difficulty urinating.  Musculoskeletal: Negative for myalgias, back pain, joint swelling, arthralgias and gait problem.  Skin: Negative for color change, pallor, rash and wound.  Neurological: + headache, and left sided weakness Hematological: Negative for adenopathy. Does not bruise/bleed easily.  Psychiatric/Behavioral: Negative for behavioral problems, confusion, sleep disturbance, dysphoric mood, decreased concentration and agitation.     OBJECTIVE: Temp:  [97.7 F (36.5 C)-98.1 F (36.7 C)] 97.9 F  (36.6 C) (01/12 1411) Pulse Rate:  [62-81] 81 (01/12 1411) Resp:  [14-20] 18 (01/12 1411) BP: (109-137)/(51-81) 125/51 mmHg (01/12 1411) SpO2:  [95 %-99 %] 97 % (01/12 1411) Weight:  [250 lb (113.399 kg)] 250 lb (113.399 kg) (01/11 1902) Physical Exam  Constitutional:  oriented to person, place, and time. appears well-developed and well-nourished. No distress.  HENT: /AT, PERRLA, no scleral icterus Mouth/Throat: Oropharynx is clear and moist. No oropharyngeal exudate.  Cardiovascular: Normal rate, regular rhythm and normal heart sounds. Exam reveals no gallop and no friction rub.  No murmur heard.  Pulmonary/Chest: Effort normal and breath sounds normal. No respiratory distress.  has no wheezes.  Neck = supple, no nuchal rigidity Abdominal: Soft. Bowel sounds are normal.  exhibits no distension. There is no tenderness.  Lymphadenopathy: no cervical adenopathy. No axillary adenopathy Neurological: alert and oriented to person, place, and time. Left grip strenght slighly less 4+/ with right grip as 5/5. Slight decrease sensation to left upper arm. Otherwise CN2-10 grossly intact Skin: Skin is warm and dry. No rash noted. No erythema.  Psychiatric: a normal mood and affect.  behavior is normal.   LABS: Results for orders placed or performed during the hospital encounter of 09/28/15 (from the past 48 hour(s))  CBC with Differential     Status: Abnormal   Collection Time: 09/28/15  4:12 PM  Result Value Ref Range   WBC 4.4 4.0 - 10.5 K/uL   RBC 3.78 (L) 3.87 - 5.11 MIL/uL   Hemoglobin 12.2 12.0 - 15.0 g/dL   HCT 36.1 36.0 - 46.0 %   MCV 95.5 78.0 - 100.0 fL   MCH 32.3 26.0 - 34.0 pg   MCHC 33.8 30.0 - 36.0 g/dL   RDW 12.2 11.5 - 15.5 %   Platelets 276 150 - 400 K/uL   Neutrophils Relative % 44 %   Neutro Abs 2.0 1.7 - 7.7 K/uL   Lymphocytes Relative 44 %   Lymphs Abs 1.9 0.7 - 4.0 K/uL   Monocytes Relative 6 %   Monocytes Absolute 0.3 0.1 - 1.0 K/uL   Eosinophils Relative 5 %    Eosinophils Absolute 0.2 0.0 - 0.7 K/uL   Basophils Relative 1 %   Basophils Absolute 0.0 0.0 - 0.1 K/uL  Basic metabolic panel     Status: None   Collection Time: 09/28/15  4:12 PM  Result Value Ref Range   Sodium 143 135 - 145 mmol/L   Potassium 3.7 3.5 - 5.1 mmol/L   Chloride 109 101 - 111 mmol/L   CO2 27 22 - 32 mmol/L   Glucose, Bld 97 65 - 99 mg/dL   BUN 9 6 - 20 mg/dL   Creatinine, Ser 0.74 0.44 - 1.00 mg/dL   Calcium 8.9 8.9 - 10.3 mg/dL   GFR calc non Af Amer >60 >60 mL/min   GFR calc Af Amer >60 >60 mL/min    Comment: (NOTE) The eGFR has been calculated using the CKD EPI equation. This calculation has not been validated in  all clinical situations. eGFR's persistently <60 mL/min signify possible Chronic Kidney Disease.    Anion gap 7 5 - 15  Lipid panel     Status: Abnormal   Collection Time: 09/29/15  5:10 AM  Result Value Ref Range   Cholesterol 146 0 - 200 mg/dL   Triglycerides 117 <150 mg/dL   HDL 35 (L) >40 mg/dL   Total CHOL/HDL Ratio 4.2 RATIO   VLDL 23 0 - 40 mg/dL   LDL Cholesterol 88 0 - 99 mg/dL    Comment:        Total Cholesterol/HDL:CHD Risk Coronary Heart Disease Risk Table                     Men   Women  1/2 Average Risk   3.4   3.3  Average Risk       5.0   4.4  2 X Average Risk   9.6   7.1  3 X Average Risk  23.4   11.0        Use the calculated Patient Ratio above and the CHD Risk Table to determine the patient's CHD Risk.        ATP III CLASSIFICATION (LDL):  <100     mg/dL   Optimal  100-129  mg/dL   Near or Above                    Optimal  130-159  mg/dL   Borderline  160-189  mg/dL   High  >190     mg/dL   Very High Performed at [facility]: none IMAGING: Ct Head Wo Contrast  09/28/2015  CLINICAL DATA:  Headache.  Left arm numbness. EXAM: CT HEAD WITHOUT CONTRAST TECHNIQUE: Contiguous axial images were obtained from the base of the skull through the vertex without intravenous contrast. COMPARISON:  None.  FINDINGS: Skull and Sinuses:Negative for fracture or destructive process. Lobulated mucosal thickening in the maxillary sinuses, likely retention cysts. Large middle concha bullosa on the right with the septal deviation to the left. Visualized orbits: Negative. Brain: There is cortical and subcortical low-density in the high right parietal lobe with local sulcal effacement. Density overlapping the occipital horn of the right lateral ventricle has a linear non layering appearance and may be artifact/volume-averaging. No convincing hemorrhage. No hydrocephalus or shift. These results were called by telephone at the time of interpretation on 09/28/2015 at 4:42 pm to Dr. Bernerd Limbo , who verbally acknowledged these results. IMPRESSION: 1. Right parietal abnormality which could reflect small infarct or vasogenic edema (for mass or inflammation). Especially given reported deficit, recommend brain MRI with contrast. 2. Indeterminate density at occipital horn right lateral ventricle, attention on follow-up. Electronically Signed   By: Monte Fantasia M.D.   On: 09/28/2015 16:44   Mr Jeri Cos YQ Contrast  09/28/2015  CLINICAL DATA:  Acute presentation with headache and left arm weakness. Abnormal head CT EXAM: MRI HEAD WITHOUT AND WITH CONTRAST TECHNIQUE: Multiplanar, multiecho pulse sequences of the brain and surrounding structures were obtained without and with intravenous contrast. CONTRAST:  54m MULTIHANCE GADOBENATE DIMEGLUMINE 529 MG/ML IV SOLN COMPARISON:  Head CT same day FINDINGS: There is a 1 cm lesion in the right parietal cortical/subcortical brain with surrounding edema. This appears to be a round well-circumscribed lesion with thin internal cystic portion. The abnormality shows restricted diffusion. The abnormality shows ring enhancement. 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 This does  not represent an infarction. Pyogenic abscess is possible is well. The remainder of  the study is normal except for a few insignificant punctate white matter foci in the frontal lobes. No hydrocephalus. No extra-axial collection. No sign of brain hemorrhage. IMPRESSION: 1 cm ring-enhancing lesion in the right parietal cortical and subcortical brain with surrounding vasogenic edema. 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 Necrotic brain metastasis is also possible but least likely. Electronically Signed   By: Nelson Chimes M.D.   On: 09/28/2015 20:19    Assessment/Plan:  50yo F who presents with right sided headache, left arm weakness found to have 1cm right parietal ring enhancing lesion with surrounding edema which could be due to brain abscess, mTb, possibly NCC, thought doesn't have risk factors for the latter.  - recommend to get blood cx - will place ppd to see if exposure to mTB - though it may look like NCC, patient really has not travel to endemic areas for [place]. Would think this is less, likely agree with getting ab testing.has very mild eosinophilia of tlc 200 - recommend to get LP to see if CSF has pleocytosis to consider more c/w brain abscess. Would get cell count, cr ag, aerobic cx, fungal cx, afb cx. Needs high volume tap.  - would not start empiric abtx at this time until we have further information. Patient is stable - would get panarex to see if , serum CrAg  Macrina Lehnert B. [facility] for Infectious Diseases [phone]

## 2015-09-29 NOTE — Procedures (Signed)
ELECTROENCEPHALOGRAM REPORT  Patient: [name]       Room #: [id number] EEG No. ID: [id number] Age: 50 y.o.        Sex: female Referring Physician: Wardell Heath Report Date:  09/29/2015        Interpreting Physician: Anthony Sar  History: [name] is an 50 y.o. female presented with left-sided weakness and numbness. MRI showed ring-enhancing systolic right parietal lesion.  Indications for study:  Rule out seizure disorder.  Technique: This is an 18 channel routine scalp EEG performed at the bedside with bipolar and monopolar montages arranged in accordance to the international 10/20 system of electrode placement.   Description: This EEG recording was performed during wakefulness. Predominant background activity consisted of 9-10 Hz symmetrical alpha rhythm recorded from the posterior head region. Photic stimulation was not performed. No epileptiform discharges were recorded.  Interpretation: This is a normal EEG recording during wakefulness. No evidence of an epileptic disorder was demonstrated. The absence of epileptiform activity during EEG recording does not necessarily rule out seizure disorder, however.   Rush Farmer M.D. [facility] [phone]

## 2015-09-29 NOTE — Progress Notes (Signed)
Subjective: Patient had no new complaints. Numbness has resolved. Feeling of heaviness in left upper extremity is diminished, but not completely resolved. She had a headache earlier today.  Objective: Current vital signs: BP 109/64 mmHg  Pulse 74  Temp(Src) 97.9 F (36.6 C) (Oral)  Resp 18  Ht 5\' 9"  (1.753 m)  Wt 113.399 kg (250 lb)  BMI 36.90 kg/m2  SpO2 97%  LMP 09/27/2011  Neurologic Exam: Alert and in no acute distress. Mental status was normal. Extraocular movements were full and conjugate. No facial weakness noted. Speech was normal. Motor exam was normal including no weakness or drift of left upper extremity. Coordination of upper extremities was normal.  MRI of the brain showed a 1 cm ring-enhancing lesion involving the right parietal cortex and subcortical area, with surrounding vasogenic edema.  Medications: I have reviewed the patient's current medications.  Assessment/Plan: 50 year old lady presenting with headache as well as transient left extremity weakness and numbness, with MRI abnormality as described above. Etiologic considerations include neurocysticercosis, brain abscess or necrotic tumor metastasis. Symptoms experienced yesterday or less likely cerebrovascular in etiology, and may well have been manifestations of partial seizure.  Recommendations: 1. Agree with obtaining ID consultation 2. Neurosurgery consultation 3. EEG, routine about study 4. Will defer treatment with Decadron for vasogenic edema, for now, as there is no significant mass effect on adjacent brain structures.  We will continue to follow this patient closely with you.  C.R. Nicole Kindred, MD [facility] [phone]  09/29/2015  9:57 AM

## 2015-09-29 NOTE — Progress Notes (Signed)
OT Cancellation Note  Patient Details Name: [name] MRN: [id number] DOB: October 23, 1965   Cancelled Treatment:    Reason Eval/Treat Not Completed: Medical issues which prohibited therapy. Noted neurosurgery consult is pending.  Will check back  Lillyrose Reitan 09/29/2015, 10:42 AM  Lesle Chris, OTR/L [phone] 09/29/2015

## 2015-09-29 NOTE — Progress Notes (Signed)
PT Cancellation Note  Patient Details Name: [name] MRN: [id number] DOB: Mar 25, 1966   Cancelled Treatment:    Reason Eval/Treat Not Completed: Medical issues which prohibited therapy (chart reviewed, noted MRI results. will check back later. )   [name] 09/29/2015, 9:31 AM [name] PT [phone]

## 2015-09-29 NOTE — Progress Notes (Signed)
Echocardiogram 2D Echocardiogram has been performed.  Tresa Res 09/29/2015, 1:23 PM

## 2015-09-30 ENCOUNTER — Other Ambulatory Visit: Payer: Self-pay | Admitting: Neurosurgery

## 2015-09-30 DIAGNOSIS — G939 Disorder of brain, unspecified: Secondary | ICD-10-CM | POA: Diagnosis not present

## 2015-09-30 DIAGNOSIS — R51 Headache: Secondary | ICD-10-CM | POA: Diagnosis not present

## 2015-09-30 DIAGNOSIS — G459 Transient cerebral ischemic attack, unspecified: Secondary | ICD-10-CM | POA: Diagnosis not present

## 2015-09-30 LAB — TOXOPLASMA ANTIBODIES- IGG AND  IGM: Toxoplasma IgG Ratio: <3 IU/mL (ref 0.0–7.1)

## 2015-09-30 LAB — PROTIME-INR
INR: 1.01 (ref 0.00–1.49)
PROTHROMBIN TIME: 13.5 s (ref 11.6–15.2)

## 2015-09-30 LAB — CRYPTOCOCCAL ANTIGEN: Crypto Ag: NEGATIVE

## 2015-09-30 LAB — HEMOGLOBIN A1C
HEMOGLOBIN A1C: 6.1 % — AB (ref 4.8–5.6)
MEAN PLASMA GLUCOSE: 128 mg/dL

## 2015-09-30 LAB — HIV ANTIBODY (ROUTINE TESTING W REFLEX): HIV SCREEN 4TH GENERATION: NONREACTIVE

## 2015-09-30 NOTE — Progress Notes (Signed)
SLP Cancellation Note  Patient Details Name: [name] MRN: [id number] DOB: 08-29-1966   Cancelled treatment:       Reason Eval/Treat Not Completed: SLP screened, no needs identified, will sign off   Dannial Monarch 09/30/2015, 3:52 PM   Sonia Baller, MA, CCC-SLP 09/30/2015 3:52 PM

## 2015-09-30 NOTE — Progress Notes (Addendum)
PROGRESS NOTE  [name] [id number] DOB: [birth year] DOA: 09/28/2015 PCP: Harle Battiest, MD  HPI: [name] is a 50 y.o. femalewithout significant medical history, who presented to the emergency room with a chief complaint of headache on and off x4 days, and new onset left arm and left leg numbness and weakness.  Subjective / 24 H Interval events She is doing well today, still with slight left arm weakness but she is able to ambulate without difficulty. She had another headache earlier today which improved after Percocet. She is anxious about the LP later today but denies chest pain, palpitations, or dyspnea.  Assessment/Plan:  Newly noted 1cm ring enhancing lesion on MRI - Headache controlled with Percocet, no numbness today, still with very slight weakness LUE - MRI brain shows 1cm right parietal cortical/subcortical lesion, well circumscribed with thin internal cystic portion and ring enhancement. 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 No HIV risk factors identified, no recent travel, no regular contact with cats/cat feces. - Continue aspirin for now, and percocet PRN for headache - No plans for Decadron at this time, no significant mass effect on MRI and symptoms improved. - LP bedside unsuccessful due to patient's anxiety and agitation. Discussed with Dr. Titus Mould from PCCM - discussed with neurology, ID - touched base with Dr. Joya Salm from neurosurgery, he could potentially biopsy the lesion on Monday   Palpitations, suspect related to anxiety, now resolved - EKG reviewed, NSR - Echo with EF 65-70%, grade 1 DD  Intermittent Asthma - well controlled, no dyspnea or wheezing   Diet: Regular  Fluids: None DVT Prophylaxis: Holding lovenox for LP today  Code Status:  Full Code Family Communication: None at bedside. The patient's sister is a Podiatrist who will be visiting later today. Disposition Plan: Likely discharge to home when ready Barriers to discharge: Awaiting infectious workup, Neurosurgery and Infectious Disease recommendations.  Consultants:  Neurology  Infectious Disease  Neurosurgery  Procedures:  2D echo Study Conclusions - Left ventricle: The cavity size was normal. There was moderateconcentric hypertrophy. Systolic function was vigorous. The estimated ejection fraction was in the range of 65% to 70%. Wallmotion was normal; there were no regional wall motionabnormalities. Doppler parameters are consistent with abnormalleft ventricular relaxation (grade 1 diastolic dysfunction).There was no evidence of elevated ventricular filling pressure byDoppler parameters. - Aortic valve: There was no regurgitation. - Aortic root: The aortic root was normal in size. - Left atrium: The atrium was normal in size. - Right ventricle: Systolic function was normal. - Tricuspid valve: There was trivial regurgitation. - Pulmonic valve: There was no regurgitation. - Pulmonary arteries: Systolic pressure was within the normalrange. - Inferior vena cava: The vessel was normal in size. - Pericardium, extracardiac: There was no pericardial effusion.   Antibiotics None   Studies  Ct Head Wo Contrast  09/28/2015  CLINICAL DATA:  Headache.  Left arm numbness. EXAM: CT HEAD WITHOUT CONTRAST TECHNIQUE: Contiguous axial images were obtained from the base of the skull through the vertex without intravenous contrast. COMPARISON:  None. FINDINGS: Skull and Sinuses:Negative for fracture or destructive process. Lobulated mucosal thickening in the maxillary sinuses, likely retention cysts. Large middle concha bullosa on the right with the septal deviation to the left. Visualized orbits: Negative. Brain: There is cortical and subcortical low-density in the  high right parietal lobe with local sulcal  effacement. Density overlapping the occipital horn of the right lateral ventricle has a linear non layering appearance and may be artifact/volume-averaging. No convincing hemorrhage. No hydrocephalus or shift. These results were called by telephone at the time of interpretation on 09/28/2015 at 4:42 pm to Dr. Bernerd Limbo , who verbally acknowledged these results. IMPRESSION: 1. Right parietal abnormality which could reflect small infarct or vasogenic edema (for mass or inflammation). Especially given reported deficit, recommend brain MRI with contrast. 2. Indeterminate density at occipital horn right lateral ventricle, attention on follow-up. Electronically Signed   By: Monte Fantasia M.D.   On: 09/28/2015 16:44   Mr Jeri Cos [id number] Contrast  09/28/2015  CLINICAL DATA:  Acute presentation with headache and left arm weakness. Abnormal head CT EXAM: MRI HEAD WITHOUT AND WITH CONTRAST TECHNIQUE: Multiplanar, multiecho pulse sequences of the brain and surrounding structures were obtained without and with intravenous contrast. CONTRAST:  66mL MULTIHANCE GADOBENATE DIMEGLUMINE 529 MG/ML IV SOLN COMPARISON:  Head CT same day FINDINGS: There is a 1 cm lesion in the right parietal cortical/subcortical brain with surrounding edema. This appears to be a round well-circumscribed lesion with thin internal cystic portion. The abnormality shows restricted diffusion. The abnormality shows ring enhancement. 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 This does not represent an infarction. Pyogenic abscess is possible is well. The remainder of the study is normal except for a few insignificant punctate white matter foci in the frontal lobes. No hydrocephalus. No extra-axial collection. No sign of brain hemorrhage. IMPRESSION: 1 cm ring-enhancing lesion in the right parietal cortical and subcortical brain with surrounding vasogenic edema. 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 Necrotic brain metastasis is also possible but least likely. Electronically Signed   By: Nelson Chimes M.D.   On: 09/28/2015 20:19    Objective  Filed Vitals:   09/30/15 0210 09/30/15 0445 09/30/15 0700 09/30/15 1105  BP: 125/57 123/65 130/61 130/77  Pulse: 69 74 68 64  Temp: 97.9 F (36.6 C) 97.8 F (36.6 C) 97.8 F (36.6 C) 97.7 F (36.5 C)  TempSrc: Oral Oral Oral Oral  Resp: 18 18 18 18   Height:      Weight:      SpO2: 96% 100% 98% 96%   No intake or output data in the 24 hours ending 09/30/15 1159 Filed Weights   09/28/15 1902  Weight: 113.399 kg (250 lb)    Exam:  GENERAL: NAD, clear speech  HEENT: no scleral icterus, PERRL, EOMI, Molar discoloration left lower jaw - no obvious abscess formation or tenderness to palpation.  NECK: supple  LUNGS: good inspiratory effort, CTA biL, no wheezing  HEART: RRR without MRG  ABDOMEN: soft, non tender, nondistended, no gaurding  EXTREMITIES: no clubbing / cyanosis, no peripheral edema  NEUROLOGIC: LUE strength slightly less than RUE, BLE strength 5/5   Data Reviewed: Basic Metabolic Panel:  Recent Labs Lab 09/28/15 1612  NA 143  K 3.7  CL 109  CO2 27  GLUCOSE 97  BUN 9  CREATININE 0.74  CALCIUM 8.9   CBC:  Recent Labs Lab 09/28/15 1612  WBC 4.4  NEUTROABS 2.0  HGB 12.2  HCT 36.1  MCV 95.5  PLT 276    Scheduled Meds: . aspirin EC  81 mg Oral Daily  . tuberculin  5 Units Intradermal Once    Marzetta Board, MD [facility] Pager [phone]. If 7 PM - 7 AM, please contact night-coverage at [email or web address], password  [email or web address] 09/30/2015, 11:59 AM

## 2015-09-30 NOTE — Progress Notes (Signed)
PT Cancellation Note  Patient Details Name: [name] MRN: [id number] DOB: [birth year]   Cancelled Treatment:    Reason Eval/Treat Not Completed: Patient not medically ready--just back form attempted LP and on bedrest per RN; will attempt again as schedule permits   [facility] 09/30/2015, 1:51 PM

## 2015-09-30 NOTE — Procedures (Signed)
LP  Placed upright, hunched over Sterile technique lidocaine 1% 7 cc injected Placed needle L3-L4 After entry about to place through space Pt developed acute anxiety, panic attack Maybe vasovagal Reported diaphorsis amd anxiety and halted procedure layed flat VSS She apologized that she could not complete procedure Blood loss less 0.1 cc Consider IR with sedation  Never entered csf, no need lay flat I called and d/w Primary  Lavon Paganini. Titus Mould, MD, [place] Pgr: [facility]

## 2015-09-30 NOTE — Progress Notes (Signed)
Subjective: Feels her left arm is still slightly heavy.  Had a episode where she felt a little "off" this AM" but now better.   Exam: Filed Vitals:   09/30/15 0445 09/30/15 0700  BP: 123/65 130/61  Pulse: 74 68  Temp: 97.8 F (36.6 C) 97.8 F (36.6 C)  Resp: 18 18    HEENT-  Normocephalic, no lesions, without obvious abnormality.  Normal external eye and conjunctiva.  Normal TM's bilaterally.  Normal auditory canals and external ears. Normal external nose, mucus membranes and septum.  Normal pharynx. Cardiovascular- S1, S2 normal, pulses palpable throughout   Lungs- chest clear, no wheezing, rales, normal symmetric air entry Abdomen- normal findings: bowel sounds normal Extremities- no edema     Gen: In bed, NAD MS: alert and oriented, speech clear CN: grossly intact Motor: slightly weaker 4/5 on the left arm but otherwise 5/5 Sensory: intact throughout   Pertinent Labs: Echo normal A1c 6.1 LDL 88 EEG showed no epileptiform activity Carotid doppler showed no ICA stenosis  Etta Quill PA-C [facility] [phone]  Impression: 50 year old lady presenting with headache as well as transient left extremity weakness and numbness.  MRI showed 1 CM ring enhancing lesion on the right parietal cortex and subcortical region with vasogenic edema. ID involved and recommending LP. Patietn had Lovenox at 2244 and this will need to be held for 24 hours.  neurosurgical consult pending.    Recommendations: 1) At this point PCCM will be doing LP 2) no further recommendations from neurology.  Neurology will S/O    09/30/2015, 8:38 AM

## 2015-09-30 NOTE — Progress Notes (Signed)
OT Cancellation Note  Patient Details Name: [name] MRN: [id number] DOB: 07-16-1966   Cancelled Treatment:    Reason Eval/Treat Not Completed: Other (comment). Text paged MD for activity clarification. Pt is awaiting LP. Will wait until after this, unless we are notified to see her before.  [name] 09/30/2015, 1:35 PM  Lesle Chris, OTR/L [phone] 09/30/2015

## 2015-09-30 NOTE — Progress Notes (Signed)
[facility] for Infectious Disease    Date of Admission:  09/28/2015   Total days of antibiotics 0  ID: [name] is a 50 y.o. female with right sided headache, left hand weakness found to have right parietal parenchymal lesions Active Problems:   TIA (transient ischemic attack)   Headache   Brain lesion    Subjective: Afebrile, still has constant headache treated with percocet. Still feels some numbness to left  Arm  Interval hx: attempted bedside LP without success  Medications:  . aspirin EC  81 mg Oral Daily  . tuberculin  5 Units Intradermal Once    Objective: Vital signs in last 24 hours: Temp:  [97.7 F (36.5 C)-97.9 F (36.6 C)] 97.8 F (36.6 C) (01/13 1500) Pulse Rate:  [61-74] 70 (01/13 1500) Resp:  [18-20] 18 (01/13 1500) BP: (112-135)/(47-80) 134/80 mmHg (01/13 1500) SpO2:  [96 %-100 %] 98 % (01/13 1500) Physical Exam  Constitutional:  oriented to person, place, and time. appears well-developed and well-nourished. No distress.  HENT: [place]/AT, PERRLA, no scleral icterus Mouth/Throat: Oropharynx is clear and moist. No oropharyngeal exudate.  Cardiovascular: Normal rate, regular rhythm and normal heart sounds. Exam reveals no gallop and no friction rub.  No murmur heard.  Pulmonary/Chest: Effort normal and breath sounds normal. No respiratory distress.  has no wheezes.  Neck = supple, no nuchal rigidity Lymphadenopathy: no cervical adenopathy. No axillary adenopathy Neurological: alert and oriented to person, place, and time. Trace weakness to left grip strength Skin: Skin is warm and dry. No rash noted. No erythema.  Psychiatric: a normal mood and affect.  behavior is normal.    Lab Results  Recent Labs  09/28/15 1612  WBC 4.4  HGB 12.2  HCT 36.1  NA 143  K 3.7  CL 109  CO2 27  BUN 9  CREATININE 0.74    Microbiology: toxo ig g negative hiv negative Crypto ag negative Blood cx 1/12 pending  Studies/Results: Mr Jeri Cos Wo  Contrast  09/28/2015  CLINICAL DATA:  Acute presentation with headache and left arm weakness. Abnormal head CT EXAM: MRI HEAD WITHOUT AND WITH CONTRAST TECHNIQUE: Multiplanar, multiecho pulse sequences of the brain and surrounding structures were obtained without and with intravenous contrast. CONTRAST:  6mL MULTIHANCE GADOBENATE DIMEGLUMINE 529 MG/ML IV SOLN COMPARISON:  Head CT same day FINDINGS: There is a 1 cm lesion in the right parietal cortical/subcortical brain with surrounding edema. This appears to be a round well-circumscribed lesion with thin internal cystic portion. The abnormality shows restricted diffusion. The abnormality shows ring enhancement. 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 This does not represent an infarction. Pyogenic abscess is possible is well. The remainder of the study is normal except for a few insignificant punctate white matter foci in the frontal lobes. No hydrocephalus. No extra-axial collection. No sign of brain hemorrhage. IMPRESSION: 1 cm ring-enhancing lesion in the right parietal cortical and subcortical brain with surrounding vasogenic edema. 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 Necrotic brain metastasis is also possible but least likely. Electronically Signed   By: Nelson Chimes M.D.   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 Spoke with dr. Jola Baptist who feels less likely Pence which i agree since patient has no risk factors for getting neurocystercercosis. Ab testing is pending. PPD of right forearm is non reactive at 24hr. Spoke with dr. Cruzita Lederer, and dr. Joya Salm for plan  to do brain bx/aspirate  Recommend to send brain tissue for culture and if anything remaining for path/cytology  Recommend to see if can get dental evaluation at [facility] to evaluate for molar extraction as this is possible cause for pyogenic brain abscess  Will  have ID follow up on [place], [facility] for Infectious Diseases Cell: [phone] Pager: [phone]  09/30/2015, 5:51 PM

## 2015-10-01 ENCOUNTER — Observation Stay (HOSPITAL_COMMUNITY): Payer: Medicaid Other

## 2015-10-01 DIAGNOSIS — G44001 Cluster headache syndrome, unspecified, intractable: Secondary | ICD-10-CM | POA: Diagnosis not present

## 2015-10-01 DIAGNOSIS — G939 Disorder of brain, unspecified: Secondary | ICD-10-CM | POA: Diagnosis not present

## 2015-10-01 LAB — COMPREHENSIVE METABOLIC PANEL
ALBUMIN: 4.3 g/dL (ref 3.5–5.0)
ALK PHOS: 78 U/L (ref 38–126)
ALT: 12 U/L — ABNORMAL LOW (ref 14–54)
ANION GAP: 8 (ref 5–15)
AST: 19 U/L (ref 15–41)
BUN: 8 mg/dL (ref 6–20)
CALCIUM: 9.5 mg/dL (ref 8.9–10.3)
CO2: 26 mmol/L (ref 22–32)
Chloride: 110 mmol/L (ref 101–111)
Creatinine, Ser: 0.88 mg/dL (ref 0.44–1.00)
GFR calc non Af Amer: >60 mL/min (ref >60)
GLUCOSE: 119 mg/dL — AB (ref 65–99)
POTASSIUM: 3.8 mmol/L (ref 3.5–5.1)
Sodium: 144 mmol/L (ref 135–145)
Total Bilirubin: 0.8 mg/dL (ref 0.3–1.2)
Total Protein: 8.1 g/dL (ref 6.5–8.1)

## 2015-10-01 MED ORDER — ONDANSETRON 4 MG PO TBDP
4.0000 mg | ORAL_TABLET | Freq: Three times a day (TID) | ORAL | Status: DC | PRN
  Administered 2015-10-01 – 2015-10-07 (×5): 4 mg via ORAL
  Filled 2015-10-01 (×8): qty 1

## 2015-10-01 MED ORDER — ALPRAZOLAM 0.25 MG PO TABS
0.2500 mg | ORAL_TABLET | Freq: Three times a day (TID) | ORAL | Status: DC | PRN
  Administered 2015-10-01 – 2015-10-03 (×2): 0.25 mg via ORAL
  Filled 2015-10-01 (×2): qty 1

## 2015-10-01 NOTE — Progress Notes (Signed)
Patient c/o increased left sided weakness, feeling "weird".  Vitals taken and recorded in chart.  MD notified

## 2015-10-01 NOTE — Evaluation (Signed)
Physical Therapy Evaluation Patient Details Name: [name] MRN: [id number] DOB: 15-Jul-1966 Today's Date: 10/01/2015   History of Present Illness  [name] is a 50 y.o. femalewithout significant medical history, who presented to the emergency room 09/28/15 with a complaint of headache on and off x4 days, and new onset LUE  and  LLE numbness and weakness; amb for possible TIA  however MRI brain shows 1cm right parietal cortical/subcortical lesion, well circumscribed with thin internal cystic portion and ring enhancement. 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 Pt currently with functional limitations due to the deficits listed below (see PT Problem List).  Pt will benefit from skilled PT to increase their independence and safety with mobility to allow discharge to the venue listed below.    Pt cooperative with PT but very anxious despite having had xanax prior to therapy, activity limited d/t this and pt states her HA and weakness are worse this am; Will continue to follow, pt to transfer to [facility] for brain bx next week ([place] or Wednesday per pt)       Follow Up Recommendations Home health PT (?? depending on progress)    Equipment Recommendations  Other (comment) (TBA)    Recommendations for Other Services       Precautions / Restrictions Precautions Precautions: Fall      Mobility  Bed Mobility Overal bed mobility: Needs Assistance Bed Mobility: Sit to Supine       Sit to supine:  (pt on BSC with RN on PT arrival)   General bed mobility comments: min with LLE onto bed, incr time and cues for sequencing task  Transfers Overall transfer level: Needs assistance Equipment used: Rolling walker (2 wheeled) Transfers: Sit to/from [facility] Sit to Stand: Min assist Stand pivot transfers: Min assist       General transfer comment: assist for  balance and safety, cues for technique/sequence; requires incr time; pt able to grip walker during transfer although required assist to release  Ambulation/Gait             General Gait Details: NT  Stairs            Wheelchair Mobility    Modified Rankin (Stroke Patients Only)       Balance Overall balance assessment: Needs assistance Sitting-balance support: No upper extremity supported;Feet supported Sitting balance-Leahy Scale: Fair (at least)       Standing balance-Leahy Scale: Poor                               Pertinent Vitals/Pain Pain Assessment: 0-10 Pain Location: headache adn LP site Pain Descriptors / Indicators: Aching Pain Intervention(s): Limited activity within patient's tolerance;Monitored during session;Repositioned;Premedicated before session    Home Living Family/patient expects to be discharged to:: Private residence Living Arrangements: Children   Type of Home:  (loft/condo) Home Access: Stairs to enter   Technical brewer of Steps: flight  Home Layout: Two level Home Equipment: None      Prior Function Level of Independence: Independent               Hand Dominance        Extremity/Trunk Assessment   Upper Extremity Assessment: Defer to OT evaluation           Lower Extremity Assessment: LLE deficits/detail   LLE Deficits / Details: hip  flexors 2+/5, knee extension 3/5, knee flexion 3 to 3+/5, ankle AROM WFL although pt positions inslight inversion at rest;   Cervical / Trunk Assessment: Normal  Communication   Communication: No difficulties  Cognition Arousal/Alertness: Awake/alert Behavior During Therapy: Anxious Overall Cognitive Status: Within Functional Limits for tasks assessed Area of Impairment: Following commands;Problem solving       Following Commands: Follows one step commands with increased time     Problem Solving: Slow processing;Difficulty sequencing;Requires verbal  cues;Requires tactile cues General Comments: pt reports she feels as if she is in a "fog", reports a great deal of difficulty and incr time to perform simple tasks such as reaching for remote in her bed    General Comments      Exercises        Assessment/Plan    PT Assessment Patient needs continued PT services  PT Diagnosis Difficulty walking   PT Problem List Decreased strength;Decreased activity tolerance;Decreased mobility;Decreased balance;Decreased safety awareness;Decreased coordination;Impaired sensation  PT Treatment Interventions Gait training;Functional mobility training;Therapeutic activities;Patient/family education;Balance training;DME instruction;Therapeutic exercise   PT Goals (Current goals can be found in the Care Plan section) Acute Rehab PT Goals Patient Stated Goal: to know what is going on PT Goal Formulation: With patient Time For Goal Achievement: 10/15/15 Potential to Achieve Goals: Good    Frequency Min 3X/week   Barriers to discharge        Co-evaluation               End of Session Equipment Utilized During Treatment: Gait belt Activity Tolerance: Patient limited by pain;Patient limited by fatigue Patient left: in bed;with call bell/phone within reach;with bed alarm set Nurse Communication: Mobility status         Time:  -      Charges:         PT G CodesKenyon Ana 13-Oct-2015, 12:31 PM

## 2015-10-01 NOTE — Progress Notes (Addendum)
PROGRESS NOTE  [name] [id number] DOB: September 14, 1966 DOA: 09/28/2015 PCP: Harle Battiest, MD  HPI: [name] is a 50 y.o. femalewithout significant medical history, who presented to the emergency room with a chief complaint of headache on and off x4 days, and new onset left arm and left leg numbness and weakness.  Subjective / 24 H Interval events - no chest pain, shortness of breath, no abdominal pain, nausea or vomiting.   Assessment/Plan:  Newly noted 1cm ring enhancing lesion on MRI - Headache controlled with Percocet, no numbness today, still with very slight weakness LUE - MRI brain shows 1cm right parietal cortical/subcortical lesion, well circumscribed with thin internal cystic portion and ring enhancement. 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  - Continue aspirin for now, and percocet PRN for headache - No plans for Decadron at this time, no significant mass effect on MRI and symptoms improved. - LP bedside unsuccessful due to patient's anxiety and agitation. Discussed with Dr. Titus Mould from [facility] - discussed with neurology, ID and neurosurgery - plan for brain biopsy at [facility] on Monday  Palpitations, suspect related to anxiety, now resolved - EKG reviewed, NSR - Echo with EF 65-70%, grade 1 DD  Intermittent Asthma - well controlled, no dyspnea or wheezing   Diet: Regular  Fluids: None DVT Prophylaxis: SCD  Code Status: Full Code Family Communication: None at bedside. Discussed with patient's sister over the phone.  Disposition Plan: Likely discharge to home when ready Barriers to discharge: Awaiting infectious workup  Consultants:  Neurology - Dr. Rochele Pages  Infectious Disease - Dr. Baxter Flattery  Neurosurgery - Dr. Joya Salm  Procedures:  2D echo Study  Conclusions - Left ventricle: The cavity size was normal. There was moderateconcentric hypertrophy. Systolic function was vigorous. The estimated ejection fraction was in the range of 65% to 70%. Wallmotion was normal; there were no regional wall motionabnormalities. Doppler parameters are consistent with abnormalleft ventricular relaxation (grade 1 diastolic dysfunction).There was no evidence of elevated ventricular filling pressure byDoppler parameters. - Aortic valve: There was no regurgitation. - Aortic root: The aortic root was normal in size. - Left atrium: The atrium was normal in size. - Right ventricle: Systolic function was normal. - Tricuspid valve: There was trivial regurgitation. - Pulmonic valve: There was no regurgitation. - Pulmonary arteries: Systolic pressure was within the normalrange. - Inferior vena cava: The vessel was normal in size. - Pericardium, extracardiac: There was no pericardial effusion.   Antibiotics None   Studies  No results found.  Objective  Filed Vitals:   10/01/15 0022 10/01/15 0200 10/01/15 0700 10/01/15 1042  BP:  132/65 115/63 130/63  Pulse:  73 78 72  Temp:  98.2 F (36.8 C) 98.2 F (36.8 C)   TempSrc:  Oral Oral   Resp:  20 18   Height:      Weight:      SpO2: 95% 96% 95%     Intake/Output Summary (Last 24 hours) at 10/01/15 1043 Last data filed at 09/30/15 1840  Gross per 24 hour  Intake    120 ml  Output      0 ml  Net    120 ml   Filed Weights   09/28/15 1902  Weight: 113.399 kg (250 lb)    Exam:  GENERAL: NAD, clear speech  HEENT: no scleral icterus, PERRL, EOMI,  Molar discoloration left lower jaw - no obvious abscess formation or tenderness to palpation.  NECK: supple  LUNGS: good inspiratory effort, CTA biL, no wheezing  HEART: RRR without MRG  ABDOMEN: soft, non tender, nondistended, no gaurding  EXTREMITIES: no clubbing / cyanosis, no peripheral edema  NEUROLOGIC: LUE strength slightly less  than RUE, BLE strength 5/5   Data Reviewed: Basic Metabolic Panel:  Recent Labs Lab 09/28/15 1612  NA 143  K 3.7  CL 109  CO2 27  GLUCOSE 97  BUN 9  CREATININE 0.74  CALCIUM 8.9   CBC:  Recent Labs Lab 09/28/15 1612  WBC 4.4  NEUTROABS 2.0  HGB 12.2  HCT 36.1  MCV 95.5  PLT 276    Scheduled Meds: . aspirin EC  81 mg Oral Daily  . tuberculin  5 Units Intradermal Once    Time spent: 25 minutes, more than 50% bedside discussing with patient, answering multiple questions and on phone conference in the room with patient's sister.  Marzetta Board, MD [facility] Pager [phone]. If 7 PM - 7 AM, please contact night-coverage at [email or web address], password [facility] 10/01/2015, 10:43 AM

## 2015-10-01 NOTE — Progress Notes (Signed)
Transferred out of [facility] [employer] to [employer] [facility] via carelink.

## 2015-10-01 NOTE — Progress Notes (Signed)
Called report to [place] on [facility] at [facility]

## 2015-10-01 NOTE — Progress Notes (Signed)
OT Cancellation Note  Patient Details Name: [name] MRN: [id number] DOB: 03-22-1966   Cancelled Treatment:    Reason Eval/Treat Not Completed: Patient not medically ready. Talked with RN prior to attempting to see patient. RN stated PT just worked with her and plan is for patient to be transferred to [facility] for further evaluation and work-up. RN states pt with increased left sided weakness and neurological changes and that MD has been notified. RN also reports pt with increased anxiety. Will wait for OT eval/treat right now and check back as schedule allows and when appropriate. Thank you for the order.  Chrys Racer , MS, OTR/L, CLT Pager: [phone]  10/01/2015, 12:10 PM

## 2015-10-01 NOTE — Progress Notes (Signed)
Transfer report received from [facility] at 1342 and pt arrived to the unit via carelink at 1500. VSS; pt A&O x4; pt oriented to the unit and room; skin dry and intact with no pressure ulcer or wounds noted. Pt in bed comfortably with call light within reach and family at bedside. Fall precaution and prevention education completed with pt and pt voices understanding. Will continue to closely monitor pt. Delia Heady RN

## 2015-10-02 DIAGNOSIS — G060 Intracranial abscess and granuloma: Secondary | ICD-10-CM | POA: Diagnosis not present

## 2015-10-02 DIAGNOSIS — G44009 Cluster headache syndrome, unspecified, not intractable: Secondary | ICD-10-CM | POA: Diagnosis not present

## 2015-10-02 DIAGNOSIS — G44019 Episodic cluster headache, not intractable: Secondary | ICD-10-CM | POA: Diagnosis not present

## 2015-10-02 DIAGNOSIS — G939 Disorder of brain, unspecified: Secondary | ICD-10-CM | POA: Diagnosis not present

## 2015-10-02 NOTE — Consult Note (Signed)
Reason for Consult:mass in brain Referring Physician: ID-hospitalist  [name] is an 50 y.o. female.  HPI: patient who suddenly developed weakness and paresthesias in the left arm and fell. At [facility] the w/u showed a lesion in the right parietal area. ID was consulted. They were unable to do a lp. [facility]  Past Medical History  Diagnosis Date  . Asthma   . Rotator cuff tear   . Perimenopausal   . Slipped intervertebral disc   . Meniscus tear   . Chronic knee pain     right  . Rheumatoid arthritis [facility])     Past Surgical History  Procedure Laterality Date  . Cesarean section    . Oophorectomy    . Tubal ligation    . Shoulder surgery      Family History  Problem Relation Age of Onset  . Cancer Mother     colon cancer  . Cancer Father     brain cancer  . Alcohol abuse Father   . Diabetes Father     Social History:  reports that she has never smoked. She has never used smokeless tobacco. She reports that she does not drink alcohol or use illicit drugs.  Allergies:  Allergies  Allergen Reactions  . Diclofenac Sodium Itching  . Meloxicam Itching    Medications: see HP  Results for orders placed or performed during the hospital encounter of 09/28/15 (from the past 48 hour(s))  Comprehensive metabolic panel     Status: Abnormal   Collection Time: 10/01/15 12:01 PM  Result Value Ref Range   Sodium 144 135 - 145 mmol/L   Potassium 3.8 3.5 - 5.1 mmol/L   Chloride 110 101 - 111 mmol/L   CO2 26 22 - 32 mmol/L   Glucose, Bld 119 (H) 65 - 99 mg/dL   BUN 8 6 - 20 mg/dL   Creatinine, Ser 0.88 0.44 - 1.00 mg/dL   Calcium 9.5 8.9 - 10.3 mg/dL   Total Protein 8.1 6.5 - 8.1 g/dL   Albumin 4.3 3.5 - 5.0 g/dL   AST 19 15 - 41 U/L   ALT 12 (L) 14 - 54 U/L   Alkaline Phosphatase 78 38 - 126 U/L   Total Bilirubin 0.8 0.3 - 1.2 mg/dL   GFR calc non Af Amer >60 >60 mL/min   GFR calc Af Amer >60 >60 mL/min    Comment: (NOTE) The eGFR has been  calculated using the CKD EPI equation. This calculation has not been validated in all clinical situations. eGFR's persistently <60 mL/min signify possible Chronic Kidney Disease.    Anion gap 8 5 - 15    Dg Orthopantogram  10/01/2015  CLINICAL DATA:  Tooth abscess. EXAM: ORTHOPANTOGRAM/PANORAMIC COMPARISON:  None. FINDINGS: Exam demonstrates the crown of a tooth over the left lower most posterior molar region with possible dental caries and without any associated roots. Possible subtle lucency adjacent the root of the left upper most posterior molar tooth. Remaining bones and soft tissues are unremarkable. IMPRESSION: Possible subtle lucency adjacent the root of the left most posterior upper molar tooth suggesting mild periodontal disease. Possible crown of tooth with dental carie over the most posterior left lower molar region without any associated roots. Recommend clinical correlation. Electronically Signed   By: Marin Olp M.D.   On: 10/01/2015 16:08    Review of Systems  Constitutional: Negative.   HENT:       Tooth ache  Eyes: Negative.  Respiratory: Negative.   Cardiovascular: Negative.   Genitourinary: Negative.   Musculoskeletal: Negative.   Skin: Negative.   Neurological: Positive for sensory change and focal weakness.  Endo/Heme/Allergies: Negative.   Psychiatric/Behavioral: Negative.    Blood pressure 116/55, pulse 73, temperature 97.8 F (36.6 C), temperature source Oral, resp. rate 18, height 5' 9"  (1.753 m), weight 113.399 kg (250 lb), last menstrual period 09/27/2011, SpO2 96 %. Physical Exam Neuro  Awake, oriented x3. Able to give me details of her events. The only finding is weakness of the left arm mostly at the hand with sensory changes Assessment/Plan: Right parietal biopsy by dr Carroll Kinds tomorrow pm. The procedure was explained to her but more details to be given by dr Ramon Dredge 10/02/2015, 1:23 PM

## 2015-10-02 NOTE — Progress Notes (Addendum)
Occupational Therapy Evaluation Patient Details Name: [name] MRN: [id number] DOB: [birth year] Today's Date: 10/02/2015    History of Present Illness [name] is a 50 y.o. femalewithout significant medical history, who presented to the emergency room 09/28/15 with a complaint of headache on and off x4 days, and new onset LUE  and  LLE numbness and weakness; amb for possible TIA  however MRI brain shows 1cm right parietal cortical/subcortical lesion, well circumscribed with thin internal cystic portion and ring enhancement. This could represent neurocyticercosis, abscess, or metastasis, less likely CVA      Clinical Impression   PTA, pt independent with ADL and mobility and worked as a Higher education careers adviser. Has an autistic son who is a twin. Pt presents with LUE weakness (giveaway) and sensory deficits. Will follow acutely for HEP if needed and educate pt on precautions due to sensory changes in LUE.     Follow Up Recommendations  No OT follow up;Supervision - Intermittent    Equipment Recommendations  Other (comment)    Recommendations for Other Services       Precautions / Restrictions Precautions Precautions: Fall      Mobility Bed Mobility Overal bed mobility: Modified Independent                Transfers Overall transfer level: Needs assistance   Transfers: Sit to/from Stand;Stand Pivot Transfers Sit to Stand: Supervision Stand pivot transfers: Supervision            Balance             Standing balance-Leahy Scale: Fair                              ADL Overall ADL's : Needs assistance/impaired                                     Functional mobility during ADLs: Supervision/safety General ADL Comments: Pt completing ADL but she states it takes her more time because she has to really think about what she is doing.  Do to fall PTA, discussed possible use of shower chair to reduce risk of falls. Pt plans to stay with  her sister after D/C. States family will be able to provide 24/7 S if needed.      Vision Additional Comments: Pt complains of increased blurred vision which does not clear up with readers   Perception     Praxis      Pertinent Vitals/Pain Pain Assessment: 0-10 Pain Score: 7  Pain Location: back and head Pain Descriptors / Indicators: Aching Pain Intervention(s): Limited activity within patient's tolerance     Hand Dominance Right   Extremity/Trunk Assessment Upper Extremity Assessment Upper Extremity Assessment: LUE deficits/detail LUE Deficits / Details: Pt demonstrates genralized weakness LUE. Inconsistent during strength assessment. - strength is functional for ADL  Apparent sensory deficits.  LUE Sensation: decreased light touch;decreased proprioception LUE Coordination: decreased fine motor  Will further assess after biopsy  As appropriate Lower Extremity Assessment Lower Extremity Assessment: Defer to PT evaluation   Cervical / Trunk Assessment Cervical / Trunk Assessment: Normal   Communication Communication Communication: No difficulties   Cognition Arousal/Alertness: Awake/alert Behavior During Therapy: WFL for tasks assessed/performed Overall Cognitive Status: Within Functional Limits for tasks assessed                 General Comments: Pt  reports feeling like it takes her longer to think about htings   General Comments       Exercises       Shoulder Instructions      Home Living Family/patient expects to be discharged to:: Private residence Living Arrangements: Children;Other relatives (sister) Available Help at Discharge: Family;Available 24 hours/day Type of Home: House Home Access: Stairs to enter [employer] of Steps: 1   Home Layout: One level     Bathroom Shower/Tub: Tub/shower unit;Walk-in shower   Bathroom Toilet: Standard Bathroom Accessibility: Yes How Accessible: Accessible via walker Home Equipment: None           Prior Functioning/Environment Level of Independence: Independent        Comments: works    OT Diagnosis: Other (comment);Generalized weakness (sensory deficits)   OT Problem List: Decreased strength;Decreased coordination;Impaired sensation   OT Treatment/Interventions: Self-care/ADL training;DME and/or AE instruction;Therapeutic activities;Patient/family education    OT Goals(Current goals can be found in the care plan section) Acute Rehab OT Goals Patient Stated Goal: to know what is going on OT Goal Formulation: With patient Time For Goal Achievement: 10/16/15 Potential to Achieve Goals: Good ADL Goals Additional ADL Goal #1: Pt will verbalize understadning of safety issues and use of compensation to accomodate for sensory changes in LUE  OT Frequency: Min 2X/week   Barriers to D/C:            Co-evaluation              End of Session Nurse Communication: Mobility status  Activity Tolerance: Patient tolerated treatment well Patient left: in bed;with call bell/phone within reach;with family/visitor present   Time: [id number] OT Time Calculation (min): 16 min Charges:  OT General Charges $OT Visit: 1 Procedure OT Evaluation $OT Eval Moderate Complexity: 1 Procedure G-Codes: OT G-codes **NOT FOR INPATIENT CLASS** Functional Assessment Tool Used: clinical judgement Functional Limitation: Self care Self Care Current Status [id number]): At least 1 percent but less than 20 percent impaired, limited or restricted Self Care Goal Status [id number]): At least 1 percent but less than 20 percent impaired, limited or restricted  [facility] 10/02/2015, 4:57 PM   [facility], OTR/L  [phone] 10/02/2015

## 2015-10-02 NOTE — Progress Notes (Signed)
Patient ID: [name], female   DOB: June 19, 1966, 50 y.o.   MRN: [id number] Spoke with patient. Note dictated. For brain biopsy tomorrow by dr Carroll Kinds

## 2015-10-02 NOTE — Progress Notes (Addendum)
PROGRESS NOTE  [name] [id number] DOB: October 28, 1965 DOA: 09/28/2015 PCP: Harle Battiest, MD  HPI: [name] is a 50 y.o. femalewithout significant medical history, who presented to the emergency room with a chief complaint of headache on and off x4 days, and new onset left arm and left leg numbness and weakness.  Subjective / 24 H Interval events  Patient remains in bed, denies any fever or chills, headaches improved, no focal weakness, no chest or abdominal pain, minimal numbness in the left upper extremity improved.  Assessment/Plan:  Headaches caused by Newly noted 1cm ring enhancing lesion on MRI  Likely infectious in etiology, she does have rotten tooth in the left lower jaw, HIV negative, toxoplasmosis negative, pending cysticercosis workup. Neurology, neurosurgery and ID following. Blood cultures negative echo stable.  Bedside LP failed, further workup as directed by neurology and neurosurgery. For now brain biopsy pending for 4 PM on 10/03/2015, continue aspirin, no role for Decadron per neurology and neurosurgery. Portal care for headache.   Palpitations, suspect related to anxiety, now resolved, normal sinus rhythm on EKG. Echo with EF 65-70%, grade 1 chronic and compensated diastolic dysfunction.   Intermittent Asthma  well controlled, no dyspnea or wheezing   Left lower jaw chronic tooth infection. We'll set her up with dental surgery postdischarge.   Diet: Regular  Fluids: None DVT Prophylaxis: SCD  Code Status: Full Code Family Communication: None at bedside. Discussed with patient's sister over the phone.  Disposition Plan: Likely discharge to home next day of brain biopsy if stable.   Consultants:  Neurology - Dr. Nicole Kindred  Infectious Disease - Dr. Baxter Flattery  Neurosurgery - Dr. Joya Salm  Procedures:  2D echo Study Conclusions - Left ventricle: The cavity size was normal. There was moderateconcentric hypertrophy. Systolic function was vigorous.  The estimated ejection fraction was in the range of 65% to 70%. Wallmotion was normal; there were no regional wall motionabnormalities. Doppler parameters are consistent with abnormalleft ventricular relaxation (grade 1 diastolic dysfunction).There was no evidence of elevated ventricular filling pressure byDoppler parameters. - Aortic valve: There was no regurgitation. - Aortic root: The aortic root was normal in size. - Left atrium: The atrium was normal in size. - Right ventricle: Systolic function was normal. - Tricuspid valve: There was trivial regurgitation. - Pulmonic valve: There was no regurgitation. - Pulmonary arteries: Systolic pressure was within the normalrange. - Inferior vena cava: The vessel was normal in size. - Pericardium, extracardiac: There was no pericardial effusion.   Antibiotics None   Studies  Dg Orthopantogram  10/01/2015  CLINICAL DATA:  Tooth abscess. EXAM: ORTHOPANTOGRAM/PANORAMIC COMPARISON:  None. FINDINGS: Exam demonstrates the crown of a tooth over the left lower most posterior molar region with possible dental caries and without any associated roots. Possible subtle lucency adjacent the root of the left upper most posterior molar tooth. Remaining bones and soft tissues are unremarkable. IMPRESSION: Possible subtle lucency adjacent the root of the left most posterior upper molar tooth suggesting mild periodontal disease. Possible crown of tooth with dental carie over the most posterior left lower molar region without any associated roots. Recommend clinical correlation. Electronically Signed   By: Marin Olp M.D.   On: 10/01/2015 16:08    Objective  Filed Vitals:   10/01/15 2223 10/02/15 0130 10/02/15 0535 10/02/15 0958  BP: 123/48 119/59 116/63 116/55  Pulse: 61 68 61 73  Temp: 98.2 F (36.8 C) 97.6 F (36.4 C) 98.5 F (36.9 C) 97.8 F (36.6 C)  TempSrc: Oral Oral Axillary  Oral  Resp: 18 18 18 18   Height:      Weight:      SpO2: 97%  95% 95% 96%    Intake/Output Summary (Last 24 hours) at 10/02/15 1130 Last data filed at 10/01/15 1300  Gross per 24 hour  Intake    120 ml  Output      0 ml  Net    120 ml   Filed Weights   09/28/15 1902  Weight: 113.399 kg (250 lb)    Exam:  GENERAL: NAD, clear speech  HEENT: no scleral icterus, PERRL, EOMI, Molar discoloration left lower jaw - no obvious abscess formation or tenderness to palpation.  NECK: supple  LUNGS: good inspiratory effort, CTA biL, no wheezing  HEART: RRR without MRG  ABDOMEN: soft, non tender, nondistended, no gaurding  EXTREMITIES: no clubbing / cyanosis, no peripheral edema  NEUROLOGIC: LUE strength slightly less than RUE, BLE strength 5/5   Data Reviewed: Basic Metabolic Panel:  Recent Labs Lab 09/28/15 1612 10/01/15 1201  NA 143 144  K 3.7 3.8  CL 109 110  CO2 27 26  GLUCOSE 97 119*  BUN 9 8  CREATININE 0.74 0.88  CALCIUM 8.9 9.5   CBC:  Recent Labs Lab 09/28/15 1612  WBC 4.4  NEUTROABS 2.0  HGB 12.2  HCT 36.1  MCV 95.5  PLT 276    Scheduled Meds: . aspirin EC  81 mg Oral Daily    Time spent: 25 minutes    Signature  Lala Lund K M.D on 10/02/2015 at 11:30 AM  Between 7am to 7pm - Pager - [phone], After 7pm go to [email or web address] - password [facility]  [facility]  - Office  [phone]

## 2015-10-03 ENCOUNTER — Encounter (HOSPITAL_COMMUNITY)
Admission: EM | Disposition: A | Discharge status: Rehabilitation Facility | Payer: Self-pay | Source: Home / Self Care | Attending: Internal Medicine

## 2015-10-03 ENCOUNTER — Observation Stay (HOSPITAL_COMMUNITY): Payer: Medicaid Other | Admitting: Anesthesiology

## 2015-10-03 ENCOUNTER — Encounter (HOSPITAL_COMMUNITY): Payer: Self-pay | Admitting: Certified Registered Nurse Anesthetist

## 2015-10-03 ENCOUNTER — Observation Stay (HOSPITAL_COMMUNITY): Payer: Medicaid Other

## 2015-10-03 DIAGNOSIS — K045 Chronic apical periodontitis: Secondary | ICD-10-CM | POA: Diagnosis not present

## 2015-10-03 DIAGNOSIS — M069 Rheumatoid arthritis, unspecified: Secondary | ICD-10-CM | POA: Diagnosis present

## 2015-10-03 DIAGNOSIS — G939 Disorder of brain, unspecified: Secondary | ICD-10-CM | POA: Diagnosis not present

## 2015-10-03 DIAGNOSIS — J45909 Unspecified asthma, uncomplicated: Secondary | ICD-10-CM | POA: Diagnosis not present

## 2015-10-03 DIAGNOSIS — J4520 Mild intermittent asthma, uncomplicated: Secondary | ICD-10-CM | POA: Diagnosis present

## 2015-10-03 DIAGNOSIS — F4323 Adjustment disorder with mixed anxiety and depressed mood: Secondary | ICD-10-CM | POA: Diagnosis not present

## 2015-10-03 DIAGNOSIS — R51 Headache: Secondary | ICD-10-CM | POA: Diagnosis not present

## 2015-10-03 DIAGNOSIS — D62 Acute posthemorrhagic anemia: Secondary | ICD-10-CM | POA: Diagnosis not present

## 2015-10-03 DIAGNOSIS — B9689 Other specified bacterial agents as the cause of diseases classified elsewhere: Secondary | ICD-10-CM | POA: Diagnosis not present

## 2015-10-03 DIAGNOSIS — Z9889 Other specified postprocedural states: Secondary | ICD-10-CM | POA: Diagnosis not present

## 2015-10-03 DIAGNOSIS — K047 Periapical abscess without sinus: Secondary | ICD-10-CM | POA: Diagnosis not present

## 2015-10-03 DIAGNOSIS — G44019 Episodic cluster headache, not intractable: Secondary | ICD-10-CM | POA: Diagnosis not present

## 2015-10-03 DIAGNOSIS — K0889 Other specified disorders of teeth and supporting structures: Secondary | ICD-10-CM | POA: Diagnosis not present

## 2015-10-03 DIAGNOSIS — B954 Other streptococcus as the cause of diseases classified elsewhere: Secondary | ICD-10-CM | POA: Diagnosis present

## 2015-10-03 DIAGNOSIS — Z888 Allergy status to other drugs, medicaments and biological substances status: Secondary | ICD-10-CM | POA: Diagnosis not present

## 2015-10-03 DIAGNOSIS — G458 Other transient cerebral ischemic attacks and related syndromes: Secondary | ICD-10-CM | POA: Diagnosis not present

## 2015-10-03 DIAGNOSIS — W1830XA Fall on same level, unspecified, initial encounter: Secondary | ICD-10-CM | POA: Diagnosis present

## 2015-10-03 DIAGNOSIS — F419 Anxiety disorder, unspecified: Secondary | ICD-10-CM | POA: Diagnosis present

## 2015-10-03 DIAGNOSIS — G8194 Hemiplegia, unspecified affecting left nondominant side: Secondary | ICD-10-CM | POA: Diagnosis not present

## 2015-10-03 DIAGNOSIS — G459 Transient cerebral ischemic attack, unspecified: Secondary | ICD-10-CM | POA: Diagnosis present

## 2015-10-03 DIAGNOSIS — G060 Intracranial abscess and granuloma: Secondary | ICD-10-CM | POA: Diagnosis present

## 2015-10-03 DIAGNOSIS — R1110 Vomiting, unspecified: Secondary | ICD-10-CM | POA: Diagnosis not present

## 2015-10-03 DIAGNOSIS — G936 Cerebral edema: Secondary | ICD-10-CM | POA: Diagnosis present

## 2015-10-03 DIAGNOSIS — Z79899 Other long term (current) drug therapy: Secondary | ICD-10-CM | POA: Diagnosis not present

## 2015-10-03 HISTORY — PX: CRANIOTOMY: SHX93

## 2015-10-03 HISTORY — PX: APPLICATION OF CRANIAL NAVIGATION: SHX6578

## 2015-10-03 LAB — SURGICAL PCR SCREEN
MRSA, PCR: NEGATIVE
Staphylococcus aureus: POSITIVE — AB

## 2015-10-03 LAB — GRAM STAIN

## 2015-10-03 SURGERY — CRANIOTOMY TUMOR EXCISION
Anesthesia: General | Site: Head | Laterality: Right

## 2015-10-03 MED ORDER — PHENYLEPHRINE HCL 10 MG/ML IJ SOLN
INTRAMUSCULAR | Status: DC | PRN
  Administered 2015-10-03 (×2): 80 ug via INTRAVENOUS

## 2015-10-03 MED ORDER — FENTANYL CITRATE (PF) 100 MCG/2ML IJ SOLN
INTRAMUSCULAR | Status: DC | PRN
  Administered 2015-10-03: 50 ug via INTRAVENOUS
  Administered 2015-10-03 (×2): 100 ug via INTRAVENOUS
  Administered 2015-10-03 (×2): 50 ug via INTRAVENOUS

## 2015-10-03 MED ORDER — SENNA 8.6 MG PO TABS
1.0000 | ORAL_TABLET | Freq: Two times a day (BID) | ORAL | Status: DC
  Administered 2015-10-03 – 2015-10-07 (×8): 8.6 mg via ORAL
  Filled 2015-10-03 (×8): qty 1

## 2015-10-03 MED ORDER — HEMOSTATIC AGENTS (NO CHARGE) OPTIME
TOPICAL | Status: DC | PRN
  Administered 2015-10-03: 1 via TOPICAL

## 2015-10-03 MED ORDER — HYDROMORPHONE HCL 1 MG/ML IJ SOLN
INTRAMUSCULAR | Status: AC
  Filled 2015-10-03: qty 1

## 2015-10-03 MED ORDER — PROMETHAZINE HCL 25 MG PO TABS
12.5000 mg | ORAL_TABLET | ORAL | Status: DC | PRN
  Administered 2015-10-05: 25 mg via ORAL
  Filled 2015-10-03: qty 1

## 2015-10-03 MED ORDER — HYDROCODONE-ACETAMINOPHEN 5-325 MG PO TABS
ORAL_TABLET | ORAL | Status: AC
  Filled 2015-10-03: qty 1

## 2015-10-03 MED ORDER — LORAZEPAM 2 MG/ML IJ SOLN
1.0000 mg | Freq: Once | INTRAMUSCULAR | Status: AC
  Administered 2015-10-03: 1 mg via INTRAVENOUS

## 2015-10-03 MED ORDER — MIDAZOLAM HCL 5 MG/5ML IJ SOLN
INTRAMUSCULAR | Status: DC | PRN
  Administered 2015-10-03: 2 mg via INTRAVENOUS

## 2015-10-03 MED ORDER — DEXTROSE 5 % IV SOLN
2.0000 g | Freq: Three times a day (TID) | INTRAVENOUS | Status: DC
  Administered 2015-10-04 – 2015-10-06 (×7): 2 g via INTRAVENOUS
  Filled 2015-10-03 (×11): qty 2

## 2015-10-03 MED ORDER — LIDOCAINE HCL (CARDIAC) 20 MG/ML IV SOLN
INTRAVENOUS | Status: DC | PRN
  Administered 2015-10-03: 80 mg via INTRAVENOUS

## 2015-10-03 MED ORDER — SODIUM CHLORIDE 0.9 % IJ SOLN
INTRAMUSCULAR | Status: AC
  Filled 2015-10-03: qty 10

## 2015-10-03 MED ORDER — PROPOFOL 10 MG/ML IV BOLUS
INTRAVENOUS | Status: DC | PRN
  Administered 2015-10-03: 200 mg via INTRAVENOUS

## 2015-10-03 MED ORDER — ONDANSETRON HCL 4 MG/2ML IJ SOLN
INTRAMUSCULAR | Status: DC | PRN
  Administered 2015-10-03: 4 mg via INTRAVENOUS

## 2015-10-03 MED ORDER — SUGAMMADEX SODIUM 500 MG/5ML IV SOLN
INTRAVENOUS | Status: AC
  Filled 2015-10-03: qty 5

## 2015-10-03 MED ORDER — MORPHINE SULFATE (PF) 2 MG/ML IV SOLN
1.0000 mg | INTRAVENOUS | Status: DC | PRN
  Administered 2015-10-03 – 2015-10-04 (×2): 2 mg via INTRAVENOUS
  Filled 2015-10-03 (×2): qty 1

## 2015-10-03 MED ORDER — THROMBIN 5000 UNITS EX SOLR
OROMUCOSAL | Status: DC | PRN
  Administered 2015-10-03: 18:00:00 via TOPICAL

## 2015-10-03 MED ORDER — METRONIDAZOLE IVPB CUSTOM
1.0000 g | Freq: Once | INTRAVENOUS | Status: AC
  Administered 2015-10-03: 1 g via INTRAVENOUS
  Filled 2015-10-03: qty 200

## 2015-10-03 MED ORDER — ACETAMINOPHEN 650 MG RE SUPP: 650.0000 mg | RECTAL | Status: DC | PRN

## 2015-10-03 MED ORDER — VANCOMYCIN HCL 10 G IV SOLR
2000.0000 mg | Freq: Once | INTRAVENOUS | Status: AC
  Administered 2015-10-04: 2000 mg via INTRAVENOUS
  Filled 2015-10-03: qty 2000

## 2015-10-03 MED ORDER — BISACODYL 10 MG RE SUPP: 10.0000 mg | Freq: Every day | RECTAL | Status: DC | PRN

## 2015-10-03 MED ORDER — DOCUSATE SODIUM 100 MG PO CAPS
100.0000 mg | ORAL_CAPSULE | Freq: Two times a day (BID) | ORAL | Status: DC
  Administered 2015-10-03 – 2015-10-04 (×3): 100 mg via ORAL
  Filled 2015-10-03 (×3): qty 1

## 2015-10-03 MED ORDER — ROCURONIUM BROMIDE 50 MG/5ML IV SOLN
INTRAVENOUS | Status: AC
  Filled 2015-10-03: qty 1

## 2015-10-03 MED ORDER — ARTIFICIAL TEARS OP OINT
TOPICAL_OINTMENT | OPHTHALMIC | Status: AC
  Filled 2015-10-03: qty 3.5

## 2015-10-03 MED ORDER — ACETAMINOPHEN 325 MG PO TABS: 650.0000 mg | ORAL_TABLET | ORAL | Status: DC | PRN

## 2015-10-03 MED ORDER — METRONIDAZOLE IN NACL 5-0.79 MG/ML-% IV SOLN
500.0000 mg | Freq: Three times a day (TID) | INTRAVENOUS | Status: DC
  Filled 2015-10-03: qty 100

## 2015-10-03 MED ORDER — LABETALOL HCL 5 MG/ML IV SOLN: 10.0000 mg | INTRAVENOUS | Status: DC | PRN

## 2015-10-03 MED ORDER — PROMETHAZINE HCL 25 MG/ML IJ SOLN
6.2500 mg | INTRAMUSCULAR | Status: DC | PRN
  Administered 2015-10-03: 6.25 mg via INTRAVENOUS

## 2015-10-03 MED ORDER — SODIUM CHLORIDE 0.9 % IV SOLN
INTRAVENOUS | Status: DC | PRN
  Administered 2015-10-03 (×2): via INTRAVENOUS

## 2015-10-03 MED ORDER — BACITRACIN ZINC 500 UNIT/GM EX OINT
TOPICAL_OINTMENT | CUTANEOUS | Status: DC | PRN
  Administered 2015-10-03 (×2): 1 via TOPICAL

## 2015-10-03 MED ORDER — HYDROMORPHONE HCL 1 MG/ML IJ SOLN
0.2500 mg | INTRAMUSCULAR | Status: DC | PRN
  Administered 2015-10-03 (×4): 0.5 mg via INTRAVENOUS

## 2015-10-03 MED ORDER — 0.9 % SODIUM CHLORIDE (POUR BTL) OPTIME
TOPICAL | Status: DC | PRN
  Administered 2015-10-03 (×2): 1000 mL

## 2015-10-03 MED ORDER — PHENYLEPHRINE 40 MCG/ML (10ML) SYRINGE FOR IV PUSH (FOR BLOOD PRESSURE SUPPORT)
PREFILLED_SYRINGE | INTRAVENOUS | Status: AC
  Filled 2015-10-03: qty 10

## 2015-10-03 MED ORDER — VANCOMYCIN HCL IN DEXTROSE 1-5 GM/200ML-% IV SOLN
1000.0000 mg | Freq: Three times a day (TID) | INTRAVENOUS | Status: DC
  Administered 2015-10-04 (×2): 1000 mg via INTRAVENOUS
  Filled 2015-10-03 (×3): qty 200

## 2015-10-03 MED ORDER — METRONIDAZOLE IN NACL 5-0.79 MG/ML-% IV SOLN
500.0000 mg | Freq: Four times a day (QID) | INTRAVENOUS | Status: DC
  Administered 2015-10-04 – 2015-10-06 (×11): 500 mg via INTRAVENOUS
  Filled 2015-10-03 (×15): qty 100

## 2015-10-03 MED ORDER — PANTOPRAZOLE SODIUM 40 MG IV SOLR
40.0000 mg | Freq: Every day | INTRAVENOUS | Status: DC
  Administered 2015-10-03 – 2015-10-04 (×2): 40 mg via INTRAVENOUS
  Filled 2015-10-03 (×2): qty 40

## 2015-10-03 MED ORDER — PROPOFOL 10 MG/ML IV BOLUS
INTRAVENOUS | Status: AC
  Filled 2015-10-03: qty 20

## 2015-10-03 MED ORDER — LIDOCAINE-EPINEPHRINE 1 %-1:100000 IJ SOLN
INTRAMUSCULAR | Status: DC | PRN
  Administered 2015-10-03: 5 mL

## 2015-10-03 MED ORDER — DEXAMETHASONE SODIUM PHOSPHATE 4 MG/ML IJ SOLN
INTRAMUSCULAR | Status: DC | PRN
  Administered 2015-10-03: 10 mg via INTRAVENOUS

## 2015-10-03 MED ORDER — SUGAMMADEX SODIUM 200 MG/2ML IV SOLN
INTRAVENOUS | Status: DC | PRN
  Administered 2015-10-03: 500 mg via INTRAVENOUS

## 2015-10-03 MED ORDER — ROCURONIUM BROMIDE 100 MG/10ML IV SOLN
INTRAVENOUS | Status: DC | PRN
  Administered 2015-10-03 (×2): 50 mg via INTRAVENOUS

## 2015-10-03 MED ORDER — CEFAZOLIN SODIUM-DEXTROSE 2-3 GM-% IV SOLR
INTRAVENOUS | Status: AC
  Filled 2015-10-03: qty 100

## 2015-10-03 MED ORDER — CEFAZOLIN SODIUM-DEXTROSE 2-3 GM-% IV SOLR
INTRAVENOUS | Status: AC
  Administered 2015-10-03: 2 g via INTRAVENOUS
  Filled 2015-10-03: qty 50

## 2015-10-03 MED ORDER — ONDANSETRON HCL 4 MG/2ML IJ SOLN
INTRAMUSCULAR | Status: AC
  Filled 2015-10-03: qty 2

## 2015-10-03 MED ORDER — PROMETHAZINE HCL 25 MG/ML IJ SOLN
INTRAMUSCULAR | Status: AC
  Filled 2015-10-03: qty 1

## 2015-10-03 MED ORDER — LEVETIRACETAM 500 MG/5ML IV SOLN
500.0000 mg | Freq: Two times a day (BID) | INTRAVENOUS | Status: DC
  Administered 2015-10-03 – 2015-10-04 (×3): 500 mg via INTRAVENOUS
  Filled 2015-10-03 (×5): qty 5

## 2015-10-03 MED ORDER — GADOBENATE DIMEGLUMINE 529 MG/ML IV SOLN
20.0000 mL | Freq: Once | INTRAVENOUS | Status: AC | PRN
  Administered 2015-10-03: 20 mL via INTRAVENOUS

## 2015-10-03 MED ORDER — BUPIVACAINE HCL (PF) 0.5 % IJ SOLN
INTRAMUSCULAR | Status: DC | PRN
  Administered 2015-10-03: 5 mL

## 2015-10-03 MED ORDER — SODIUM CHLORIDE 0.9 % IV SOLN
INTRAVENOUS | Status: DC
  Administered 2015-10-03 – 2015-10-05 (×3): via INTRAVENOUS

## 2015-10-03 MED ORDER — HYDROCODONE-ACETAMINOPHEN 5-325 MG PO TABS
1.0000 | ORAL_TABLET | ORAL | Status: DC | PRN
  Administered 2015-10-03: 1 via ORAL

## 2015-10-03 MED ORDER — THROMBIN 20000 UNITS EX SOLR
CUTANEOUS | Status: DC | PRN
  Administered 2015-10-03: 18:00:00 via TOPICAL

## 2015-10-03 MED ORDER — THROMBIN 5000 UNITS EX SOLR
CUTANEOUS | Status: DC | PRN
  Administered 2015-10-03: 5000 [IU] via TOPICAL

## 2015-10-03 MED ORDER — SODIUM CHLORIDE 0.9 % IR SOLN
Status: DC | PRN
  Administered 2015-10-03: 18:00:00

## 2015-10-03 MED ORDER — FENTANYL CITRATE (PF) 250 MCG/5ML IJ SOLN
INTRAMUSCULAR | Status: AC
  Filled 2015-10-03: qty 5

## 2015-10-03 MED ORDER — MIDAZOLAM HCL 2 MG/2ML IJ SOLN
INTRAMUSCULAR | Status: AC
  Filled 2015-10-03: qty 2

## 2015-10-03 MED ORDER — LIDOCAINE HCL (CARDIAC) 20 MG/ML IV SOLN
INTRAVENOUS | Status: AC
  Filled 2015-10-03: qty 5

## 2015-10-03 SURGICAL SUPPLY — 70 items
BAG DECANTER FOR FLEXI CONT (MISCELLANEOUS) ×3 IMPLANT
BANDAGE ADH SHEER 1  50/CT (GAUZE/BANDAGES/DRESSINGS) IMPLANT
BATTERY IQ STERILE (MISCELLANEOUS) ×3 IMPLANT
BLADE CLIPPER SURG (BLADE) IMPLANT
BTRY SRG DRVR 1.5 IQ (MISCELLANEOUS) ×2
BUR ACORN 6.0 PRECISION (BURR) ×2 IMPLANT
BUR ACORN 6.0MM PRECISION (BURR) ×1
BUR SPIRAL ROUTER 2.3 (BUR) ×2 IMPLANT
BUR SPIRAL ROUTER 2.3MM (BUR) ×1
CANISTER SUCT 3000ML PPV (MISCELLANEOUS) ×7 IMPLANT
CONT SPEC 4OZ CLIKSEAL STRL BL (MISCELLANEOUS) ×3 IMPLANT
DECANTER SPIKE VIAL GLASS SM (MISCELLANEOUS) ×3 IMPLANT
DRAPE NEUROLOGICAL W/INCISE (DRAPES) ×3 IMPLANT
DRAPE POUCH INSTRU U-SHP 10X18 (DRAPES) ×4 IMPLANT
DRAPE PROXIMA HALF (DRAPES) ×3 IMPLANT
DRAPE WARM FLUID 44X44 (DRAPE) ×3 IMPLANT
DRSG TELFA 3X8 NADH (GAUZE/BANDAGES/DRESSINGS) ×4 IMPLANT
DURAPREP 26ML APPLICATOR (WOUND CARE) ×3 IMPLANT
ELECT REM PT RETURN 9FT ADLT (ELECTROSURGICAL) ×4
ELECTRODE REM PT RTRN 9FT ADLT (ELECTROSURGICAL) ×2 IMPLANT
FORCEPS BIPOLAR SPETZLER 8 1.0 (NEUROSURGERY SUPPLIES) ×3 IMPLANT
GAUZE SPONGE 4X4 16PLY XRAY LF (GAUZE/BANDAGES/DRESSINGS) ×1 IMPLANT
GLOVE BIO SURGEON STRL SZ 6.5 (GLOVE) ×6 IMPLANT
GLOVE BIO SURGEONS STRL SZ 6.5 (GLOVE) ×3
GLOVE BIOGEL M 8.0 STRL (GLOVE) ×3 IMPLANT
GLOVE BIOGEL PI IND STRL 6.5 (GLOVE) ×1 IMPLANT
GLOVE BIOGEL PI IND STRL 7.5 (GLOVE) ×2 IMPLANT
GLOVE BIOGEL PI INDICATOR 6.5 (GLOVE) ×2
GLOVE BIOGEL PI INDICATOR 7.5 (GLOVE) ×2
GLOVE ECLIPSE 7.0 STRL STRAW (GLOVE) ×7 IMPLANT
GLOVE EXAM NITRILE LRG STRL (GLOVE) IMPLANT
GLOVE EXAM NITRILE MD LF STRL (GLOVE) IMPLANT
GLOVE EXAM NITRILE XL STR (GLOVE) IMPLANT
GLOVE EXAM NITRILE XS STR PU (GLOVE) IMPLANT
GOWN STRL REUS W/ TWL LRG LVL3 (GOWN DISPOSABLE) ×2 IMPLANT
GOWN STRL REUS W/ TWL XL LVL3 (GOWN DISPOSABLE) ×1 IMPLANT
GOWN STRL REUS W/TWL 2XL LVL3 (GOWN DISPOSABLE) IMPLANT
GOWN STRL REUS W/TWL LRG LVL3 (GOWN DISPOSABLE) ×8
GOWN STRL REUS W/TWL XL LVL3 (GOWN DISPOSABLE) ×4
HEMOSTAT POWDER KIT SURGIFOAM (HEMOSTASIS) ×3 IMPLANT
IV NS 1000ML (IV SOLUTION) ×4
IV NS 1000ML BAXH (IV SOLUTION) ×1 IMPLANT
KIT BASIN OR (CUSTOM PROCEDURE TRAY) ×4 IMPLANT
KIT NDL BIOPSY 1.8X235 CRAN (NEEDLE) ×1 IMPLANT
KIT NEEDLE BIOPSY 1.8X235 CRAN (NEEDLE) IMPLANT
KIT ROOM TURNOVER OR (KITS) ×4 IMPLANT
NDL HYPO 25X1 1.5 SAFETY (NEEDLE) ×1 IMPLANT
NEEDLE HYPO 25X1 1.5 SAFETY (NEEDLE) ×4 IMPLANT
NS IRRIG 1000ML POUR BTL (IV SOLUTION) ×7 IMPLANT
PACK CRANIOTOMY (CUSTOM PROCEDURE TRAY) ×4 IMPLANT
PAD ARMBOARD 7.5X6 YLW CONV (MISCELLANEOUS) ×12 IMPLANT
PAD DRESSING TELFA 3X8 NADH (GAUZE/BANDAGES/DRESSINGS) ×1 IMPLANT
PERFORATOR LRG  14-11MM (BIT)
PERFORATOR LRG 14-11MM (BIT) ×1 IMPLANT
PLATE 1.5/0.5 13MM BURR HOLE (Plate) ×9 IMPLANT
SCREW SELF DRILL HT 1.5/4MM (Screw) ×27 IMPLANT
SET TUBING W/EXT DISP (INSTRUMENTS) ×3 IMPLANT
SPONGE SURGIFOAM ABS GEL 100 (HEMOSTASIS) ×3 IMPLANT
SPONGE SURGIFOAM ABS GEL SZ50 (HEMOSTASIS) ×3 IMPLANT
STAPLER SKIN PROX WIDE 3.9 (STAPLE) ×4 IMPLANT
STAPLER VISISTAT 35W (STAPLE) ×3 IMPLANT
SUT NURALON 4 0 TR CR/8 (SUTURE) ×3 IMPLANT
SUT VIC AB 3-0 SH 8-18 (SUTURE) ×7 IMPLANT
SWAB CULTURE LIQ STUART DBL (MISCELLANEOUS) ×6 IMPLANT
SYR 5ML LUER SLIP (SYRINGE) ×2 IMPLANT
TIP STRAIGHT 25KHZ (INSTRUMENTS) ×3 IMPLANT
TOWEL OR 17X24 6PK STRL BLUE (TOWEL DISPOSABLE) ×4 IMPLANT
TOWEL OR 17X26 10 PK STRL BLUE (TOWEL DISPOSABLE) ×4 IMPLANT
TRAY FOLEY CATH 14FRSI W/METER (CATHETERS) ×3 IMPLANT
WATER STERILE IRR 1000ML POUR (IV SOLUTION) ×4 IMPLANT

## 2015-10-03 NOTE — Anesthesia Preprocedure Evaluation (Addendum)
Anesthesia Evaluation  Patient identified by MRN, date of birth, ID band Patient awake    Reviewed: Allergy & Precautions, Patient's Chart, lab work & pertinent test results  History of Anesthesia Complications Negative for: history of anesthetic complications  Airway Mallampati: II  TM Distance: >3 FB Neck ROM: Full    Dental  (+) Teeth Intact, Dental Advisory Given   Pulmonary asthma ,    Pulmonary exam normal        Cardiovascular negative cardio ROS Normal cardiovascular exam     Neuro/Psych PSYCHIATRIC DISORDERS Depression    GI/Hepatic negative GI ROS, Neg liver ROS,   Endo/Other  Morbid obesity  Renal/GU negative Renal ROS     Musculoskeletal   Abdominal   Peds  Hematology   Anesthesia Other Findings   Reproductive/Obstetrics                            Anesthesia Physical Anesthesia Plan  ASA: III  Anesthesia Plan: General   Post-op Pain Management:    Induction: Intravenous  Airway Management Planned: Oral ETT  Additional Equipment:   Intra-op Plan:   Post-operative Plan: Extubation in OR  Informed Consent: I have reviewed the patients History and Physical, chart, labs and discussed the procedure including the risks, benefits and alternatives for the proposed anesthesia with the patient or authorized representative who has indicated his/her understanding and acceptance.   Dental advisory given  Plan Discussed with: CRNA and Anesthesiologist  Anesthesia Plan Comments:         Anesthesia Quick Evaluation

## 2015-10-03 NOTE — Anesthesia Postprocedure Evaluation (Signed)
Anesthesia Post Note  Patient: [name]  Procedure(s) Performed: Procedure(s) (LRB): Stereotactic right frontoparietal craniotomy for excisional biopsy of lesion  (Right) APPLICATION OF CRANIAL NAVIGATION (Right)  Patient location during evaluation: PACU Anesthesia Type: General Level of consciousness: awake and alert, awake, patient cooperative and oriented Pain management: pain level controlled Vital Signs Assessment: post-procedure vital signs reviewed and stable Respiratory status: spontaneous breathing, nonlabored ventilation, respiratory function stable and patient connected to nasal cannula oxygen Cardiovascular status: blood pressure returned to baseline and stable Postop Assessment: no signs of nausea or vomiting Anesthetic complications: no Comments: Patient awake and doing well in PACU.  Will be transferred to Neuro ICU once bed available.    Last Vitals:  Filed Vitals:   10/03/15 2012 10/03/15 2030  BP: 150/81 151/80  Pulse: 74 79  Temp:    Resp: 11 9    Last Pain:  Filed Vitals:   10/03/15 2035  PainSc: [place]

## 2015-10-03 NOTE — Progress Notes (Signed)
No issues overnight. Pt has mild HA currently, some left arm/leg dysesthesia and minimal weakness.  EXAM:  BP 141/77 mmHg  Pulse 86  Temp(Src) 97.7 F (36.5 C) (Oral)  Resp 18  Ht 5\' 9"  (1.753 m)  Wt 113.399 kg (250 lb)  BMI 36.90 kg/m2  SpO2 96%  LMP 09/27/2011  Awake, alert, oriented  Speech fluent, appropriate  CN grossly intact  5/5 RUE/RLE Good strength LUE, slight drift 4+/5 proximal LLE, good strength distal  IMPRESSION:  50 y.o. 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  PLAN: - Will plan on stereotactic excisional biopsy today - MRI brain with stereotactic protocol prior to surgery  I have reviewed the imaging findings with the patient. I did recommend excisional biopsy for diagnosis. Risks were reviewed including weakness/paralyisis, numbness, seizure, hydrocephalus, bleeding, and infection. All her questions were answered and she provided consent to proceed.

## 2015-10-03 NOTE — Progress Notes (Signed)
Patient taken to OR for craniotomy, will be transferred to Neuro ICU after surgery.

## 2015-10-03 NOTE — Progress Notes (Signed)
PROGRESS NOTE  Jase Demo [id number] DOB: September 01, 1966 DOA: 09/28/2015 PCP: Harle Battiest, MD  HPI:  Avaia Niskanen is a 50 y.o. femalewithout significant medical history, who presented to the emergency room with a chief complaint of headache on and off x4 days, and new onset left arm and left leg numbness and weakness.  Subjective / 24 H Interval events  Patient remains in bed, denies any fever or chills, headaches improved, no focal weakness, no chest or abdominal pain, minimal numbness in the left upper extremity improved.  Assessment/Plan:  Headaches caused by Newly noted 1cm ring enhancing lesion on MRI  Likely infectious in etiology, she does have rotten tooth in the left lower jaw, HIV negative, toxoplasmosis negative, pending cysticercosis workup. Neurology, neurosurgery and ID following. Blood cultures negative echo stable.  Bedside LP failed, further workup as directed by neurology and neurosurgery. For now brain biopsy pending for 4 PM on 10/03/2015, continue aspirin, no role for Decadron per neurology and neurosurgery. Supportive care for headache.   Palpitations, suspect related to anxiety, now resolved, normal sinus rhythm on EKG. Echo with EF 65-70%, grade 1 chronic and compensated diastolic dysfunction.   Intermittent Asthma  well controlled, no dyspnea or wheezing   Left lower jaw chronic tooth infection. We'll set her up with dental surgery postdischarge.   Diet: Regular  Fluids: None DVT Prophylaxis: SCD  Code Status: Full Code Family Communication: None at bedside. Discussed with patient's sister over the phone.  Disposition Plan: Likely discharge to home next day of brain biopsy if stable.   Consultants:  Neurology - Dr. Nicole Kindred  Infectious Disease - Dr. Baxter Flattery  Neurosurgery - Dr. Joya Salm  Procedures:  2D echo Study Conclusions - Left ventricle: The cavity size was normal. There was moderateconcentric hypertrophy. Systolic function was vigorous.  The estimated ejection fraction was in the range of 65% to 70%. Wallmotion was normal; there were no regional wall motionabnormalities. Doppler parameters are consistent with abnormalleft ventricular relaxation (grade 1 diastolic dysfunction).There was no evidence of elevated ventricular filling pressure byDoppler parameters. - Aortic valve: There was no regurgitation. - Aortic root: The aortic root was normal in size. - Left atrium: The atrium was normal in size. - Right ventricle: Systolic function was normal. - Tricuspid valve: There was trivial regurgitation. - Pulmonic valve: There was no regurgitation. - Pulmonary arteries: Systolic pressure was within the normalrange. - Inferior vena cava: The vessel was normal in size. - Pericardium, extracardiac: There was no pericardial effusion.   Antibiotics None   Studies  Dg Orthopantogram  10/01/2015  CLINICAL DATA:  Tooth abscess. EXAM: ORTHOPANTOGRAM/PANORAMIC COMPARISON:  None. FINDINGS: Exam demonstrates the crown of a tooth over the left lower most posterior molar region with possible dental caries and without any associated roots. Possible subtle lucency adjacent the root of the left upper most posterior molar tooth. Remaining bones and soft tissues are unremarkable. IMPRESSION: Possible subtle lucency adjacent the root of the left most posterior upper molar tooth suggesting mild periodontal disease. Possible crown of tooth with dental carie over the most posterior left lower molar region without any associated roots. Recommend clinical correlation. Electronically Signed   By: Marin Olp M.D.   On: 10/01/2015 16:08    Objective  Filed Vitals:   10/02/15 1655 10/02/15 2134 10/03/15 0114 10/03/15 0523  BP: 113/70 131/50 124/60 141/77  Pulse: 85 83 70 86  Temp: 97.8 F (36.6 C) 98.2 F (36.8 C) 97.6 F (36.4 C) 97.7 F (36.5 C)  TempSrc: Oral Oral  Oral Oral  Resp: 18 18 18 18   Height:      Weight:      SpO2: 96% 97%  95% 96%   No intake or output data in the 24 hours ending 10/03/15 1011 Filed Weights   09/28/15 1902  Weight: 113.399 kg (250 lb)    Exam:   GENERAL: NAD, clear speech  HEENT: no scleral icterus, PERRL, EOMI, Molar discoloration left lower jaw - no obvious abscess formation or tenderness to palpation.  NECK: supple  LUNGS: good inspiratory effort, CTA biL, no wheezing  HEART: RRR without MRG  ABDOMEN: soft, non tender, nondistended, no gaurding  EXTREMITIES: no clubbing / cyanosis, no peripheral edema  NEUROLOGIC: LUE strength slightly less than RUE, BLE strength 5/5   Data Reviewed: Basic Metabolic Panel:  Recent Labs Lab 09/28/15 1612 10/01/15 1201  NA 143 144  K 3.7 3.8  CL 109 110  CO2 27 26  GLUCOSE 97 119*  BUN 9 8  CREATININE 0.74 0.88  CALCIUM 8.9 9.5   CBC:  Recent Labs Lab 09/28/15 1612  WBC 4.4  NEUTROABS 2.0  HGB 12.2  HCT 36.1  MCV 95.5  PLT 276    Scheduled Meds: . aspirin EC  81 mg Oral Daily    Time spent: 25 minutes    Signature  Lala Lund K M.D on 10/03/2015 at 10:11 AM  Between 7am to 7pm - Pager - [phone], After 7pm go to [email or web address] - password [facility]  [facility]  - Office  [phone]

## 2015-10-03 NOTE — Progress Notes (Signed)
Microbiology called to report the following results: Wound showing moderate WBC's. Abundant poly and mono gram negative rods with a few gram positive cocci and pairs. Results called to new RN taking care of this patient. Amairani Shuey, Rande Brunt, RN

## 2015-10-03 NOTE — Op Note (Addendum)
PREOP DIAGNOSIS:  1. Right parietal lesion   POSTOP DIAGNOSIS:  1. Right parietal abscess  PROCEDURE: 1. Stereotactic right frontoparietal craniotomy for resection of lesion  SURGEON: Dr. Consuella Lose, MD  ASSISTANT: Dr. Leeroy Cha, MD  ANESTHESIA: General Endotracheal  EBL: 150cc  SPECIMENS:  1. Right parietal lesion for permanent pathology, tissue culture 2. Right parietal swabs for aerobic/anaerobic culture  DRAINS: None  COMPLICATIONS: None immediate  CONDITION: Stable to PACU  HISTORY: [name] is a 50 y.o. female who initially presented to the emergency department with sudden onset of left-sided paresthesias and mild weakness. CT and MRI were done which demonstrated a ring-enhancing right postcentral lesion suspicious for abscess. She does have a history of tooth infection. She was admitted to the hospital overnight, and with a lack of diagnosis, surgical biopsy/resection was indicated. The risks and benefits of the surgery were splint in detail to the patient and her sister. After all questions were answered, informed consent was obtained and witnessed.  PROCEDURE IN DETAIL: After informed consent was obtained and witnessed, the patient was brought to the operating room. After induction of general anesthesia, the patient was positioned on the operative table in the [place] head holder in the supine position. All pressure points were meticulously padded. Utilizing the preoperative stereotactic MRI scan, surface markers were co-registered until satisfactory accuracy was achieved. The stereotactic system was then used to plan out a curvilinear skin incision which would allow access to the underlying tumor. Skin incision was then marked out and prepped and draped in the usual sterile fashion.  After timeout was conducted, skin incision was infiltrated with local anesthetic with epinephrine. Incision was then made sharply, and carried down through the galea.  Self-retaining retractors were then placed. The Ceretec system was again used to delineate the underlying superior sagittal sinus, and a craniotomy was planned to access the tumor. Multiple bur holes were then created and connected with the craniotome. Bone flap was then elevated, and hemostasis was secured on the epidural surface. The dura was then incised in a stellate fashion, and leaflets were tacked up with 4-0 Nurolon. We did identify an area of hyperemic brain, with what appeared to be lesion coming to the surface more medially. The stereotactic system was used to confirm location of the tumor at this location. At this point, using a combination of the ultrasonic aspirator, and bipolar electrocautery, the pia was incised initially, and the lesion was then dissected circumferentially. We did enter the lesion, and encountered frank pus, which was cultured with swabs. Once the lesion was dissected, it was sent for tissue culture and permanent pathology. At this point, having completed resection, the wound is irrigated with copious amounts of normal saline irrigation. Hemostasis was achieved using a combination of bipolar electrocautery and morcellized Gelfoam with thrombin. Dural leaflets were then reapproximated with 4-0 Nurolon stitches. A layer of Gelfoam was placed on the epidural surface, and the bone flap was replaced using standard titanium plates and screws. The galea was then approximated with 3-0 Vicryl stitches, and the skin was closed with staples. At the end of the case all sponge, needle, instrument, and cottonoid counts were correct. Sterile dressing was applied after the [place] head holder was removed. The patient was then debated, and taken to the postanesthesia care unit in stable hemodynamic condition.

## 2015-10-03 NOTE — Anesthesia Procedure Notes (Signed)
Procedure Name: Intubation Date/Time: 10/03/2015 4:55 PM Performed by: Ollen Bowl Pre-anesthesia Checklist: Patient identified, Emergency Drugs available, Suction available, Patient being monitored and Timeout performed Patient Re-evaluated:Patient Re-evaluated prior to inductionOxygen Delivery Method: Circle system utilized and Simple face mask Preoxygenation: Pre-oxygenation with 100% oxygen Intubation Type: IV induction Ventilation: Mask ventilation without difficulty Laryngoscope Size: Mac and 4 Grade View: Grade I Tube type: Oral Tube size: 7.5 mm Number of attempts: 1 Airway Equipment and Method: Patient positioned with wedge pillow and Stylet Placement Confirmation: ETT inserted through vocal cords under direct vision,  positive ETCO2 and breath sounds checked- equal and bilateral Secured at: 22 cm Tube secured with: Tape

## 2015-10-03 NOTE — Progress Notes (Deleted)
Patient refused wound care for night shift staff and is requesting the wound care nurse to change his dressings, he is also refusing feeding supplements and some medications.

## 2015-10-03 NOTE — Progress Notes (Signed)
ANTIBIOTIC CONSULT NOTE - INITIAL  Pharmacy Consult for cefepime Indication: brain abscess  Allergies  Allergen Reactions  . Diclofenac Sodium Itching  . Meloxicam Itching    Patient Measurements: Height: 5\' 9"  (175.3 cm) Weight: 250 lb (113.399 kg) IBW/kg (Calculated) : 66.2   Vital Signs: Temp: 98.3 F (36.8 C) (01/16 1332) Temp Source: Oral (01/16 1332) BP: 116/54 mmHg (01/16 1332) Pulse Rate: 69 (01/16 1332) Intake/Output from previous day:   Intake/Output from this shift: Total I/O In: 700 [I.V.:700] Out: 60 [Urine:60]  Labs:  Recent Labs  10/01/15 1201  CREATININE 0.88   Estimated Creatinine Clearance: 106.2 mL/min (by C-G formula based on Cr of 0.88). No results for input(s): VANCOTROUGH, VANCOPEAK, VANCORANDOM, GENTTROUGH, GENTPEAK, GENTRANDOM, TOBRATROUGH, TOBRAPEAK, TOBRARND, AMIKACINPEAK, AMIKACINTROU, AMIKACIN in the last 72 hours.   Microbiology: Recent Results (from the past 720 hour(s))  Culture, blood (routine x 2)     Status: None (Preliminary result)   Collection Time: 09/29/15 10:30 PM  Result Value Ref Range Status   Specimen Description BLOOD RIGHT HAND  Final   Special Requests BOTTLES DRAWN AEROBIC AND ANAEROBIC 6CC  Final   Culture   Final    NO GROWTH 3 DAYS Performed at [facility]    Report Status PENDING  Incomplete  Culture, blood (routine x 2)     Status: None (Preliminary result)   Collection Time: 09/29/15 10:38 PM  Result Value Ref Range Status   Specimen Description BLOOD RIGHT ARM  Final   Special Requests BOTTLES DRAWN AEROBIC AND ANAEROBIC 6CC  Final   Culture   Final    NO GROWTH 3 DAYS Performed at [facility]    Report Status PENDING  Incomplete  Surgical pcr screen     Status: Abnormal   Collection Time: 10/03/15  1:22 PM  Result Value Ref Range Status   MRSA, PCR NEGATIVE NEGATIVE Final   Staphylococcus aureus POSITIVE (A) NEGATIVE Final    Comment:        The Xpert SA Assay (FDA approved  for NASAL specimens in patients over 40 years of age), is one component of a comprehensive surveillance program.  Test performance has been validated by [facility] for patients greater than or equal to 28 year old. It is not intended to diagnose infection nor to guide or monitor treatment.     Medical History: Past Medical History  Diagnosis Date  . Asthma   . Rotator cuff tear   . Perimenopausal   . Slipped intervertebral disc   . Meniscus tear   . Chronic knee pain     right  . Rheumatoid arthritis ([place])     Medications:  Prescriptions prior to admission  Medication Sig Dispense Refill Last Dose  . albuterol (PROVENTIL HFA;VENTOLIN HFA) 108 (90 BASE) MCG/ACT inhaler Inhale 2 puffs into the lungs every 6 (six) hours as needed for wheezing or shortness of breath. For wheezing   09/27/2015 at Unknown time  . cholecalciferol (VITAMIN D) 1000 UNITS tablet Take 1,000 Units by mouth daily with breakfast.   09/27/2015 at Unknown time  . cyclobenzaprine (FLEXERIL) 10 MG tablet Take 1 tablet (10 mg total) by mouth 2 (two) times daily as needed for muscle spasms. 20 tablet 0 unknown  . erythromycin ophthalmic ointment Place 1 application into the right eye daily.   09/27/2015 at Unknown time  . methocarbamol (ROBAXIN) 500 MG tablet Take 1 tablet (500 mg total) by mouth 2 (two) times daily. 20 tablet 0 unknown  .  Multiple Vitamin (MULTIVITAMIN WITH MINERALS) TABS tablet Take 1 tablet by mouth daily.   Past Week at Unknown time  . naproxen (NAPROSYN) 500 MG tablet Take 1 tablet (500 mg total) by mouth 2 (two) times daily. 30 tablet 0 unknown  . naproxen sodium (ANAPROX) 220 MG tablet Take 440 mg by mouth 2 (two) times daily as needed (pain).   09/27/2015 at Unknown time  . oxyCODONE-acetaminophen (PERCOCET) 5-325 MG per tablet Take 1-2 tablets by mouth every 4 (four) hours as needed for severe pain. 20 tablet 0 unknown  . sodium chloride (MURO 128) 2 % ophthalmic solution Place 1 drop into  the right eye 2 (two) times daily.   09/27/2015 at Unknown time  . sodium chloride (MURO 128) 5 % ophthalmic ointment Place 1 application into the right eye at bedtime.   09/27/2015 at Unknown time  . traMADol (ULTRAM) 50 MG tablet Take 1 tablet (50 mg total) by mouth every 6 (six) hours as needed. 15 tablet 0 09/28/2015 at Unknown time   Scheduled:  . aspirin EC  81 mg Oral Daily  . docusate sodium  100 mg Oral BID  . HYDROmorphone      . levETIRAcetam  500 mg Intravenous Q12H  . metronidazole  1 g Intravenous Once  . [START ON 10/04/2015] metronidazole  500 mg Intravenous Q8H  . pantoprazole (PROTONIX) IV  40 mg Intravenous QHS  . promethazine      . senna  1 tablet Oral BID    Assessment: 50 yo female here with headache new onset left arm and left leg numbness and weakness. She is s/p brain noted s/p brain biopsy with a right parietal abscess to begin cefepime and vancomycin.  -SCr= 0.88, CrCl > 100  1/16 cefepime 1/16 vanc 1/16 flagyl  1/16 tissue cx 1/16 wound 1/12 blood x2- ngtd  Goal of Therapy:  Vancomycin trough level 15-20 mcg/ml  Plan -Cefepime 2gm IV q8h -Vancomycin 2000mg  followed by 1000mg  IV q8h -Will follow renal function, cultures and clinical progress  Hildred Laser, Pharm D 10/03/2015 7:24 PM    Plan:

## 2015-10-03 NOTE — Transfer of Care (Signed)
Immediate Anesthesia Transfer of Care Note  Patient: [name]  Procedure(s) Performed: Procedure(s): Stereotactic right frontoparietal craniotomy for excisional biopsy of lesion  (Right) APPLICATION OF CRANIAL NAVIGATION (Right)  Patient Location: PACU  Anesthesia Type:General  Level of Consciousness: awake and alert   Airway & Oxygen Therapy: Patient Spontanous Breathing and Patient connected to nasal cannula oxygen  Post-op Assessment: Report given to RN, Post -op Vital signs reviewed and stable and Patient moving all extremities X 4  Post vital signs: Reviewed and stable  Last Vitals:  Filed Vitals:   10/03/15 1114 10/03/15 1332  BP: 142/75 116/54  Pulse: 84 69  Temp: 36.7 C 36.8 C  Resp: 18 18    Complications: No apparent anesthesia complications

## 2015-10-04 ENCOUNTER — Encounter (HOSPITAL_COMMUNITY): Payer: Self-pay | Admitting: Neurosurgery

## 2015-10-04 DIAGNOSIS — G44019 Episodic cluster headache, not intractable: Secondary | ICD-10-CM

## 2015-10-04 DIAGNOSIS — B9689 Other specified bacterial agents as the cause of diseases classified elsewhere: Secondary | ICD-10-CM

## 2015-10-04 LAB — CYSTICERCOSIS AB, IGG BY ELISA: Cysticercosis Ab, IgG: <0.9

## 2015-10-04 NOTE — Care Management Note (Signed)
Case Management Note  Patient Details  Name: Brookelynn Berghorst MRN: [id number] Date of Birth: 1966/08/31  Subjective/Objective:  Pt admitted to [facility] on 1/16 s/p craniotomy for resection of parietal abscess.  PTA, pt independent of ADLS.                    Action/Plan: Will follow for discharge planning as pt progresses.  PT/OT recommending CIR consult.    Expected Discharge Date:   (unknown)               Expected Discharge Plan:  [place]  In-House Referral:     Discharge planning Services  CM Consult  Post Acute Care Choice:    Choice offered to:     DME Arranged:    DME Agency:     HH Arranged:    HH Agency:     Status of Service:  In process, will continue to follow  Medicare Important Message Given:    Date Medicare IM Given:    Medicare IM give by:    Date Additional Medicare IM Given:    Additional Medicare Important Message give by:     If discussed at [place] of Stay Meetings, dates discussed:    Additional Comments:  Reinaldo Raddle, RN, BSN  Trauma/Neuro ICU Case Manager [phone]

## 2015-10-04 NOTE — Evaluation (Signed)
Physical Therapy Evaluation Patient Details Name: [name] MRN: [id number] DOB: 13-Oct-1965 Today's Date: 10/04/2015   History of Present Illness  pt presents post R Frontoparietal Crani with Lesion Biopsy after having headache for 4 days with MRI showing 1cm lesion.  pt with hx of RA.  Clinical Impression  Pt eager for mobility and participates well.  Pt with poor awareness of deficits and L side needing cues throughout session to attend to L side and objects on her L.  Pt with decreased L sided proprioception, kinesthetic sense, and soft touch sensation.  Pt would benefit from further therapies at CIR level to maximize independence prior to D/C to home with sister.      Follow Up Recommendations CIR    Equipment Recommendations  None recommended by PT    Recommendations for Other Services Rehab consult     Precautions / Restrictions Precautions Precautions: Fall Restrictions Weight Bearing Restrictions: No      Mobility  Bed Mobility Overal bed mobility: Needs Assistance Bed Mobility: Supine to Sit     Supine to sit: Min assist;HOB elevated     General bed mobility comments: A with L LE.  Transfers Overall transfer level: Needs assistance Equipment used: 2 person hand held assist Transfers: Sit to/from Stand Sit to Stand: Min assist;+2 physical assistance         General transfer comment: pt needs cues for positioning of Bil LEs and upright posture as pt tends to lean to L side.    Ambulation/Gait Ambulation/Gait assistance: Min assist;+2 physical assistance Ambulation Distance (Feet): 12 Feet (x2) Assistive device: 2 person hand held assist Gait Pattern/deviations: Step-to pattern;Decreased step length - left;Decreased stance time - left;Decreased dorsiflexion - left;Shuffle;Drifts right/left     General Gait Details: pt tends to lean to L side and needs cueing for attending to objects and doorway on L side as pt left her L UE on outside of door frame and  was continuing to try to walk through door.    Stairs            Wheelchair Mobility    Modified Rankin (Stroke Patients Only)       Balance Overall balance assessment: Needs assistance Sitting-balance support: No upper extremity supported;Feet supported Sitting balance-Leahy Scale: Fair     Standing balance support: During functional activity Standing balance-Leahy Scale: Poor                               Pertinent Vitals/Pain Pain Assessment: Faces Faces Pain Scale: Hurts a little bit Pain Location: Headache Pain Descriptors / Indicators: Headache Pain Intervention(s): Monitored during session;Premedicated before session;Repositioned    Home Living Family/patient expects to be discharged to:: Private residence Living Arrangements: Children;Other relatives (sister) Available Help at Discharge: Family;Available 24 hours/day Type of Home: House Home Access: Stairs to enter   [employer] of Steps: 1 Home Layout: One level Home Equipment: None      Prior Function Level of Independence: Independent         Comments: works     Journalist, newspaper   Dominant Hand: Right    Extremity/Trunk Assessment   Upper Extremity Assessment: Defer to OT evaluation           Lower Extremity Assessment: LLE deficits/detail;Difficult to assess due to impaired cognition   LLE Deficits / Details: Actively moving LE with strength grossly 3/5.  Impaired soft touch sensation, proprioception, and kinesthetic sense.  Decreased coordination,  but question if this is mroe due to impaired sensation vs truly coordination deficit.    Cervical / Trunk Assessment: Normal  Communication   Communication: No difficulties (Slow to respond)  Cognition Arousal/Alertness: Awake/alert Behavior During Therapy: Flat affect Overall Cognitive Status: Impaired/Different from baseline Area of Impairment: Attention;Safety/judgement;Awareness;Problem solving   Current  Attention Level: Alternating   Following Commands: Follows one step commands with increased time;Follows multi-step commands inconsistently Safety/Judgement: Decreased awareness of deficits Awareness: Intellectual Problem Solving: Slow processing;Difficulty sequencing;Requires verbal cues;Requires tactile cues General Comments: pt with decreased awareness of L side and at times seems to have difficulty focusing on task.  pt with R sided gaze preference, but can visually attend Bil.      General Comments      Exercises        Assessment/Plan    PT Assessment Patient needs continued PT services  PT Diagnosis Difficulty walking   PT Problem List Decreased strength;Decreased activity tolerance;Decreased balance;Decreased mobility;Decreased coordination;Decreased cognition;Decreased knowledge of use of DME;Decreased safety awareness;Impaired sensation  PT Treatment Interventions DME instruction;Gait training;Stair training;Functional mobility training;Therapeutic activities;Therapeutic exercise;Balance training;Neuromuscular re-education;Cognitive remediation;Patient/family education   PT Goals (Current goals can be found in the Care Plan section) Acute Rehab PT Goals Patient Stated Goal: to know what is going on PT Goal Formulation: With patient Time For Goal Achievement: 10/18/15 Potential to Achieve Goals: Good    Frequency Min 3X/week   Barriers to discharge        Co-evaluation PT/OT/SLP Co-Evaluation/Treatment: Yes Reason for Co-Treatment: Complexity of the patient's impairments (multi-system involvement);For patient/therapist safety PT goals addressed during session: Mobility/safety with mobility;Balance         End of Session Equipment Utilized During Treatment: Gait belt Activity Tolerance: Patient tolerated treatment well Patient left: in chair;with call bell/phone within reach Nurse Communication: Mobility status         Time: 1121-1200 PT Time Calculation  (min) (ACUTE ONLY): 39 min   Charges:   PT Evaluation $PT Eval Moderate Complexity: 1 Procedure     PT G CodesCatarina Hartshorn, [place] 10/04/2015, 3:56 PM

## 2015-10-04 NOTE — Consult Note (Signed)
DENTAL CONSULTATION  Date of Consultation:  10/04/2015  Patient Name:   [name] Date of Birth:   30-Jun-1966 Medical Record Number: [id number]  VITALS: BP 126/103 mmHg  Pulse 85  Temp(Src) 98 F (36.7 C) (Oral)  Resp 20  Ht 5\' 9"  (1.753 m)  Wt 250 lb (113.399 kg)  BMI 36.90 kg/m2  SpO2 98%  LMP 09/27/2011  CHIEF COMPLAINT: Patient referred for evaluation of lower left "rotten tooth".  HPI: [name] is a 50 year old female recently diagnosed with a brain abscess. Dental consultation was requested to evaluate possible dental source of the brain abscess and to evaluate history of lower left quadrant "rotten tooth".  Patient currently denies acute toothache symptoms. Patient indicates that the lower left molar tooth (#18) has hurt off and on for the past 2 years. Patient indicates that she experiences dull, achy pain when it hurts for " hours at a time". Patient indicates that it is spontaneous at times.  The patient last saw a dentist approximately one and a half years ago for evaluation for a dental restoration in [place], [place]. Patient cannot remember the name of the dentist. Patient had been evaluated for extraction of the lower left molar, but did not do so due to a fear of Dentists.  PROBLEM LIST: Patient Active Problem List   Diagnosis Date Noted  . 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 Brain lesion 09/29/2015  . TIA (transient ischemic attack) 09/28/2015  . Headache 09/28/2015  . Adjustment disorder with mixed anxiety and depressed mood 07/02/2013  . Depressive disorder 07/03/2012    PMH: Past Medical History  Diagnosis Date  . Asthma   . Rotator cuff tear   . Perimenopausal   . Slipped intervertebral disc   . Meniscus tear   . Chronic knee pain     right  . Rheumatoid arthritis (HCC)     PSH: Past Surgical History  Procedure Laterality Date  . Cesarean section    . Oophorectomy    . Tubal ligation    . Shoulder surgery    . Craniotomy Right  10/03/2015    Procedure: Stereotactic right frontoparietal craniotomy for excisional biopsy of lesion ;  Surgeon: Consuella Lose, MD;  Location: [facility];  Service: Neurosurgery;  Laterality: Right;  . Application of cranial navigation Right 10/03/2015    Procedure: APPLICATION OF CRANIAL NAVIGATION;  Surgeon: Consuella Lose, MD;  Location: [facility];  Service: Neurosurgery;  Laterality: Right;    ALLERGIES: Allergies  Allergen Reactions  . Diclofenac Sodium Itching  . Meloxicam Itching    MEDICATIONS: Current Facility-Administered Medications  Medication Dose Route Frequency Provider Last Rate Last Dose  . 0.9 %  sodium chloride infusion   Intravenous Continuous Consuella Lose, MD 75 mL/hr at 10/03/15 2233    . acetaminophen (TYLENOL) tablet 650 mg  650 mg Oral Q4H PRN Consuella Lose, MD       Or  . acetaminophen (TYLENOL) suppository 650 mg  650 mg Rectal Q4H PRN Consuella Lose, MD      . albuterol (PROVENTIL) (2.5 MG/3ML) 0.083% nebulizer solution 3 mL  3 mL Inhalation Q6H PRN Costin Karlyne Greenspan, MD   3 mL at 10/01/15 1407  . ALPRAZolam Duanne Moron) tablet 0.25 mg  0.25 mg Oral TID PRN Caren Griffins, MD   0.25 mg at 10/03/15 1117  . aspirin EC tablet 81 mg  81 mg Oral Daily Caren Griffins, MD   81 mg at 10/04/15 0910  . bisacodyl (DULCOLAX) suppository  10 mg  10 mg Rectal Daily PRN Consuella Lose, MD      . ceFEPIme (MAXIPIME) 2 g in dextrose 5 % 50 mL IVPB  2 g Intravenous Q8H Kris Mouton, RPH   2 g at 10/04/15 1159  . docusate sodium (COLACE) capsule 100 mg  100 mg Oral BID Consuella Lose, MD   100 mg at 10/04/15 0910  . HYDROcodone-acetaminophen (NORCO/VICODIN) 5-325 MG per tablet 1 tablet  1 tablet Oral Q4H PRN Consuella Lose, MD   1 tablet at 10/03/15 2111  . HYDROmorphone (DILAUDID) injection 0.25-0.5 mg  0.25-0.5 mg Intravenous Q5 min PRN Duane Boston, MD   0.5 mg at 10/03/15 2011  . labetalol (NORMODYNE,TRANDATE) injection 10-40 mg  10-40 mg  Intravenous Q10 min PRN Consuella Lose, MD      . levETIRAcetam (KEPPRA) 500 mg in sodium chloride 0.9 % 100 mL IVPB  500 mg Intravenous Q12H Consuella Lose, MD   500 mg at 10/04/15 0752  . metroNIDAZOLE (FLAGYL) IVPB 500 mg  500 mg Intravenous 4 times per day Consuella Lose, MD   500 mg at 10/04/15 1159  . morphine 2 MG/ML injection 1-2 mg  1-2 mg Intravenous Q2H PRN Consuella Lose, MD   2 mg at 10/04/15 0102  . ondansetron (ZOFRAN-ODT) disintegrating tablet 4 mg  4 mg Oral Q8H PRN Caren Griffins, MD   4 mg at 10/01/15 1930  . oxyCODONE-acetaminophen (PERCOCET/ROXICET) 5-325 MG per tablet 1-2 tablet  1-2 tablet Oral Q4H PRN Caren Griffins, MD   2 tablet at 10/04/15 1306  . pantoprazole (PROTONIX) injection 40 mg  40 mg Intravenous QHS Consuella Lose, MD   40 mg at 10/03/15 2233  . promethazine (PHENERGAN) injection 6.25-12.5 mg  6.25-12.5 mg Intravenous Q15 min PRN Duane Boston, MD   6.25 mg at 10/03/15 1914  . promethazine (PHENERGAN) tablet 12.5-25 mg  12.5-25 mg Oral Q4H PRN Consuella Lose, MD      . senna (SENOKOT) tablet 8.6 mg  1 tablet Oral BID Consuella Lose, MD   8.6 mg at 10/04/15 0910    LABS: Lab Results  Component Value Date   WBC 4.4 09/28/2015   HGB 12.2 09/28/2015   HCT 36.1 09/28/2015   MCV 95.5 09/28/2015   PLT 276 09/28/2015      Component Value Date/Time   NA 144 10/01/2015 1201   K 3.8 10/01/2015 1201   CL 110 10/01/2015 1201   CO2 26 10/01/2015 1201   GLUCOSE 119* 10/01/2015 1201   BUN 8 10/01/2015 1201   CREATININE 0.88 10/01/2015 1201   CALCIUM 9.5 10/01/2015 1201   GFRNONAA >60 10/01/2015 1201   GFRAA >60 10/01/2015 1201   Lab Results  Component Value Date   INR 1.01 09/30/2015   No results found for: PTT  SOCIAL HISTORY: Social History   Social History  . Marital Status: Divorced    Spouse Name: N/A  . Number of Children: N/A  . Years of Education: N/A   Occupational History  . Not on file.   Social History Main  Topics  . Smoking status: Never Smoker   . Smokeless tobacco: Never Used  . Alcohol Use: No  . Drug Use: No  . Sexual Activity: Not Currently   Other Topics Concern  . Not on file   Social History Narrative    FAMILY HISTORY: Family History  Problem Relation Age of Onset  . Cancer Mother     colon cancer  . Cancer Father  brain cancer  . Alcohol abuse Father   . Diabetes Father     REVIEW OF SYSTEMS: Constitutional: Patient denies fatigue. HENT: Patient currently denies acute toothaches, swellings.  Patient denies dysphagia. + Recent history of headache, brain abscess. Eyes: Patient denies any visual problems at this time. CV: Patient denies having any chest pain. Respiratory: Patient denies any shortness of breath or breathing difficulties. Neurological: + Recent muscle weakness. Skin: Patient denies any acute rashes.  DENTAL HISTORY: CHIEF COMPLAINT: Patient referred for evaluation of lower left "rotten tooth".  HPI: [name] is a 50 year old female recently diagnosed with a brain abscess. Dental consultation was requested to evaluate possible dental source of the brain abscess and to evaluate history of lower left quadrant "rotten tooth".  Patient currently denies acute toothache symptoms. Patient indicates that the lower left molar tooth (#18) has hurt off and on for the past 2 years. Patient indicates that she experiences dull, achy pain when it hurts for " hours at a time". Patient indicates that it is spontaneous at times.  The patient last saw a dentist approximately one and a half years ago for evaluation for a dental restoration in [place], [place]. Patient cannot remember the name of the dentist. Patient had been evaluated for extraction of the lower left molar, but did not do so due to a fear of Dentists.  DENTAL EXAMINATION: GENERAL: Patient well-developed, well-nourished female in no acute distress. HEAD AND NECK: There is no palpable  submandibular lymphadenopathy. The patient denies acute TMJ symptoms. INTRAORAL EXAM: Patient has normal saliva. I do not see any evidence of oral abscess formation. Patient has bilateral mandibular lingual tori. DENTITION: Patient is missing tooth numbers 15, 17, 31, and 32. There are retained root segments in the area of tooth #18. PERIODONTAL: Patient has chronic periodontitis with plaque and calculus accumulations, selective areas gingival recession, and tooth mobility associated with tooth #18. DENTAL CARIES/SUBOPTIMAL RESTORATIONS: Dental caries are associated with tooth #18. I would need a full series of dental rest rule out other dental caries. ENDODONTIC:  Patient currently denies acute pulpitis symptoms. Patient does have a history of previous toothache symptoms involving the lower left molar #18. There is chronic apical periodontitis associated with tooth #18. CROWN AND BRIDGE: There are no crown or bridge restorations. PROSTHODONTIC: There are no partial dentures. OCCLUSION: The patient is a poor occlusal scheme but a stable occlusion.  RADIOGRAPHIC INTERPRETATION:  An orthopantogram was taken on 10/01/2015. There are multiple missing teeth. There are retained root segments in the area of #18. There is periapical radiolucency associated with the apices of tooth #18. There is supra-eruption and drifting of the unopposed teeth into the edentulous areas. There is incipient to moderate bone loss.  ASSESSMENTS: 1.  Brain abscess 2. Chronic apical periodontitis 3. Retained root segment #18 4. Dental caries associated with #18 5. History of acute pulpitis #18 6. Chronic periodontitis with bone loss 7. Gingival recession 8. Tooth mobility 9. Accretions 10. Multiple missing teeth 11. Supra-eruption and drifting of the unopposed teeth into the edentulous areas 12. 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 I discussed the risks, benefits, and complications of  various treatment options with the patient in relationship to her medical and dental conditions. We discussed various treatment options to include no treatment,  extraction of indicated teeth with alveoloplasty, use of general anesthesia or monitored anesthesia care as indicated.  The patient currently wishes to proceed with  extraction of tooth #18 with alveoloplasty. Patient will  consider additional extraction of tooth #16 as well since this tooth is supra-erupting in the area of #18. Patient ideally would like to proceed with general anesthesia due to her history of dental phobia. We'll need to confirm this is a very done due to her recent brain abscess. The patient is currently scheduled for an operating room procedure on Friday morning, 10/07/2015 at 7:30 AM.  2. Discussion of findings with medical team and coordination of future medical and dental care as needed.     Lenn Cal, DDS

## 2015-10-04 NOTE — Progress Notes (Addendum)
PROGRESS NOTE  [name] [id number] DOB: 02/10/1966 DOA: 09/28/2015 PCP: Harle Battiest, MD   Summary  A pleasant healthy 50 year old African-American female with only known past medical history of intermittent asthma who was admitted to [facility] a few days ago with left arm paresthesia found to be due to left brain mass, further workup which included evaluation by neurology, neurosurgery and ID concluded with vision brain biopsy on 10/03/2015, preliminary results consistent with abscess. ID has placed her on antibiotics. She is currently in neuro ICU for 1 day monitoring then she will be moved to the regular floor. Her possible source is left lower jaw tooth which has been chronically infected, dental surgery has been consulted as well.   HPI:  [name] is a 50 y.o. femalewithout significant medical history, who presented to the emergency room with a chief complaint of headache on and off x4 days, and new onset left arm and left leg numbness and weakness.  Subjective / 24 H Interval events  Patient remains in bed, denies any fever or chills, headaches improved, no focal weakness, no chest or abdominal pain, minimal numbness in the left upper extremity improved.  Assessment/Plan:  Headaches and left arm paresthesias due to brain abscess  Likely infectious in etiology, she does have rotten tooth in the left lower jaw, HIV negative, toxoplasmosis negative, pending cysticercosis workup. Neurology, neurosurgery and ID following. Blood cultures negative echo stable.  Bedside LP failed, further workup as directed by neurology and neurosurgery. He underwent excision brain biopsy by neurosurgeon Dr. Kathyrn Sheriff on 10/03/2015, continue aspirin, preliminary results consistent with abscess, ID on board placed on appropriate antibiotics on 10/04/2015 by ID. We'll defer duration and course to ID. Have consult did dental surgeon Dr. Dorothyann Gibbs to see the patient as only possible source  seems to be her tooth. Follow wound culture results.   Palpitations, suspect related to anxiety, now resolved, normal sinus rhythm on EKG. Echo with EF 65-70%, grade 1 chronic and compensated diastolic dysfunction.   Intermittent Asthma  well controlled, no dyspnea or wheezing   Left lower jaw chronic tooth infection. Dental surgery consulted.   Diet: Regular  Fluids: None DVT Prophylaxis: SCD  Code Status: Full Code Family Communication: None at bedside. Discussed with patient's sister over the phone.  Disposition Plan: Likely discharge to home next day of brain biopsy if stable.   Consultants:  Neurology - Dr. Nicole Kindred  Infectious Disease - Dr. Baxter Flattery  Neurosurgery - Dr. Joya Salm  Dental surgeon Dr. Lawana Chambers  Procedures:  2D echo Study Conclusions - Left ventricle: The cavity size was normal. There was moderateconcentric hypertrophy. Systolic function was vigorous. The estimated ejection fraction was in the range of 65% to 70%. Wallmotion was normal; there were no regional wall motionabnormalities. Doppler parameters are consistent with abnormalleft ventricular relaxation (grade 1 diastolic dysfunction).There was no evidence of elevated ventricular filling pressure byDoppler parameters. - Aortic valve: There was no regurgitation. - Aortic root: The aortic root was normal in size. - Left atrium: The atrium was normal in size. - Right ventricle: Systolic function was normal. - Tricuspid valve: There was trivial regurgitation. - Pulmonic valve: There was no regurgitation. - Pulmonary arteries: Systolic pressure was within the normalrange. - Inferior vena cava: The vessel was normal in size. - Pericardium, extracardiac: There was no pericardial effusion.   Antibiotics None   Studies  Mr Jeri Cos Contrast  10/03/2015  CLINICAL DATA:  50 year old female with ring-enhancing right parietal brain lesion. Stereotactic surgical planning for biopsy.  Initial  encounter. EXAM: MRI HEAD WITH CONTRAST TECHNIQUE: Multiplanar, multiecho pulse sequences of the brain and surrounding structures were obtained with intravenous contrast. COMPARISON:  Brain MRI and head CT 09/28/2015 CONTRAST:  31mL MULTIHANCE GADOBENATE DIMEGLUMINE 529 MG/ML IV SOLN FINDINGS: Cystic right parietal lesion with rim enhancement has progressed in size over the last 5 days, now measuring up to 31 mm in diameter (previously up to 15 mm). The peripheral rim enhancement has thickened and is more indistinct. Surrounding FLAIR hyper intensity in a vasogenic edema pattern with mild regional mass effect has also progressed. Mild mass effect on the atrium of the right lateral ventricle. Trace associated blood products or mineralization now. The internal portion of the lesion was restricted on diffusion previously and had a dark T2 rim. The abnormal enhancement extends to the posterior superior right parietal lobe surface and there is now mild associated dural thickening and hyper enhancement (series 4, image 13). No leptomeningeal enhancement. No ventricular enhancement. No ventriculomegaly. No intraventricular debris. No other abnormal intracranial enhancement. No new lesion. No diffusion-weighted imaging today. Basilar cisterns remain patent. IMPRESSION: Solitary right parietal lesion and surrounding edema have roughly doubled in size over the past 5 days. Small area of overlying dural thickening and enhancement now. Mild regional mass effect. Top differential considerations remain brain abscess, cystic neoplasm or metastasis. Electronically Signed   By: Genevie Ann M.D.   On: 10/03/2015 12:47    Objective  Filed Vitals:   10/04/15 0600 10/04/15 0700 10/04/15 0800 10/04/15 0900  BP: 121/58 120/66 110/64 108/62  Pulse: 85 94 84 81  Temp:   98 F (36.7 C)   TempSrc:   Oral   Resp: 14 23 14 13   Height:      Weight:      SpO2: 96% 98% 96% 94%    Intake/Output Summary (Last 24 hours) at 10/04/15  0959 Last data filed at 10/04/15 0900  Gross per 24 hour  Intake 2663.75 ml  Output   1545 ml  Net 1118.75 ml   Filed Weights   09/28/15 1902  Weight: 113.399 kg (250 lb)    Exam:   GENERAL: NAD, clear speech  HEENT: no scleral icterus, PERRL, EOMI, Molar discoloration left lower jaw - no obvious abscess formation or tenderness to palpation.  NECK: supple, posterior scalp. Postop scar and a bandage  LUNGS: good inspiratory effort, CTA biL, no wheezing  HEART: RRR without MRG  ABDOMEN: soft, non tender, nondistended, no gaurding  EXTREMITIES: no clubbing / cyanosis, no peripheral edema  NEUROLOGIC: LUE strength slightly less than RUE, BLE strength 5/5   Data Reviewed: Basic Metabolic Panel:  Recent Labs Lab 09/28/15 1612 10/01/15 1201  NA 143 144  K 3.7 3.8  CL 109 110  CO2 27 26  GLUCOSE 97 119*  BUN 9 8  CREATININE 0.74 0.88  CALCIUM 8.9 9.5   CBC:  Recent Labs Lab 09/28/15 1612  WBC 4.4  NEUTROABS 2.0  HGB 12.2  HCT 36.1  MCV 95.5  PLT 276    Scheduled Meds: . aspirin EC  81 mg Oral Daily  . ceFEPime (MAXIPIME) IV  2 g Intravenous Q8H  . docusate sodium  100 mg Oral BID  . levETIRAcetam  500 mg Intravenous Q12H  . metronidazole  500 mg Intravenous 4 times per day  . pantoprazole (PROTONIX) IV  40 mg Intravenous QHS  . senna  1 tablet Oral BID  . vancomycin  1,000 mg Intravenous Q8H    Time  spent: 25 minutes    Signature  Thurnell Lose M.D on 10/04/2015 at 9:59 AM  Between 7am to 7pm - Pager - [phone], After 7pm go to [email or web address] - password [facility]  [facility]  - Office  [phone]

## 2015-10-04 NOTE — Progress Notes (Signed)
Pt seen and examined. No issues overnight. Reports improvement in left arm strength and numbness. Minimal HA.  EXAM: Temp:  [97.5 F (36.4 C)-98.5 F (36.9 C)] 98 F (36.7 C) (01/17 1157) Pulse Rate:  [69-100] 85 (01/17 1300) Resp:  [8-23] 19 (01/17 1300) BP: (108-161)/(38-86) 115/38 mmHg (01/17 1300) SpO2:  [91 %-99 %] 98 % (01/17 1300) Intake/Output      01/16 0701 - 01/17 0700 01/17 0701 - 01/18 0700   P.O. 120    I.V. (mL/kg) 1933.8 (17.1) 450 (4)   IV Piggyback 355 105   Total Intake(mL/kg) 2408.8 (21.2) 555 (4.9)   Urine (mL/kg/hr) 1210 (0.4) 185 (0.3)   Blood 150 (0.1)    Total Output 1360 185   Net +1048.8 +370         Awake, alert, oriented Speech fluent Good strength Wound c/d/i  LABS: Lab Results  Component Value Date   CREATININE 0.88 10/01/2015   BUN 8 10/01/2015   NA 144 10/01/2015   K 3.8 10/01/2015   CL 110 10/01/2015   CO2 26 10/01/2015   Lab Results  Component Value Date   WBC 4.4 09/28/2015   HGB 12.2 09/28/2015   HCT 36.1 09/28/2015   MCV 95.5 09/28/2015   PLT 276 09/28/2015    IMPRESSION: - 50 y.o. female s/p removal of brain abscess, remains neurologically stable - Placed on broad spectrum abx yesterday postop  PLAN: - Will defer to ID for narrowing of abx. - Can transfer to floor today

## 2015-10-04 NOTE — Consult Note (Signed)
[name]       Reason for Consult: brain abscess    Referring Physician: Dr. Nobie Putnam  Active Problems:   TIA (transient ischemic attack)   Headache   Brain lesion   Brain abscess   . aspirin EC  81 mg Oral Daily  . ceFEPime (MAXIPIME) IV  2 g Intravenous Q8H  . docusate sodium  100 mg Oral BID  . levETIRAcetam  500 mg Intravenous Q12H  . metronidazole  500 mg Intravenous 4 times per day  . pantoprazole (PROTONIX) IV  40 mg Intravenous QHS  . senna  1 tablet Oral BID  . vancomycin  1,000 mg Intravenous Q8H    Recommendations: Cefepime and flagyl pending cultures I will stop vancomycin picc line   Assessment: She has brain abscess, may be from recent tooth abscess.  GNR mainly in gram stain but also GPC in pairs.   No pulmonary symptoms, no ear symptoms.  No previous surgery to suggest MRSA.    Antibiotics: Vancomycin, cefepime, flagyl  HPI: [name] is a 50 y.o. female with RA on NSAIDS presented with left arm and leg weakness, numbness, headache for 4 days and MRI noted a mass.  Taken to OR yesterday and c/w abscess. Gram stain with GNR and GPC in pairs.  No fever.  Recent tooth pain and panorex with some decay.  No cough, no SOB.  No history of headaches.   MRI independently reviewed and abscess area noted visibly.    Review of Systems:  Constitutional: negative for fevers, chills and sweats Gastrointestinal: negative for nausea, vomiting and diarrhea Musculoskeletal: negative for myalgias and arthralgias All other systems reviewed and are negative   Past Medical History  Diagnosis Date  . Asthma   . Rotator cuff tear   . Perimenopausal   . Slipped intervertebral disc   . Meniscus tear   . Chronic knee pain     right  . Rheumatoid arthritis [facility])     Social History  Substance Use Topics  . Smoking status: Never Smoker   . Smokeless tobacco: Never Used  . Alcohol Use: No    Family History  Problem Relation  Age of Onset  . Cancer Mother     colon cancer  . Cancer Father     brain cancer  . Alcohol abuse Father   . Diabetes Father     Allergies  Allergen Reactions  . Diclofenac Sodium Itching  . Meloxicam Itching    Physical Exam: Constitutional: in no apparent distress and alert  Filed Vitals:   10/04/15 1200 10/04/15 1300  BP: 119/59 115/38  Pulse: 85 85  Temp:    Resp: 21 19   EYES: anicteric ENMT: no thrush Cardiovascular: Cor RRR and No murmurs Respiratory: CTA B; normal respiratory effort GI: Bowel sounds are normal Musculoskeletal: peripheral pulses normal, no pedal edema, no clubbing or cyanosis Skin: negatives: no rash Hematologic: no cervical lad  Lab Results  Component Value Date   WBC 4.4 09/28/2015   HGB 12.2 09/28/2015   HCT 36.1 09/28/2015   MCV 95.5 09/28/2015   PLT 276 09/28/2015    Lab Results  Component Value Date   CREATININE 0.88 10/01/2015   BUN 8 10/01/2015   NA 144 10/01/2015   K 3.8 10/01/2015   CL 110 10/01/2015   CO2 26 10/01/2015    Lab Results  Component Value Date   ALT 12* 10/01/2015   AST 19 10/01/2015   ALKPHOS  78 10/01/2015     Microbiology: Recent Results (from the past 240 hour(s))  Culture, blood (routine x 2)     Status: None (Preliminary result)   Collection Time: 09/29/15 10:30 PM  Result Value Ref Range Status   Specimen Description BLOOD RIGHT HAND  Final   Special Requests BOTTLES DRAWN AEROBIC AND ANAEROBIC 6CC  Final   Culture   Final    NO GROWTH 4 DAYS Performed at [facility]    Report Status PENDING  Incomplete  Culture, blood (routine x 2)     Status: None (Preliminary result)   Collection Time: 09/29/15 10:38 PM  Result Value Ref Range Status   Specimen Description BLOOD RIGHT ARM  Final   Special Requests BOTTLES DRAWN AEROBIC AND ANAEROBIC 6CC  Final   Culture   Final    NO GROWTH 4 DAYS Performed at [facility]    Report Status PENDING  Incomplete  Surgical pcr screen      Status: Abnormal   Collection Time: 10/03/15  1:22 PM  Result Value Ref Range Status   MRSA, PCR NEGATIVE NEGATIVE Final   Staphylococcus aureus POSITIVE (A) NEGATIVE Final    Comment:        The Xpert SA Assay (FDA approved for NASAL specimens in patients over 64 years of age), is one component of a comprehensive surveillance program.  Test performance has been validated by [facility] for patients greater than or equal to 52 year old. It is not intended to diagnose infection nor to guide or monitor treatment.   Anaerobic culture     Status: None (Preliminary result)   Collection Time: 10/03/15  6:03 PM  Result Value Ref Range Status   Specimen Description WOUND  Final   Special Requests SWAB OF TUMOR RIGHT PARIETAL LESION  Final   Gram Stain   Final    MODERATE WBC PRESENT,BOTH PMN AND MONONUCLEAR NO SQUAMOUS EPITHELIAL CELLS SEEN ABUNDANT GRAM NEGATIVE RODS FEW GRAM POSITIVE COCCI IN PAIRS Gram Stain Report Called to,Read Back By and Verified With: Gram Stain Report Called to,Read Back By and Verified With: S HOCUTT RN [id number] 10/03/15 BY A BROWNING Performed at [facility]    Culture PENDING  Incomplete   Report Status PENDING  Incomplete  Wound culture     Status: None (Preliminary result)   Collection Time: 10/03/15  6:03 PM  Result Value Ref Range Status   Specimen Description WOUND  Final   Special Requests SWAB OF TUMOR RIGHT PARIETAL LESION  Final   Gram Stain   Final    MODERATE WBC PRESENT,BOTH PMN AND MONONUCLEAR NO SQUAMOUS EPITHELIAL CELLS SEEN ABUNDANT GRAM NEGATIVE RODS FEW GRAM POSITIVE COCCI IN PAIRS Performed at [facility]    Culture PENDING  Incomplete   Report Status PENDING  Incomplete  Gram stain     Status: None   Collection Time: 10/03/15  6:03 PM  Result Value Ref Range Status   Specimen Description WOUND  Final   Special Requests SWAB OF TUMOR RIGHT PARIETAL LESION  Final   Gram Stain   Final    MODERATE WBC PRESENT,BOTH PMN  AND MONONUCLEAR ABUNDANT GRAM NEGATIVE RODS FEW GRAM POSITIVE COCCI IN PAIRS Gram Stain Report Called to,Read Back By and Verified With: S HOCUTT RN 2133 10/03/15 A BROWNING    Report Status 10/03/2015 FINAL  Final  Gram stain     Status: None   Collection Time: 10/03/15  6:03 PM  Result  Value Ref Range Status   Specimen Description TISSUE  Final   Special Requests TUMOR RIGHT PARIETAL MASS  Final   Gram Stain   Final    FEW WBC PRESENT,BOTH PMN AND MONONUCLEAR NO ORGANISMS SEEN    Report Status 10/03/2015 FINAL  Final  Tissue culture     Status: None (Preliminary result)   Collection Time: 10/03/15  6:07 PM  Result Value Ref Range Status   Specimen Description TISSUE  Final   Special Requests RIGHT PARIETAL MASS  Final   Gram Stain   Final    FEW WBC PRESENT,BOTH PMN AND MONONUCLEAR NO ORGANISMS SEEN Performed at [facility] Performed at [facility]    Culture PENDING  Incomplete   Report Status PENDING  Incomplete    Scharlene Gloss, [facility] [email or web address] [phone] pager  [phone] cell 10/04/2015, 1:41 PM

## 2015-10-04 NOTE — Progress Notes (Signed)
Occupational Therapy Re-Evaluation Patient Details Name: [name] MRN: [id number] DOB: [birth year] Today's Date: 10/04/2015    History of Present Illness pt presents post R Frontoparietal Crani with Lesion Biopsy after having headache for 4 days with MRI showing 1cm lesion.  pt with hx of RA.   Clinical Impression   Pt seen prior to surgery and demostrated mild generalized weakness and sensory deficits with LUE. S/p surgery, Pt demonstrates greater LUE deficits, and LUE neglect during mobility. Pt with R gaze preference during mobility. Pt running into L door froma without realizing her L arm was running  Into the doorframe. Nsg notified of concerns. At this time, rec CIR consult. Will follow acutely to further assess discharge needs and address established goals.      Follow Up Recommendations  CIR;Supervision/Assistance - 24 hour    Equipment Recommendations  3 in 1 bedside comode    Recommendations for Other Services Rehab consult     Precautions / Restrictions Precautions Precautions: Fall Restrictions Weight Bearing Restrictions: No      Mobility Bed Mobility Overal bed mobility: Needs Assistance Bed Mobility: Supine to Sit     Supine to sit: Min assist;HOB elevated     General bed mobility comments: A with L LE.  Transfers Overall transfer level: Needs assistance Equipment used: 2 person hand held assist Transfers: Sit to/from [facility] Sit to Stand: Min assist;+2 physical assistance         General transfer comment: pt needs cues for positioning of Bil LEs and upright posture as pt tends to lean to L side.  L bias    Balance Overall balance assessment: Needs assistance Sitting-balance support: No upper extremity supported;Feet supported Sitting balance-Leahy Scale: Fair     Standing balance support: During functional activity Standing balance-Leahy Scale: Poor                              ADL Overall ADL's : Needs  assistance/impaired Eating/Feeding: Set up   Grooming: Set up   Upper Body Bathing: Minimal assitance   Lower Body Bathing: Moderate assistance   Upper Body Dressing : Minimal assistance   Lower Body Dressing: Moderate assistance   Toilet Transfer: +2 for physical assistance;Minimal assistance     Toileting - Clothing Manipulation Details (indicate cue type and reason): foley     Functional mobility during ADLs: Minimal assistance;+2 for physical assistance General ADL Comments: Pt ambulating to bathroom. Running into L doorframe of bathroom. leaving LUE outside of bathroom adn required phusical cues to bring LUE into bathroom     Vision Additional Comments: appears intact   Perception Perception Comments: will further assess. ? L neglect during mobility.    Praxis Praxis Praxis tested?: Within functional limits    Pertinent Vitals/Pain Pain Assessment: Faces Faces Pain Scale: Hurts a little bit Pain Location: head Pain Descriptors / Indicators: Headache Pain Intervention(s): Limited activity within patient's tolerance     Hand Dominance Right   Extremity/Trunk Assessment Upper Extremity Assessment Upper Extremity Assessment: LUE deficits/detail LUE Deficits / Details: generalized weakness; apparent sensory deficits; stereognosis and tactile discrimination and proprioception appear impaired LUE Sensation: decreased light touch;decreased proprioception   Lower Extremity Assessment Lower Extremity Assessment: Defer to PT evaluation LLE Deficits / Details: Actively moving LE with strength grossly 3/5.  Impaired soft touch sensation, proprioception, and kinesthetic sense.  Decreased coordination, but question if this is mroe due to impaired sensation vs truly coordination deficit.  LLE Sensation: decreased light touch;decreased proprioception LLE Coordination: decreased fine motor;decreased gross motor   Cervical / Trunk Assessment Cervical / Trunk Assessment:  Other exceptions (L bias)   Communication Communication Communication: No difficulties   Cognition Arousal/Alertness: Awake/alert Behavior During Therapy: Flat affect Overall Cognitive Status: Impaired/Different from baseline Area of Impairment: Attention;Safety/judgement;Awareness;Problem solving   Current Attention Level: Alternating   Following Commands: Follows one step commands with increased time Safety/Judgement: Decreased awareness of deficits Awareness: Emergent Problem Solving: Slow processing;Difficulty sequencing;Requires verbal cues;Requires tactile cues General Comments: R bias during activities   General Comments       Exercises       Shoulder Instructions      Home Living Family/patient expects to be discharged to:: Private residence Living Arrangements: Children;Other relatives Available Help at Discharge: Family;Available 24 hours/day Type of Home: House Home Access: Stairs to enter [employer] of Steps: 1   Home Layout: One level Alternate Level Stairs-Number of Steps: flight   Bathroom Shower/Tub: Tub/shower unit;Walk-in shower Shower/tub characteristics: Industrial/product designer: Yes How Accessible: Accessible via walker Home Equipment: None          Prior Functioning/Environment Level of Independence: Independent        Comments: works    OT Diagnosis: Generalized weakness;Cognitive deficits   OT Problem List: Decreased strength;Decreased activity tolerance;Impaired balance (sitting and/or standing);Impaired vision/perception;Decreased coordination;Decreased safety awareness;Impaired sensation;Obesity;Impaired UE functional use;Pain   OT Treatment/Interventions:      OT Goals(Current goals can be found in the care plan section) Acute Rehab OT Goals Patient Stated Goal: to be independent OT Goal Formulation: With patient Time For Goal Achievement: 10/16/15 Potential to Achieve Goals:  Good ADL Goals Additional ADL Goal #1: Pt will verbalize understadning of safety issues and use of compensation to accomodate for sensory changes in LUE  OT Frequency: Min 2X/week   Barriers to D/C:            Co-evaluation PT/OT/SLP Co-Evaluation/Treatment: Yes Reason for Co-Treatment: Complexity of the patient's impairments (multi-system involvement);For patient/therapist safety PT goals addressed during session: Mobility/safety with mobility;Balance OT goals addressed during session: ADL's and self-care;Strengthening/ROM      End of Session Equipment Utilized During Treatment: Gait belt Nurse Communication: Mobility status;Other (comment) (use BSC)  Activity Tolerance: Patient tolerated treatment well Patient left: in chair;with call bell/phone within reach;with family/visitor present   Time: 1125-1210 OT Time Calculation (min): 45 min Charges:  OT General Charges $OT Visit: 1 Procedure OT Evaluation $OT Re-eval: 1 Procedure OT Treatments $Self Care/Home Management : 8-22 mins G-Codes:    Kasandra Fehr,HILLARY 2015/10/14, 4:16 PM   [facility], OTR/L  [phone] Oct 14, 2015

## 2015-10-05 DIAGNOSIS — K045 Chronic apical periodontitis: Secondary | ICD-10-CM

## 2015-10-05 DIAGNOSIS — K029 Dental caries, unspecified: Secondary | ICD-10-CM

## 2015-10-05 DIAGNOSIS — M264 Malocclusion, unspecified: Secondary | ICD-10-CM

## 2015-10-05 DIAGNOSIS — K083 Retained dental root: Secondary | ICD-10-CM

## 2015-10-05 DIAGNOSIS — R1110 Vomiting, unspecified: Secondary | ICD-10-CM

## 2015-10-05 DIAGNOSIS — G060 Intracranial abscess and granuloma: Principal | ICD-10-CM

## 2015-10-05 DIAGNOSIS — K0530 Chronic periodontitis, unspecified: Secondary | ICD-10-CM

## 2015-10-05 DIAGNOSIS — D62 Acute posthemorrhagic anemia: Secondary | ICD-10-CM

## 2015-10-05 DIAGNOSIS — G458 Other transient cerebral ischemic attacks and related syndromes: Secondary | ICD-10-CM

## 2015-10-05 DIAGNOSIS — J45909 Unspecified asthma, uncomplicated: Secondary | ICD-10-CM

## 2015-10-05 LAB — BASIC METABOLIC PANEL
ANION GAP: 7 (ref 5–15)
ANION GAP: 5 (ref 5–15)
BUN: 7 mg/dL (ref 6–20)
BUN: 6 mg/dL (ref 6–20)
CALCIUM: 8.3 mg/dL — AB (ref 8.9–10.3)
CO2: 26 mmol/L (ref 22–32)
CO2: 27 mmol/L (ref 22–32)
Calcium: 8.5 mg/dL — ABNORMAL LOW (ref 8.9–10.3)
Chloride: 108 mmol/L (ref 101–111)
Chloride: 109 mmol/L (ref 101–111)
Creatinine, Ser: 0.81 mg/dL (ref 0.44–1.00)
Creatinine, Ser: 0.73 mg/dL (ref 0.44–1.00)
GLUCOSE: 118 mg/dL — AB (ref 65–99)
Glucose, Bld: 135 mg/dL — ABNORMAL HIGH (ref 65–99)
Potassium: 3.8 mmol/L (ref 3.5–5.1)
Potassium: 3.5 mmol/L (ref 3.5–5.1)
SODIUM: 141 mmol/L (ref 135–145)
SODIUM: 141 mmol/L (ref 135–145)

## 2015-10-05 LAB — CULTURE, BLOOD (ROUTINE X 2)
CULTURE: NO GROWTH
Culture: NO GROWTH

## 2015-10-05 LAB — CBC
HEMATOCRIT: 32.3 % — AB (ref 36.0–46.0)
Hemoglobin: 11 g/dL — ABNORMAL LOW (ref 12.0–15.0)
MCH: 32.4 pg (ref 26.0–34.0)
MCHC: 34.1 g/dL (ref 30.0–36.0)
MCV: 95.3 fL (ref 78.0–100.0)
PLATELETS: 257 10*3/uL (ref 150–400)
RBC: 3.39 MIL/uL — ABNORMAL LOW (ref 3.87–5.11)
RDW: 12.4 % (ref 11.5–15.5)
WBC: 7.5 10*3/uL (ref 4.0–10.5)

## 2015-10-05 MED ORDER — PANTOPRAZOLE SODIUM 40 MG PO TBEC
40.0000 mg | DELAYED_RELEASE_TABLET | Freq: Every day | ORAL | Status: DC
  Administered 2015-10-05 – 2015-10-06 (×2): 40 mg via ORAL
  Filled 2015-10-05 (×2): qty 1

## 2015-10-05 MED ORDER — HYDROMORPHONE HCL 1 MG/ML IJ SOLN: 1.0000 mg | INTRAMUSCULAR | Status: DC | PRN

## 2015-10-05 MED ORDER — MAGNESIUM CITRATE PO SOLN
1.0000 | Freq: Once | ORAL | Status: AC
  Administered 2015-10-06: 1 via ORAL
  Filled 2015-10-05 (×2): qty 296

## 2015-10-05 MED ORDER — POLYETHYLENE GLYCOL 3350 17 G PO PACK
17.0000 g | PACK | Freq: Once | ORAL | Status: AC
  Administered 2015-10-05: 17 g via ORAL
  Filled 2015-10-05: qty 1

## 2015-10-05 MED ORDER — DEXAMETHASONE SODIUM PHOSPHATE 4 MG/ML IJ SOLN
4.0000 mg | Freq: Four times a day (QID) | INTRAMUSCULAR | Status: AC
  Administered 2015-10-05 – 2015-10-07 (×8): 4 mg via INTRAVENOUS
  Filled 2015-10-05 (×8): qty 1

## 2015-10-05 MED ORDER — BISACODYL 10 MG RE SUPP
10.0000 mg | Freq: Every day | RECTAL | Status: DC
  Administered 2015-10-05: 10 mg via RECTAL
  Filled 2015-10-05: qty 1

## 2015-10-05 MED ORDER — DOCUSATE SODIUM 100 MG PO CAPS
200.0000 mg | ORAL_CAPSULE | Freq: Two times a day (BID) | ORAL | Status: DC
  Administered 2015-10-05 – 2015-10-07 (×5): 200 mg via ORAL
  Filled 2015-10-05 (×5): qty 2

## 2015-10-05 NOTE — Consult Note (Signed)
Physical Medicine and Rehabilitation Consult Reason for Consult: Brain abscess Referring Physician: [facility]   HPI: [name] is a 50 y.o. right handed female with history of asthma. Patient lives alone with 2 young children and independent prior to admission. She plans to stay with her sister on discharge who works day shift as a Radio producer, but notes she has another sister who will stay with her during the day. One level home with one-step entry. Presented 10/02/2015 with sudden onset of headache and left-sided paresthesias and mild weakness. CT and MRI of the brain demonstrated a ring-enhancing right postcentral lesions suspicious for abscess. She did have a history of recent tooth infection. Underwent stereotactic right frontoparietal craniotomy for resection of lesion 10/03/2015 per Dr. Kathyrn Sheriff. Gram stain with gram-negative rods but also GPC in pairs. Infectious disease consult at presently on Maxipime with ?duration. Keppra for seizure prophylaxis. Tolerating a regular diet. Physical therapy evaluation completed 10/04/2015 with recommendations of physical medicine rehabilitation consult.  Review of Systems  Constitutional: Negative for fever and chills.  HENT: Negative for hearing loss.   Eyes: Negative for blurred vision and double vision.  Respiratory: Negative for cough and shortness of breath.   Cardiovascular: Negative for chest pain, palpitations and leg swelling.  Gastrointestinal: Positive for constipation. Negative for nausea and vomiting.  Genitourinary: Negative for dysuria and hematuria.  Musculoskeletal: Positive for myalgias.  Skin: Negative for rash.  Neurological: Positive for dizziness, weakness and headaches. Negative for seizures.  All other systems reviewed and are negative.  Past Medical History  Diagnosis Date  . Asthma   . Rotator cuff tear   . Perimenopausal   . Slipped intervertebral disc   . Meniscus tear   . Chronic knee pain     right  .  Rheumatoid arthritis [facility])    Past Surgical History  Procedure Laterality Date  . Cesarean section    . Oophorectomy    . Tubal ligation    . Shoulder surgery    . Craniotomy Right 10/03/2015    Procedure: Stereotactic right frontoparietal craniotomy for excisional biopsy of lesion ;  Surgeon: Consuella Lose, MD;  Location: [place] [facility];  Service: Neurosurgery;  Laterality: Right;  . Application of cranial navigation Right 10/03/2015    Procedure: APPLICATION OF CRANIAL NAVIGATION;  Surgeon: Consuella Lose, MD;  Location: [place] [facility];  Service: Neurosurgery;  Laterality: Right;   Family History  Problem Relation Age of Onset  . Cancer Mother     colon cancer  . Cancer Father     brain cancer  . Alcohol abuse Father   . Diabetes Father    Social History:  reports that she has never smoked. She has never used smokeless tobacco. She reports that she does not drink alcohol or use illicit drugs. Allergies:  Allergies  Allergen Reactions  . Diclofenac Sodium Itching  . Meloxicam Itching   Medications Prior to Admission  Medication Sig Dispense Refill  . albuterol (PROVENTIL HFA;VENTOLIN HFA) 108 (90 BASE) MCG/ACT inhaler Inhale 2 puffs into the lungs every 6 (six) hours as needed for wheezing or shortness of breath. For wheezing    . cholecalciferol (VITAMIN D) 1000 UNITS tablet Take 1,000 Units by mouth daily with breakfast.    . cyclobenzaprine (FLEXERIL) 10 MG tablet Take 1 tablet (10 mg total) by mouth 2 (two) times daily as needed for muscle spasms. 20 tablet 0  . erythromycin ophthalmic ointment Place 1 application into the right eye daily.    Marland Kitchen  methocarbamol (ROBAXIN) 500 MG tablet Take 1 tablet (500 mg total) by mouth 2 (two) times daily. 20 tablet 0  . Multiple Vitamin (MULTIVITAMIN WITH MINERALS) TABS tablet Take 1 tablet by mouth daily.    . naproxen (NAPROSYN) 500 MG tablet Take 1 tablet (500 mg total) by mouth 2 (two) times daily. 30 tablet 0  . naproxen sodium  (ANAPROX) 220 MG tablet Take 440 mg by mouth 2 (two) times daily as needed (pain).    Marland Kitchen oxyCODONE-acetaminophen (PERCOCET) 5-325 MG per tablet Take 1-2 tablets by mouth every 4 (four) hours as needed for severe pain. 20 tablet 0  . sodium chloride (MURO 128) 2 % ophthalmic solution Place 1 drop into the right eye 2 (two) times daily.    . sodium chloride (MURO 128) 5 % ophthalmic ointment Place 1 application into the right eye at bedtime.    . traMADol (ULTRAM) 50 MG tablet Take 1 tablet (50 mg total) by mouth every 6 (six) hours as needed. 15 tablet 0    Home: Home Living Family/patient expects to be discharged to:: Private residence Living Arrangements: Children, Other relatives Available Help at Discharge: Family, Available 24 hours/day Type of Home: House Home Access: Stairs to enter [employer] of Steps: 1 Home Layout: One level Alternate Level Stairs-Number of Steps: flight Bathroom Shower/Tub: Tub/shower unit, Multimedia programmer: Programmer, systems: Yes Home Equipment: None  Functional History: Prior Function Level of Independence: Independent Comments: works Functional Status:  Mobility: Bed Mobility Overal bed mobility: Needs Assistance Bed Mobility: Supine to Sit Supine to sit: Min assist, HOB elevated Sit to supine:  (pt on BSC with RN on PT arrival) General bed mobility comments: A with L LE. Transfers Overall transfer level: Needs assistance Equipment used: 2 person hand held assist Transfers: Sit to/from Stand, Stand Pivot Transfers Sit to Stand: Min assist, +2 physical assistance Stand pivot transfers: Supervision General transfer comment: pt needs cues for positioning of Bil LEs and upright posture as pt tends to lean to L side.  L bias Ambulation/Gait Ambulation/Gait assistance: Min assist, +2 physical assistance Ambulation Distance (Feet): 12 Feet (x2) Assistive device: 2 person hand held assist Gait Pattern/deviations:  Step-to pattern, Decreased step length - left, Decreased stance time - left, Decreased dorsiflexion - left, Shuffle, Drifts right/left General Gait Details: pt tends to lean to L side and needs cueing for attending to objects and doorway on L side as pt left her L UE on outside of door frame and was continuing to try to walk through door.      ADL: ADL Overall ADL's : Needs assistance/impaired Eating/Feeding: Set up Grooming: Set up Upper Body Bathing: Minimal assitance Lower Body Bathing: Moderate assistance Upper Body Dressing : Minimal assistance Lower Body Dressing: Moderate assistance Toilet Transfer: +2 for physical assistance, Minimal assistance Toileting - Clothing Manipulation Details (indicate cue type and reason): foley Functional mobility during ADLs: Minimal assistance, +2 for physical assistance General ADL Comments: Pt ambulating to bathroom. Running into L doorframe of bathroom. leaving LUE outside of bathroom adn required phusical cues to bring LUE into bathroom  Cognition: Cognition Overall Cognitive Status: Impaired/Different from baseline Orientation Level: Oriented X4 Cognition Arousal/Alertness: Awake/alert Behavior During Therapy: Flat affect Overall Cognitive Status: Impaired/Different from baseline Area of Impairment: Attention, Safety/judgement, Awareness, Problem solving Current Attention Level: Alternating Following Commands: Follows one step commands with increased time Safety/Judgement: Decreased awareness of deficits Awareness: Emergent Problem Solving: Slow processing, Difficulty sequencing, Requires verbal cues, Requires tactile cues General  Comments: R bias during activities  Blood pressure 125/55, pulse 70, temperature 97.8 F (36.6 C), temperature source Oral, resp. rate 16, height 5\' 9"  (1.753 m), weight 113.399 kg (250 lb), last menstrual period 09/27/2011, SpO2 95 %. Physical Exam  Vitals reviewed. Constitutional: She is oriented to person,  place, and time. She appears well-developed and well-nourished.  HENT:  Head: Normocephalic.  Right Ear: External ear normal.  Left Ear: External ear normal.  Eyes: Conjunctivae and EOM are normal.  Neck: Normal range of motion. Neck supple. No thyromegaly present.  Cardiovascular: Normal rate and regular rhythm.   Respiratory: Effort normal and breath sounds normal. No respiratory distress.  GI: Soft. Bowel sounds are normal. She exhibits no distension.  Musculoskeletal:  PROM No tenderness in extremities  Neurological: She is alert and oriented to person, place, and time.  Follows full commands No sensation distal to left knee Motor: R UE/RLE: 5/5 proximal distal L UE: 3+/5 proximal distal LLE: 3-5 proximal DTRs increased on left side versus right-sided  Skin: Skin is warm and dry.  Psychiatric: Her behavior is normal. Judgment normal.  Flat affect    Results for orders placed or performed during the hospital encounter of 09/28/15 (from the past 24 hour(s))  CBC     Status: Abnormal   Collection Time: 10/05/15  4:14 AM  Result Value Ref Range   WBC 7.5 4.0 - 10.5 K/uL   RBC 3.39 (L) 3.87 - 5.11 MIL/uL   Hemoglobin 11.0 (L) 12.0 - 15.0 g/dL   HCT 32.3 (L) 36.0 - 46.0 %   MCV 95.3 78.0 - 100.0 fL   MCH 32.4 26.0 - 34.0 pg   MCHC 34.1 30.0 - 36.0 g/dL   RDW 12.4 11.5 - 15.5 %   Platelets 257 150 - 400 K/uL  Basic metabolic panel     Status: Abnormal   Collection Time: 10/05/15  4:14 AM  Result Value Ref Range   Sodium 141 135 - 145 mmol/L   Potassium 3.8 3.5 - 5.1 mmol/L   Chloride 108 101 - 111 mmol/L   CO2 26 22 - 32 mmol/L   Glucose, Bld 135 (H) 65 - 99 mg/dL   BUN 7 6 - 20 mg/dL   Creatinine, Ser 0.81 0.44 - 1.00 mg/dL   Calcium 8.3 (L) 8.9 - 10.3 mg/dL   GFR calc non Af Amer >60 >60 mL/min   GFR calc Af Amer >60 >60 mL/min   Anion gap 7 5 - 15   Mr Jeri Cos Contrast  10/03/2015  CLINICAL DATA:  50 year old female with ring-enhancing right parietal brain  lesion. Stereotactic surgical planning for biopsy. Initial encounter. EXAM: MRI HEAD WITH CONTRAST TECHNIQUE: Multiplanar, multiecho pulse sequences of the brain and surrounding structures were obtained with intravenous contrast. COMPARISON:  Brain MRI and head CT 09/28/2015 CONTRAST:  64mL MULTIHANCE GADOBENATE DIMEGLUMINE 529 MG/ML IV SOLN FINDINGS: Cystic right parietal lesion with rim enhancement has progressed in size over the last 5 days, now measuring up to 31 mm in diameter (previously up to 15 mm). The peripheral rim enhancement has thickened and is more indistinct. Surrounding FLAIR hyper intensity in a vasogenic edema pattern with mild regional mass effect has also progressed. Mild mass effect on the atrium of the right lateral ventricle. Trace associated blood products or mineralization now. The internal portion of the lesion was restricted on diffusion previously and had a dark T2 rim. The abnormal enhancement extends to the posterior superior right parietal lobe surface and there  is now mild associated dural thickening and hyper enhancement (series 4, image 13). No leptomeningeal enhancement. No ventricular enhancement. No ventriculomegaly. No intraventricular debris. No other abnormal intracranial enhancement. No new lesion. No diffusion-weighted imaging today. Basilar cisterns remain patent. IMPRESSION: Solitary right parietal lesion and surrounding edema have roughly doubled in size over the past 5 days. Small area of overlying dural thickening and enhancement now. Mild regional mass effect. Top differential considerations remain brain abscess, cystic neoplasm or metastasis. Electronically Signed   By: Genevie Ann M.D.   On: 10/03/2015 12:47    Assessment/Plan: Diagnosis: Brain abscess Labs and images independently reviewed.  Records reviewed and summated above.  1. Does the need for close, 24 hr/day medical supervision in concert with the patient's rehab needs make it unreasonable for this  patient to be served in a less intensive setting? Yes 2. Co-Morbidities requiring supervision/potential complications: asthma (monitor with increase physical exertion), ABLA (transfuse if necessary to ensure appropriate perfusion for increased activity tolerance) 3. Due to safety, skin/wound care, disease management, medication administration, pain management and patient education, does the patient require 24 hr/day rehab nursing? Yes 4. Does the patient require coordinated care of a physician, rehab nurse, PT (1-2 hrs/day, 5 days/week) and OT (1-2 hrs/day, 5 days/week) to address physical and functional deficits in the context of the above medical diagnosis(es)? Yes Addressing deficits in the following areas: endurance, locomotion, strength, transferring, bathing, dressing, grooming, toileting and psychosocial support 5. Can the patient actively participate in an intensive therapy program of at least 3 hrs of therapy per day at least 5 days per week? Potentially 6. The potential for patient to make measurable gains while on inpatient rehab is excellent 7. Anticipated functional outcomes upon discharge from inpatient rehab are modified independent and supervision  with PT, modified independent with OT, n/a with SLP. 8. Estimated rehab length of stay to reach the above functional goals is: 10-12 days. 9. Does the patient have adequate social supports and living environment to accommodate these discharge functional goals? Yes 10. Anticipated D/C setting: Home 11. Anticipated post D/C treatments: HH therapy and Home excercise program 12. Overall Rehab/Functional Prognosis: excellent  RECOMMENDATIONS: This patient's condition is appropriate for continued rehabilitative care in the following setting: CIR once pt demonstartes ability to tolerate 3 hours therapy/day. Patient has agreed to participate in recommended program. Potentially Note that insurance prior authorization may be required for  reimbursement for recommended care.  Comment: Rehab Admissions Coordinator to follow up.  Delice Lesch, MD 10/05/2015

## 2015-10-05 NOTE — Progress Notes (Signed)
[facility]                                                                              Patient Demographics  [name], is a 50 y.o. female, DOB - 01/10/1966, [id number]  Admit date - 09/28/2015   Admitting Physician Costin Karlyne Greenspan, MD  Outpatient Primary MD for the patient is [facility], MD  LOS - 2   Chief Complaint  Patient presents with  . Headache       Brief HPI   A pleasant healthy 50 year old African-American female with only known past medical history of intermittent asthma who was admitted to [facility] a few days ago with left arm paresthesia found to be due to left brain mass, further workup which included evaluation by neurology, neurosurgery and ID concluded with vision brain biopsy on 10/03/2015, preliminary results consistent with abscess. ID has placed her on antibiotics. She is currently in neuro ICU for 1 day monitoring then she will be moved to the regular floor. Her possible source is left lower jaw tooth which has been chronically infected, dental surgery has been consulted as well.   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 HIV negative, toxoplasmosis negative, cysticercosis negative, cryptococcus negative. Neurology, neurosurgery and ID following. Blood cultures negative echo stable. - Bedside LP failed, further workup as directed by neurology and neurosurgery.  - Patient underwent excisional brain biopsy by neurosurgeon Dr. Kathyrn Sheriff on 10/03/2015, continue aspirin, preliminary results consistent with abscess -Infectious disease following, on IV cefepime and Flagyl,  defer duration and course to ID.  - Appreciate dental surgery Dr. Dorothyann Gibbs recommendations, tooth extraction planned on Friday 1/20   Palpitations, suspect  related to anxiety, now resolved,  - normal sinus rhythm on EKG. Echo with EF 65-70%, grade 1 chronic and compensated diastolic dysfunction.   Intermittent Asthma  - well controlled, no dyspnea or wheezing   Left lower jaw chronic tooth infection -  Dental surgery consulted, tooth extraction on 1/20 at 7:30 AM.  Code Status: Full code  Family Communication: Discussed in detail with the patient, all imaging results, lab results explained to the patient    Disposition Plan:   Time Spent in minutes   25 minutes  Procedures  2. D echo  Consults    Neurology - Dr. Nicole Kindred  Infectious Disease - Dr. Baxter Flattery  Neurosurgery - Dr. Joya Salm  Dental surgeon Dr. Lawana Chambers  DVT Prophylaxis SCDs  Medications  Scheduled Meds: . aspirin EC  81 mg Oral Daily  . bisacodyl  10 mg Rectal QHS  . ceFEPime (MAXIPIME) IV  2 g Intravenous Q8H  . docusate sodium  200 mg Oral BID  . magnesium citrate  1 Bottle Oral Once  . metronidazole  500 mg Intravenous 4 times per day  . pantoprazole (PROTONIX) IV  40 mg Intravenous QHS  . senna  1 tablet Oral BID   Continuous Infusions: . sodium chloride 75 mL/hr at 10/04/15 1916  PRN Meds:.acetaminophen **OR** acetaminophen, albuterol, ALPRAZolam, HYDROmorphone (DILAUDID) injection, ondansetron, oxyCODONE-acetaminophen, promethazine   Antibiotics   Anti-infectives    Start     Dose/Rate Route Frequency Ordered Stop   10/04/15 0600  metroNIDAZOLE (FLAGYL) IVPB 500 mg     500 mg 100 mL/hr over 60 Minutes Intravenous 4 times per day 10/03/15 2241     10/04/15 0500  vancomycin (VANCOCIN) IVPB 1000 mg/200 mL premix  Status:  Discontinued     1,000 mg 200 mL/hr over 60 Minutes Intravenous Every 8 hours 10/03/15 1926 10/04/15 1350   10/04/15 0400  metroNIDAZOLE (FLAGYL) IVPB 500 mg  Status:  Discontinued     500 mg 100 mL/hr over 60 Minutes Intravenous Every 8 hours 10/03/15 1907 10/03/15 2241   10/03/15 2100  vancomycin (VANCOCIN) 2,000 mg in  sodium chloride 0.9 % 500 mL IVPB     2,000 mg 250 mL/hr over 120 Minutes Intravenous  Once 10/03/15 1926 10/04/15 0258   10/03/15 2030  ceFEPIme (MAXIPIME) 2 g in dextrose 5 % 50 mL IVPB     2 g 100 mL/hr over 30 Minutes Intravenous Every 8 hours 10/03/15 1926     10/03/15 2000  metroNIDAZOLE (FLAGYL) IVPB 1 g     1 g 200 mL/hr over 60 Minutes Intravenous  Once 10/03/15 1907 10/04/15 0006   10/03/15 1817  bacitracin 50,000 Units in sodium chloride irrigation 0.9 % 500 mL irrigation  Status:  Discontinued       As needed 10/03/15 1817 10/03/15 1855   10/03/15 1721  ceFAZolin (ANCEF) 2-3 GM-% IVPB SOLR    Comments:  Loreli Dollar   : cabinet override      10/03/15 1721 10/03/15 1820        Subjective:   [name] was seen and examined today.  Complaining of headache but no focal weakness, no chest pain. Patient denies dizziness, chest pain, shortness of breath, abdominal pain, N/V/D/C, new weakness, numbess, tingling. No acute events overnight.    Objective:   Blood pressure 147/62, pulse 70, temperature 98.7 F (37.1 C), temperature source Oral, resp. rate 16, height 5\' 9"  (1.753 m), weight 113.399 kg (250 lb), last menstrual period 09/27/2011, SpO2 96 %.  Wt Readings from Last 3 Encounters:  09/28/15 113.399 kg (250 lb)  07/26/15 117.935 kg (260 lb)  08/10/13 108.863 kg (240 lb)     Intake/Output Summary (Last 24 hours) at 10/05/15 1231 Last data filed at 10/05/15 0900  Gross per 24 hour  Intake    750 ml  Output   1475 ml  Net   -725 ml    Exam  General: Alert and oriented x 3, NAD  HEENT:  PERRLA, EOMI, Anicteric Sclera, mucous membranes moist, dental caries   Neck: Supple, no JVD, no masses  CVS: S1 S2 auscultated, no rubs, murmurs or gallops. Regular rate and rhythm.  Respiratory: Clear to auscultation bilaterally, no wheezing, rales or rhonchi  Abdomen: Soft, nontender, nondistended, + bowel sounds  Ext: no cyanosis clubbing or edema  Neuro: AAOx3,  Cr N's II- XII. Strength 5/5 upper and lower extremities bilaterally  Skin: No rashes  Psych: Normal affect and demeanor, alert and oriented x3    Data Review   Micro Results Recent Results (from the past 240 hour(s))  Culture, blood (routine x 2)     Status: None   Collection Time: 09/29/15 10:30 PM  Result Value Ref Range Status   Specimen Description BLOOD RIGHT HAND  Final   Special  Requests BOTTLES DRAWN AEROBIC AND ANAEROBIC 6CC  Final   Culture   Final    NO GROWTH 5 DAYS Performed at [facility]    Report Status 10/05/2015 FINAL  Final  Culture, blood (routine x 2)     Status: None   Collection Time: 09/29/15 10:38 PM  Result Value Ref Range Status   Specimen Description BLOOD RIGHT ARM  Final   Special Requests BOTTLES DRAWN AEROBIC AND ANAEROBIC 6CC  Final   Culture   Final    NO GROWTH 5 DAYS Performed at [facility]    Report Status 10/05/2015 FINAL  Final  Surgical pcr screen     Status: Abnormal   Collection Time: 10/03/15  1:22 PM  Result Value Ref Range Status   MRSA, PCR NEGATIVE NEGATIVE Final   Staphylococcus aureus POSITIVE (A) NEGATIVE Final    Comment:        The Xpert SA Assay (FDA approved for NASAL specimens in patients over 85 years of age), is one component of a comprehensive surveillance program.  Test performance has been validated by [facility] for patients greater than or equal to 23 year old. It is not intended to diagnose infection nor to guide or monitor treatment.   Anaerobic culture     Status: None (Preliminary result)   Collection Time: 10/03/15  6:03 PM  Result Value Ref Range Status   Specimen Description WOUND  Final   Special Requests SWAB OF TUMOR RIGHT PARIETAL LESION  Final   Gram Stain   Final    MODERATE WBC PRESENT,BOTH PMN AND MONONUCLEAR NO SQUAMOUS EPITHELIAL CELLS SEEN ABUNDANT GRAM NEGATIVE RODS FEW GRAM POSITIVE COCCI IN PAIRS Gram Stain Report Called to,Read Back By and Verified With:  Gram Stain Report Called to,Read Back By and Verified With: S HOCUTT RN 2133 10/03/15 BY A BROWNING Performed at [facility]    Culture PENDING  Incomplete   Report Status PENDING  Incomplete  Wound culture     Status: None (Preliminary result)   Collection Time: 10/03/15  6:03 PM  Result Value Ref Range Status   Specimen Description WOUND  Final   Special Requests SWAB OF TUMOR RIGHT PARIETAL LESION  Final   Gram Stain   Final    MODERATE WBC PRESENT,BOTH PMN AND MONONUCLEAR NO SQUAMOUS EPITHELIAL CELLS SEEN ABUNDANT GRAM NEGATIVE RODS FEW GRAM POSITIVE COCCI IN PAIRS Performed at [facility]    Culture   Final    Culture reincubated for better growth Performed at [facility]    Report Status PENDING  Incomplete  Gram stain     Status: None   Collection Time: 10/03/15  6:03 PM  Result Value Ref Range Status   Specimen Description WOUND  Final   Special Requests SWAB OF TUMOR RIGHT PARIETAL LESION  Final   Gram Stain   Final    MODERATE WBC PRESENT,BOTH PMN AND MONONUCLEAR ABUNDANT GRAM NEGATIVE RODS FEW GRAM POSITIVE COCCI IN PAIRS Gram Stain Report Called to,Read Back By and Verified With: S HOCUTT RN 2133 10/03/15 A BROWNING    Report Status 10/03/2015 FINAL  Final  Gram stain     Status: None   Collection Time: 10/03/15  6:03 PM  Result Value Ref Range Status   Specimen Description TISSUE  Final   Special Requests TUMOR RIGHT PARIETAL MASS  Final   Gram Stain   Final    FEW WBC PRESENT,BOTH PMN AND MONONUCLEAR NO ORGANISMS SEEN  Report Status 10/03/2015 FINAL  Final  Tissue culture     Status: None (Preliminary result)   Collection Time: 10/03/15  6:07 PM  Result Value Ref Range Status   Specimen Description TISSUE  Final   Special Requests RIGHT PARIETAL MASS  Final   Gram Stain   Final    FEW WBC PRESENT,BOTH PMN AND MONONUCLEAR NO ORGANISMS SEEN Performed at [facility] Performed at [facility]    Culture   Final      NO GROWTH 1 DAY Performed at [facility]    Report Status PENDING  Incomplete    Radiology Reports Dg [place]  10/01/2015  CLINICAL DATA:  Tooth abscess. EXAM: ORTHOPANTOGRAM/PANORAMIC COMPARISON:  None. FINDINGS: Exam demonstrates the crown of a tooth over the left lower most posterior molar region with possible dental caries and without any associated roots. Possible subtle lucency adjacent the root of the left upper most posterior molar tooth. Remaining bones and soft tissues are unremarkable. IMPRESSION: Possible subtle lucency adjacent the root of the left most posterior upper molar tooth suggesting mild periodontal disease. Possible crown of tooth with dental carie over the most posterior left lower molar region without any associated roots. Recommend clinical correlation. Electronically Signed   By: Marin Olp M.D.   On: 10/01/2015 16:08   Ct Head Wo Contrast  09/28/2015  CLINICAL DATA:  Headache.  Left arm numbness. EXAM: CT HEAD WITHOUT CONTRAST TECHNIQUE: Contiguous axial images were obtained from the base of the skull through the vertex without intravenous contrast. COMPARISON:  None. FINDINGS: Skull and Sinuses:Negative for fracture or destructive process. Lobulated mucosal thickening in the maxillary sinuses, likely retention cysts. Large middle concha bullosa on the right with the septal deviation to the left. Visualized orbits: Negative. Brain: There is cortical and subcortical low-density in the high right parietal lobe with local sulcal effacement. Density overlapping the occipital horn of the right lateral ventricle has a linear non layering appearance and may be artifact/volume-averaging. No convincing hemorrhage. No hydrocephalus or shift. These results were called by telephone at the time of interpretation on 09/28/2015 at 4:42 pm to Dr. Bernerd Limbo , who verbally acknowledged these results. IMPRESSION: 1. Right parietal abnormality which could reflect  small infarct or vasogenic edema (for mass or inflammation). Especially given reported deficit, recommend brain MRI with contrast. 2. Indeterminate density at occipital horn right lateral ventricle, attention on follow-up. Electronically Signed   By: Monte Fantasia M.D.   On: 09/28/2015 16:44   Mr Jeri Cos Contrast  10/03/2015  CLINICAL DATA:  50 year old female with ring-enhancing right parietal brain lesion. Stereotactic surgical planning for biopsy. Initial encounter. EXAM: MRI HEAD WITH CONTRAST TECHNIQUE: Multiplanar, multiecho pulse sequences of the brain and surrounding structures were obtained with intravenous contrast. COMPARISON:  Brain MRI and head CT 09/28/2015 CONTRAST:  89mL MULTIHANCE GADOBENATE DIMEGLUMINE 529 MG/ML IV SOLN FINDINGS: Cystic right parietal lesion with rim enhancement has progressed in size over the last 5 days, now measuring up to 31 mm in diameter (previously up to 15 mm). The peripheral rim enhancement has thickened and is more indistinct. Surrounding FLAIR hyper intensity in a vasogenic edema pattern with mild regional mass effect has also progressed. Mild mass effect on the atrium of the right lateral ventricle. Trace associated blood products or mineralization now. The internal portion of the lesion was restricted on diffusion previously and had a dark T2 rim. The abnormal enhancement extends to the posterior superior right parietal lobe surface and there  is now mild associated dural thickening and hyper enhancement (series 4, image 13). No leptomeningeal enhancement. No ventricular enhancement. No ventriculomegaly. No intraventricular debris. No other abnormal intracranial enhancement. No new lesion. No diffusion-weighted imaging today. Basilar cisterns remain patent. IMPRESSION: Solitary right parietal lesion and surrounding edema have roughly doubled in size over the past 5 days. Small area of overlying dural thickening and enhancement now. Mild regional mass effect. Top  differential considerations remain brain abscess, cystic neoplasm or metastasis. Electronically Signed   By: Genevie Ann M.D.   On: 10/03/2015 12:47   Mr Jeri Cos [id number] Contrast  09/28/2015  CLINICAL DATA:  Acute presentation with headache and left arm weakness. Abnormal head CT EXAM: MRI HEAD WITHOUT AND WITH CONTRAST TECHNIQUE: Multiplanar, multiecho pulse sequences of the brain and surrounding structures were obtained without and with intravenous contrast. CONTRAST:  56mL MULTIHANCE GADOBENATE DIMEGLUMINE 529 MG/ML IV SOLN COMPARISON:  Head CT same day FINDINGS: There is a 1 cm lesion in the right parietal cortical/subcortical brain with surrounding edema. This appears to be a round well-circumscribed lesion with thin internal cystic portion. The abnormality shows restricted diffusion. The abnormality shows ring enhancement. 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 This does not represent an infarction. Pyogenic abscess is possible is well. The remainder of the study is normal except for a few insignificant punctate white matter foci in the frontal lobes. No hydrocephalus. No extra-axial collection. No sign of brain hemorrhage. IMPRESSION: 1 cm ring-enhancing lesion in the right parietal cortical and subcortical brain with surrounding vasogenic edema. 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 Necrotic brain metastasis is also possible but least likely. Electronically Signed   By: Nelson Chimes M.D.   On: 09/28/2015 20:19    CBC  Recent Labs Lab 09/28/15 1612 10/05/15 0414  WBC 4.4 7.5  HGB 12.2 11.0*  HCT 36.1 32.3*  PLT 276 257  MCV 95.5 95.3  MCH 32.3 32.4  MCHC 33.8 34.1  RDW 12.2 12.4  LYMPHSABS 1.9  --   MONOABS 0.3  --   EOSABS 0.2  --   BASOSABS 0.0  --     Chemistries   Recent Labs Lab 09/28/15 1612 10/01/15 1201 10/05/15 0414 10/05/15 1010  NA 143 144 141 141  K 3.7 3.8 3.8 3.5  CL 109 110 108  109  CO2 27 26 26 27   GLUCOSE 97 119* 135* 118*  BUN 9 8 7 6   CREATININE 0.74 0.88 0.81 0.73  CALCIUM 8.9 9.5 8.3* 8.5*  AST  --  19  --   --   ALT  --  12*  --   --   ALKPHOS  --  78  --   --   BILITOT  --  0.8  --   --    ------------------------------------------------------------------------------------------------------------------ estimated creatinine clearance is 116.8 mL/min (by C-G formula based on Cr of 0.73). ------------------------------------------------------------------------------------------------------------------ No results for input(s): HGBA1C in the last 72 hours. ------------------------------------------------------------------------------------------------------------------ No results for input(s): CHOL, HDL, LDLCALC, TRIG, CHOLHDL, LDLDIRECT in the last 72 hours. ------------------------------------------------------------------------------------------------------------------ No results for input(s): TSH, T4TOTAL, T3FREE, THYROIDAB in the last 72 hours.  Invalid input(s): FREET3 ------------------------------------------------------------------------------------------------------------------ No results for input(s): VITAMINB12, FOLATE, FERRITIN, TIBC, IRON, RETICCTPCT in the last 72 hours.  Coagulation profile  Recent Labs Lab 09/30/15 0934  INR 1.01    No results for input(s): DDIMER in the last 72 hours.  Cardiac Enzymes No results for input(s): CKMB, TROPONINI, MYOGLOBIN in  the last 168 hours.  Invalid input(s): CK ------------------------------------------------------------------------------------------------------------------ Invalid input(s): POCBNP  No results for input(s): GLUCAP in the last 72 hours.   Hakop Humbarger M.D. [facility] 10/05/2015, 12:31 PM  Pager: [phone] Between 7am to 7pm - call Pager - [phone]  After 7pm go to [email or web address] - password [email or web address]  Call night coverage person covering after 7pm

## 2015-10-05 NOTE — Progress Notes (Signed)
[place] for Infectious Disease   Reason for visit: Follow up on brain abscess  Interval History: no new cultures.  Reports significant headache and vomiting.  No fever, no chills.   Physical Exam: Constitutional:  Filed Vitals:   10/05/15 0925 10/05/15 1346  BP: 147/62 146/74  Pulse:  74  Temp: 98.7 F (37.1 C) 98.8 F (37.1 C)  Resp: 16 17   patient appears in NAD Respiratory: Normal respiratory effort; CTA B Cardiovascular: RRR  Review of Systems: Gastrointestinal: negative for diarrhea Neurological: positive for headaches, negative for weakness  Lab Results  Component Value Date   WBC 7.5 10/05/2015   HGB 11.0* 10/05/2015   HCT 32.3* 10/05/2015   MCV 95.3 10/05/2015   PLT 257 10/05/2015    Lab Results  Component Value Date   CREATININE 0.73 10/05/2015   BUN 6 10/05/2015   NA 141 10/05/2015   K 3.5 10/05/2015   CL 109 10/05/2015   CO2 27 10/05/2015    Lab Results  Component Value Date   ALT 12* 10/01/2015   AST 19 10/01/2015   ALKPHOS 78 10/01/2015     Microbiology: Recent Results (from the past 240 hour(s))  Culture, blood (routine x 2)     Status: None   Collection Time: 09/29/15 10:30 PM  Result Value Ref Range Status   Specimen Description BLOOD RIGHT HAND  Final   Special Requests BOTTLES DRAWN AEROBIC AND ANAEROBIC 6CC  Final   Culture   Final    NO GROWTH 5 DAYS Performed at [facility]    Report Status 10/05/2015 FINAL  Final  Culture, blood (routine x 2)     Status: None   Collection Time: 09/29/15 10:38 PM  Result Value Ref Range Status   Specimen Description BLOOD RIGHT ARM  Final   Special Requests BOTTLES DRAWN AEROBIC AND ANAEROBIC 6CC  Final   Culture   Final    NO GROWTH 5 DAYS Performed at [facility]    Report Status 10/05/2015 FINAL  Final  Surgical pcr screen     Status: Abnormal   Collection Time: 10/03/15  1:22 PM  Result Value Ref Range Status   MRSA, PCR NEGATIVE NEGATIVE Final   Staphylococcus aureus POSITIVE (A) NEGATIVE Final    Comment:        The Xpert SA Assay (FDA approved for NASAL specimens in patients over 43 years of age), is one component of a comprehensive surveillance program.  Test performance has been validated by [facility] for patients greater than or equal to 43 year old. It is not intended to diagnose infection nor to guide or monitor treatment.   Anaerobic culture     Status: None (Preliminary result)   Collection Time: 10/03/15  6:03 PM  Result Value Ref Range Status   Specimen Description WOUND  Final   Special Requests SWAB OF TUMOR RIGHT PARIETAL LESION  Final   Gram Stain   Final    MODERATE WBC PRESENT,BOTH PMN AND MONONUCLEAR NO SQUAMOUS EPITHELIAL CELLS SEEN ABUNDANT GRAM NEGATIVE RODS FEW GRAM POSITIVE COCCI IN PAIRS Gram Stain Report Called to,Read Back By and Verified With: Gram Stain Report Called to,Read Back By and Verified With: S HOCUTT RN 2133 10/03/15 BY A BROWNING Performed at [facility]    Culture   Final    NO ANAEROBES ISOLATED; CULTURE IN PROGRESS FOR 5 DAYS Performed at [facility]    Report Status PENDING  Incomplete  Wound culture     Status: None (Preliminary result)   Collection Time: 10/03/15  6:03 PM  Result Value Ref Range Status   Specimen Description WOUND  Final   Special Requests SWAB OF TUMOR RIGHT PARIETAL LESION  Final   Gram Stain   Final    MODERATE WBC PRESENT,BOTH PMN AND MONONUCLEAR NO SQUAMOUS EPITHELIAL CELLS SEEN ABUNDANT GRAM NEGATIVE RODS FEW GRAM POSITIVE COCCI IN PAIRS Performed at [facility]    Culture   Final    Culture reincubated for better growth Performed at [facility]    Report Status PENDING  Incomplete  Gram stain     Status: None   Collection Time: 10/03/15  6:03 PM  Result Value Ref Range Status   Specimen Description WOUND  Final   Special Requests SWAB OF TUMOR RIGHT PARIETAL LESION  Final   Gram Stain   Final     MODERATE WBC PRESENT,BOTH PMN AND MONONUCLEAR ABUNDANT GRAM NEGATIVE RODS FEW GRAM POSITIVE COCCI IN PAIRS Gram Stain Report Called to,Read Back By and Verified With: S HOCUTT RN 2133 10/03/15 A BROWNING    Report Status 10/03/2015 FINAL  Final  Gram stain     Status: None   Collection Time: 10/03/15  6:03 PM  Result Value Ref Range Status   Specimen Description TISSUE  Final   Special Requests TUMOR RIGHT PARIETAL MASS  Final   Gram Stain   Final    FEW WBC PRESENT,BOTH PMN AND MONONUCLEAR NO ORGANISMS SEEN    Report Status 10/03/2015 FINAL  Final  Tissue culture     Status: None (Preliminary result)   Collection Time: 10/03/15  6:07 PM  Result Value Ref Range Status   Specimen Description TISSUE  Final   Special Requests RIGHT PARIETAL MASS  Final   Gram Stain   Final    FEW WBC PRESENT,BOTH PMN AND MONONUCLEAR NO ORGANISMS SEEN Performed at [facility] Performed at [facility]    Culture   Final    NO GROWTH 1 DAY Performed at [facility]    Report Status PENDING  Incomplete    Impression/Plan:  1. Brain abscess - gram stain noted, on cefepime and flagyl. Waiting for ID.   2. Headache - getting pain control.

## 2015-10-05 NOTE — Progress Notes (Addendum)
No issues overnight. Pt reports HA and nausea earlier today, feeling better now. Also reports some "heaviness" of LUE/LLE. Able to get out of bed today.  EXAM:  BP 144/60 mmHg  Pulse 67  Temp(Src) 98.6 F (37 C) (Oral)  Resp 16  Ht 5\' 9"  (1.753 m)  Wt 113.399 kg (250 lb)  BMI 36.90 kg/m2  SpO2 94%  LMP 09/27/2011  Awake, alert, oriented  Speech fluent, appropriate  CN grossly intact  Slight LUE/LLE weakness  IMPRESSION:  50 y.o. female s/p resection of brain abscess, apparent slightly worsened left weakness today.  PLAN: - will start short course of steroids:  4q6 x 2d  4q12 x 2d  2q12 x 2d  1q12 x 2d - Cont abx per ID

## 2015-10-05 NOTE — Progress Notes (Signed)
PT Cancellation Note  Patient Details Name: [name] MRN: [id number] DOB: [birth year]   Cancelled Treatment:    Reason Eval/Treat Not Completed: Medical issues which prohibited therapy (pt nauseous and requested to return tomorrow)     Laureen Abrahams, PT, DPT 10/05/2015 2:23 PM [phone]

## 2015-10-05 NOTE — Progress Notes (Signed)
Occupational Therapy Treatment Patient Details Name: [name] MRN: [id number] DOB: [birth year] Today's Date: 10/05/2015    History of present illness pt presents post R Frontoparietal Crani with Lesion Biopsy after having headache for 4 days with MRI showing 1cm lesion.  pt with hx of RA.   OT comments  Pt complaining of LUE & LLE feeling "heavy" today. Pt requiring increased time for processing and states she feels she "is in a fog". Pt requiring use of RW to ambulate due to LOB without RW. Improved ability to orient self at  Midline, but continues to demonstrate a R bias. Pt vomited at end of session. Nsg notified. Will continue to follow acutely. At this time, continue to recommend short CIR stay to reach mod I goals with ADL and IADL tasks.  Follow Up Recommendations  CIR;Supervision/Assistance - 24 hour    Equipment Recommendations  3 in 1 bedside comode    Recommendations for Other Services Rehab consult    Precautions / Restrictions Precautions Precautions: Fall       Mobility Bed Mobility Overal bed mobility: Needs Assistance       Supine to sit: Supervision        Transfers Overall transfer level: Needs assistance Equipment used: 1 person hand held assist Transfers: Sit to/from Stand;Stand Pivot Transfers Sit to Stand: Min assist Stand pivot transfers: Min assist       General transfer comment: Pt states she feels unsteady    Balance             Standing balance-Leahy Scale: Poor                     ADL           Upper Body Bathing: Set up;Supervision/ safety;Sitting               Toilet Transfer: Minimal assistance;Ambulation;RW Toilet Transfer Details (indicate cue type and reason): improved safety wtih RW. Difficulty with managing RW Toileting- Clothing Manipulation and Hygiene: Minimal assistance       Functional mobility during ADLs: Minimal assistance;Rolling walker;Cueing for safety        Vision                      Perception  On clock draw activity, pt had difficulty with spatial orientation of numbers on clock. Pt with better performance on 2nd trial. Pt able to accurately complete line bisection. Completed letter cancellation task.   Praxis      Cognition   Behavior During Therapy: Flat affect Overall Cognitive Status: Impaired/Different from baseline          Following Commands: Follows multi-step commands consistently   Awareness: Emergent Problem Solving: Slow processing General Comments: Pt states she feels like she is in a "fog". Continues to appear to demonstrate a R bias    Extremity/Trunk Assessment               Exercises  LUE  parasthesia   Shoulder Instructions       General Comments  Pt tearful at end of session. "something is not right with me"    Pertinent Vitals/ Pain       Pain Assessment: Faces Faces Pain Scale: Hurts little more Pain Location: headache Pain Descriptors / Indicators: Aching Pain Intervention(s): Limited activity within patient's tolerance;Monitored during session  Home Living  Prior Functioning/Environment              Frequency Min 2X/week     Progress Toward Goals  OT Goals(current goals can now be found in the care plan section)  Progress towards OT goals: Progressing toward goals  Acute Rehab OT Goals Patient Stated Goal: to be independent OT Goal Formulation: With patient Time For Goal Achievement: 10/16/15 Potential to Achieve Goals: Good ADL Goals Additional ADL Goal #1: Pt will verbalize understadning of safety issues and use of compensation to accomodate for sensory changes in LUE  Plan Discharge plan remains appropriate;Frequency remains appropriate    Co-evaluation                 End of Session Equipment Utilized During Treatment: Gait belt;Rolling walker   Activity Tolerance Patient tolerated treatment well   Patient Left  in chair;with call bell/phone within reach;with chair alarm set;with family/visitor present   Nurse Communication Mobility status        Time: [id number] OT Time Calculation (min): 23 min  Charges: OT General Charges $OT Visit: 1 Procedure OT Treatments $Therapeutic Activity: 23-37 mins  Ebelin Dillehay,HILLARY 10/05/2015, 1:55 PM

## 2015-10-05 NOTE — Progress Notes (Signed)
Rehab Admissions Coordinator Note:  Patient was screened by Cleatrice Burke for appropriateness for an Inpatient Acute Rehab Consult per PT recommendation.   At this time, we are recommending Inpatient Rehab consult. I will contact Dr. Tana Coast for order.  Cleatrice Burke 10/05/2015, 7:47 AM  I can be reached at [phone].

## 2015-10-06 LAB — WOUND CULTURE

## 2015-10-06 LAB — CBC
HEMATOCRIT: 32.3 % — AB (ref 36.0–46.0)
Hemoglobin: 11.1 g/dL — ABNORMAL LOW (ref 12.0–15.0)
MCH: 32.2 pg (ref 26.0–34.0)
MCHC: 34.4 g/dL (ref 30.0–36.0)
MCV: 93.6 fL (ref 78.0–100.0)
PLATELETS: 272 10*3/uL (ref 150–400)
RBC: 3.45 MIL/uL — AB (ref 3.87–5.11)
RDW: 12.1 % (ref 11.5–15.5)
WBC: 6.8 10*3/uL (ref 4.0–10.5)

## 2015-10-06 MED ORDER — DEXTROSE 5 % IV SOLN
2.0000 g | Freq: Two times a day (BID) | INTRAVENOUS | Status: DC
  Administered 2015-10-06: 2 g via INTRAVENOUS
  Filled 2015-10-06 (×4): qty 2

## 2015-10-06 NOTE — Progress Notes (Signed)
Physical Therapy Treatment Patient Details Name: [name] MRN: [id number] DOB: [birth year] Today's Date: 10/06/2015    History of Present Illness pt presents post R Frontoparietal Crani with Lesion Biopsy after having headache for 4 days with MRI showing 1cm lesion.  Planned tooth extraction 1/20 as ?source of infection/brain abscess. pt with hx of RA.    PT Comments    Patient's balance is improving, however continued inattention of Lt side with frequent running into objects on her left. She can verbalize the strategy she should use (scanning with turning head to her Left), however unable to carryover to task.   Follow Up Recommendations  CIR     Equipment Recommendations  None recommended by PT    Recommendations for Other Services       Precautions / Restrictions Precautions Precautions: Fall Restrictions Weight Bearing Restrictions: No    Mobility  Bed Mobility Overal bed mobility: Needs Assistance Bed Mobility: Supine to Sit     Supine to sit: HOB elevated;Supervision     General bed mobility comments: uses her RUE to assist LLE off eOB  Transfers Overall transfer level: Needs assistance Equipment used: Rolling walker (2 wheeled) Transfers: Sit to/from Stand Sit to Stand: Min assist         General transfer comment: x 3 with max cues for safe use of RW each time; steadying assist  Ambulation/Gait Ambulation/Gait assistance: Min assist Ambulation Distance (Feet): 12 Feet (10, 80 (seated rests between)) Assistive device: Rolling walker (2 wheeled) Gait Pattern/deviations: Step-through pattern;Decreased stride length;Decreased weight shift to right;Drifts right/left   Gait velocity interpretation: Below normal speed for age/gender General Gait Details: pt tends to lean to L side and needs cueing for attending to objects and doorway on L side as pt left her L UE on outside of door frame and was continuing to try to walk through door.     Stairs             Wheelchair Mobility    Modified Rankin (Stroke Patients Only)       Balance     Sitting balance-Leahy Scale: Fair       Standing balance-Leahy Scale: Poor                      Cognition Arousal/Alertness: Awake/alert Behavior During Therapy: Flat affect Overall Cognitive Status: Impaired/Different from baseline Area of Impairment: Attention;Safety/judgement;Awareness;Problem solving   Current Attention Level: Alternating   Following Commands: Follows one step commands with increased time;Follows multi-step commands inconsistently;Follows multi-step commands with increased time Safety/Judgement: Decreased awareness of deficits;Decreased awareness of safety Awareness: Intellectual Problem Solving: Slow processing;Difficulty sequencing;Requires verbal cues;Requires tactile cues General Comments: pt with decreased awareness of L side and running into doorframe or Lt wall with RW consistently throughout session (requires max cues to attend to Lt visual field--keeps head oriented to her Rt).  pt with R sided gaze preference, but can visually attend Bil.      Exercises      General Comments        Pertinent Vitals/Pain Pain Assessment: 0-10 Pain Score: 2  Pain Location: headache Pain Descriptors / Indicators: Aching Pain Intervention(s): Limited activity within patient's tolerance;Repositioned;Monitored during session    Home Living                      Prior Function            PT Goals (current goals can now be found in the care plan  section) Acute Rehab PT Goals Patient Stated Goal: to know what is going on Time For Goal Achievement: 10/18/15 Progress towards PT goals: Progressing toward goals    Frequency  Min 3X/week    PT Plan Current plan remains appropriate    Co-evaluation PT/OT/SLP Co-Evaluation/Treatment: Yes           End of Session Equipment Utilized During Treatment: Gait belt Activity Tolerance: Patient  tolerated treatment well Patient left: in chair;with call bell/phone within reach;with chair alarm set     Time: [id number] PT Time Calculation (min) (ACUTE ONLY): 41 min  Charges:  $Gait Training: 23-37 mins $Therapeutic Activity: 8-22 mins                    G Codes:      [name] 20-Oct-2015, 12:11 PM Pager [phone]

## 2015-10-06 NOTE — Progress Notes (Signed)
Inpatient Rehabilitation  Met with patient at bedside to discuss medical team's recommendation for IP Rehab post acute stay.  Patient asked appropriate questions and is agreeable.  Noted patient to have dental extractions tomorrow at 7:30am.  Could potentially admit late tomorrow or Saturday, when medically ready.  Please call with questions.  Carmelia Roller., CCC/SLP Admission Coordinator  [place]  Cell [phone]

## 2015-10-06 NOTE — H&P (Signed)
Physical Medicine and Rehabilitation Admission H&P    Chief Complaint  Patient presents with  . Headache  : HPI: [name] is a 50 y.o. right handed female with history of asthma as well as recent tooth infection. Patient lives alone with 2 young children and independent prior to admission. She plans to stay with her sister on discharge who works day shift as a Radio producer, but notes she has another sister who will stay with her during the day. One level home with one-step entry. Presented 10/02/2015 with sudden onset of headache and left-sided paresthesias and mild weakness. CT and MRI of the brain demonstrated a ring-enhancing right postcentral lesions suspicious for abscess.  Underwent stereotactic right frontoparietal craniotomy for resection of lesion 10/03/2015 per Dr. Kathyrn Sheriff. Decadron protocol and taper. Gram stain with gram-negative rods but also GPC in pairs. Infectious disease consulted and presently on Rocephin 6 weeks and Flagyl 500 mg 4 times a day. He underwent tooth extraction per dental surgery 10/07/2015 for recent tooth infection. Tolerating a regular diet. Physical and occupational therapy evaluation completed 10/04/2015 with recommendations of physical medicine rehabilitation consult. Patient was admitted for comprehensive rehabilitation program   ROS Constitutional: Negative for fever and chills.  HENT: Negative for hearing loss.  Eyes: Negative for blurred vision and double vision.  Respiratory: Negative for cough and shortness of breath.  Cardiovascular: Negative for chest pain, palpitations and leg swelling.  Gastrointestinal: Positive for constipation. Negative for nausea and vomiting.  Genitourinary: Negative for dysuria and hematuria.  Musculoskeletal: Positive for myalgias.  Skin: Negative for rash.  Neurological: Positive for dizziness, weakness and headaches. Negative for seizures.  All other systems reviewed and are negative   Past Medical History   Diagnosis Date  . Asthma   . Rotator cuff tear   . Perimenopausal   . Slipped intervertebral disc   . Meniscus tear   . Chronic knee pain     right  . Rheumatoid arthritis [facility])    Past Surgical History  Procedure Laterality Date  . Cesarean section    . Oophorectomy    . Tubal ligation    . Shoulder surgery    . Craniotomy Right 10/03/2015    Procedure: Stereotactic right frontoparietal craniotomy for excisional biopsy of lesion ;  Surgeon: Consuella Lose, MD;  Location: [place] [facility]UR[facility] ORS;  Service: Neurosurgery;  Laterality: Right;  . Application of cranial navigation Right 10/03/2015    Procedure: APPLICATION OF CRANIAL NAVIGATION;  Surgeon: Consuella Lose, MD;  Location: [place] NEURO ORS;  Service: Neurosurgery;  Laterality: Right;   Family History  Problem Relation Age of Onset  . Cancer Mother     colon cancer  . Cancer Father     brain cancer  . Alcohol abuse Father   . Diabetes Father    Social History:  reports that she has never smoked. She has never used smokeless tobacco. She reports that she does not drink alcohol or use illicit drugs. Allergies:  Allergies  Allergen Reactions  . Diclofenac Sodium Itching  . Meloxicam Itching   Medications Prior to Admission  Medication Sig Dispense Refill  . albuterol (PROVENTIL HFA;VENTOLIN HFA) 108 (90 BASE) MCG/ACT inhaler Inhale 2 puffs into the lungs every 6 (six) hours as needed for wheezing or shortness of breath. For wheezing    . cholecalciferol (VITAMIN D) 1000 UNITS tablet Take 1,000 Units by mouth daily with breakfast.    . cyclobenzaprine (FLEXERIL) 10 MG tablet Take 1 tablet (10 mg total) by mouth  2 (two) times daily as needed for muscle spasms. 20 tablet 0  . erythromycin ophthalmic ointment Place 1 application into the right eye daily.    . methocarbamol (ROBAXIN) 500 MG tablet Take 1 tablet (500 mg total) by mouth 2 (two) times daily. 20 tablet 0  . Multiple Vitamin (MULTIVITAMIN WITH MINERALS) TABS tablet Take  1 tablet by mouth daily.    . naproxen (NAPROSYN) 500 MG tablet Take 1 tablet (500 mg total) by mouth 2 (two) times daily. 30 tablet 0  . naproxen sodium (ANAPROX) 220 MG tablet Take 440 mg by mouth 2 (two) times daily as needed (pain).    Marland Kitchen oxyCODONE-acetaminophen (PERCOCET) 5-325 MG per tablet Take 1-2 tablets by mouth every 4 (four) hours as needed for severe pain. 20 tablet 0  . sodium chloride (MURO 128) 2 % ophthalmic solution Place 1 drop into the right eye 2 (two) times daily.    . sodium chloride (MURO 128) 5 % ophthalmic ointment Place 1 application into the right eye at bedtime.    . traMADol (ULTRAM) 50 MG tablet Take 1 tablet (50 mg total) by mouth every 6 (six) hours as needed. 15 tablet 0    Home: Home Living Family/patient expects to be discharged to:: Private residence Living Arrangements: Children, Other relatives Available Help at Discharge: Family, Available 24 hours/day Type of Home: House Home Access: Stairs to enter [employer] of Steps: 1 Home Layout: One level Alternate Level Stairs-Number of Steps: flight Bathroom Shower/Tub: Tub/shower unit, Multimedia programmer: Programmer, systems: Yes Home Equipment: None   Functional History: Prior Function Level of Independence: Independent Comments: works  Functional Status:  Mobility: Bed Mobility Overal bed mobility: Needs Assistance Bed Mobility: Supine to Sit Supine to sit: Supervision Sit to supine:  (pt on BSC with RN on PT arrival) General bed mobility comments: A with L LE. Transfers Overall transfer level: Needs assistance Equipment used: 1 person hand held assist Transfers: Sit to/from Stand, Stand Pivot Transfers Sit to Stand: Min assist Stand pivot transfers: Min assist General transfer comment: Pt states she feels unsteady Ambulation/Gait Ambulation/Gait assistance: Min assist, +2 physical assistance Ambulation Distance (Feet): 12 Feet (x2) Assistive device:  2 person hand held assist Gait Pattern/deviations: Step-to pattern, Decreased step length - left, Decreased stance time - left, Decreased dorsiflexion - left, Shuffle, Drifts right/left General Gait Details: pt tends to lean to L side and needs cueing for attending to objects and doorway on L side as pt left her L UE on outside of door frame and was continuing to try to walk through door.      ADL: ADL Overall ADL's : Needs assistance/impaired Eating/Feeding: Set up Grooming: Set up Upper Body Bathing: Set up, Supervision/ safety, Sitting Lower Body Bathing: Moderate assistance Upper Body Dressing : Minimal assistance Lower Body Dressing: Moderate assistance Toilet Transfer: Minimal assistance, Ambulation, RW Toilet Transfer Details (indicate cue type and reason): improved safety wtih RW. Difficulty with managing RW Toileting- Clothing Manipulation and Hygiene: Minimal assistance Toileting - Clothing Manipulation Details (indicate cue type and reason): foley Functional mobility during ADLs: Minimal assistance, Rolling walker, Cueing for safety General ADL Comments: Pt ambulating to bathroom. Running into L doorframe of bathroom. leaving LUE outside of bathroom adn required phusical cues to bring LUE into bathroom  Cognition: Cognition Overall Cognitive Status: Impaired/Different from baseline Orientation Level: Oriented X4 Cognition Arousal/Alertness: Awake/alert Behavior During Therapy: Flat affect Overall Cognitive Status: Impaired/Different from baseline Area of Impairment: Attention, Safety/judgement, Awareness, Problem  solving Current Attention Level: Alternating Following Commands: Follows multi-step commands consistently Safety/Judgement: Decreased awareness of deficits Awareness: Emergent Problem Solving: Slow processing General Comments: Pt states she feels like she is in a "fog". Continues to appear to demonstrate a R bias  Physical Exam: Blood pressure 125/50, pulse  78, temperature 98.4 F (36.9 C), temperature source Oral, resp. rate 16, height 5' 9"  (1.753 m), weight 113.399 kg (250 lb), last menstrual period 09/27/2011, SpO2 93 %. Physical Exam Constitutional: She is oriented to person, place, and time. She appears well-developed and well-nourished.  HENT: #18 site s/p extraction. No bleeding or abnormal color noted, mild tenderness Head: Normocephalic. Crani incision covered.  Right Ear: External ear normal.  Left Ear: External ear normal.  Eyes: Conjunctivae and EOM are normal.  Neck: Normal range of motion. Neck supple. No thyromegaly present.  Cardiovascular: Normal rate and regular rhythm. No murmurs, rubs, or gallops Respiratory: Effort normal and breath sounds normal. No respiratory distress.  GI: Soft. Bowel sounds are normal. She exhibits no distension.  Musculoskeletal:  PROM No tenderness in extremities  Neurological: She is alert and oriented to person, place, and time. Seems to have good insight and awareness, ?slightly disinhibited Follows full commands Sensation 1/2 LUE and LLE, perhaps more diminished distal left leg Motor: R UE/RLE: 5/5 proximal distal L UE: 3- to 3 delt, 3/5 bicep,tricep, wrist, HI.  LLE: HF 2, KE 2+, ADF/APF 3.  DTRs increased on left side versus right-sided  Skin: Skin is warm and dry.  Psychiatric: Her behavior is normal, pleasant, a little disinhibited. Judgment normal.      Results for orders placed or performed during the hospital encounter of 09/28/15 (from the past 48 hour(s))  CBC     Status: Abnormal   Collection Time: 10/05/15  4:14 AM  Result Value Ref Range   WBC 7.5 4.0 - 10.5 K/uL   RBC 3.39 (L) 3.87 - 5.11 MIL/uL   Hemoglobin 11.0 (L) 12.0 - 15.0 g/dL   HCT 32.3 (L) 36.0 - 46.0 %   MCV 95.3 78.0 - 100.0 fL   MCH 32.4 26.0 - 34.0 pg   MCHC 34.1 30.0 - 36.0 g/dL   RDW 12.4 11.5 - 15.5 %   Platelets 257 150 - 400 K/uL  Basic metabolic panel     Status: Abnormal   Collection Time:  10/05/15  4:14 AM  Result Value Ref Range   Sodium 141 135 - 145 mmol/L   Potassium 3.8 3.5 - 5.1 mmol/L   Chloride 108 101 - 111 mmol/L   CO2 26 22 - 32 mmol/L   Glucose, Bld 135 (H) 65 - 99 mg/dL   BUN 7 6 - 20 mg/dL   Creatinine, Ser 0.81 0.44 - 1.00 mg/dL   Calcium 8.3 (L) 8.9 - 10.3 mg/dL   GFR calc non Af Amer >60 >60 mL/min   GFR calc Af Amer >60 >60 mL/min    Comment: (NOTE) The eGFR has been calculated using the CKD EPI equation. This calculation has not been validated in all clinical situations. eGFR's persistently <60 mL/min signify possible Chronic Kidney Disease.    Anion gap 7 5 - 15  Basic metabolic panel     Status: Abnormal   Collection Time: 10/05/15 10:10 AM  Result Value Ref Range   Sodium 141 135 - 145 mmol/L   Potassium 3.5 3.5 - 5.1 mmol/L   Chloride 109 101 - 111 mmol/L   CO2 27 22 - 32 mmol/L   Glucose,  Bld 118 (H) 65 - 99 mg/dL   BUN 6 6 - 20 mg/dL   Creatinine, Ser 0.73 0.44 - 1.00 mg/dL   Calcium 8.5 (L) 8.9 - 10.3 mg/dL   GFR calc non Af Amer >60 >60 mL/min   GFR calc Af Amer >60 >60 mL/min    Comment: (NOTE) The eGFR has been calculated using the CKD EPI equation. This calculation has not been validated in all clinical situations. eGFR's persistently <60 mL/min signify possible Chronic Kidney Disease.    Anion gap 5 5 - 15  CBC     Status: Abnormal   Collection Time: 10/06/15  3:32 AM  Result Value Ref Range   WBC 6.8 4.0 - 10.5 K/uL   RBC 3.45 (L) 3.87 - 5.11 MIL/uL   Hemoglobin 11.1 (L) 12.0 - 15.0 g/dL   HCT 32.3 (L) 36.0 - 46.0 %   MCV 93.6 78.0 - 100.0 fL   MCH 32.2 26.0 - 34.0 pg   MCHC 34.4 30.0 - 36.0 g/dL   RDW 12.1 11.5 - 15.5 %   Platelets 272 150 - 400 K/uL   No results found.     Medical Problem List and Plan: 1.  Left-sided weakness and headache secondary to brain abscess likely secondary to dental caries status post tooth extraction 2.  DVT Prophylaxis/Anticoagulation: SCDs. Monitor for any signs of DVT 3. Pain  Management: Percocet as needed. Monitor with increased activity in therapy 4. Mood: Xanax 0.25 mg 3 times a day as needed. Appears positive at this time. 5. Neuropsych: This patient is capable of making decisions on her own behalf. 6. Skin/Wound Care: Routine skin checks. Remove incision "cover" from head 7. Fluids/Electrolytes/Nutrition: Routine I&O with follow-up chemistries ordered.  -encourage adequate PO intake 8. ID. Gram stain with gram-negative rods/GPC in pairs.Flagyl 500 mg QID. Ceftriaxone 6 weeks 9. Tooth extraction today, #18. Minimal pain. Advance diet as tolerated. Oral cavity and extraction site looks clean/healthy 10. Constipation. Laxative assistance    Post Admission Physician Evaluation: 1. Functional deficits secondary  to brain abscess. 2. Patient is admitted to receive collaborative, interdisciplinary care between the physiatrist, rehab nursing staff, and therapy team. 3. Patient's level of medical complexity and substantial therapy needs in context of that medical necessity cannot be provided at a lesser intensity of care such as a SNF. 4. Patient has experienced substantial functional loss from his/her baseline which was documented above under the "Functional History" and "Functional Status" headings.  Judging by the patient's diagnosis, physical exam, and functional history, the patient has potential for functional progress which will result in measurable gains while on inpatient rehab.  These gains will be of substantial and practical use upon discharge  in facilitating mobility and self-care at the household level. 5. Physiatrist will provide 24 hour management of medical needs as well as oversight of the therapy plan/treatment and provide guidance as appropriate regarding the interaction of the two. 6. 24 hour rehab nursing will assist with bladder management, bowel management, safety, skin/wound care, disease management, medication administration, pain management and  patient education  and help integrate therapy concepts, techniques,education, etc. 7. PT will assess and treat for/with: Lower extremity strength, range of motion, stamina, balance, functional mobility, safety, adaptive techniques and equipment, NMR, visual-spatial awareness, pain mgt, orthotics, community reintegration.   Goals are: mod I. 8. OT will assess and treat for/with: ADL's, functional mobility, safety, upper extremity strength, adaptive techniques and equipment, NMR, pain mgt, community reintegration, family and patient education.   Goals  are: mod I. Therapy may proceed with showering this patient. 9. SLP will assess and treat for/with: cognition, communication.  Goals are: mod I. 10. Case Management and Social Worker will assess and treat for psychological issues and discharge planning. 11. Team conference will be held weekly to assess progress toward goals and to determine barriers to discharge. 12. Patient will receive at least 3 hours of therapy per day at least 5 days per week. 13. ELOS: 7-10 days   14. Prognosis:  excellent     Meredith Staggers, MD, [facility] 10/07/2015   10/06/2015

## 2015-10-06 NOTE — Care Management Note (Signed)
Case Management Note  Patient Details  Name: [name] MRN: [id number] Date of Birth: Dec 20, 1965  Subjective/Objective: Crani with Resection for parietal Abscess 10/03/15. Independent prior to admission.                     Action/Plan: Melissa,  IP Coordinator following for CIR admission. His been seen Initially and f/u  by CIR MD. Possible admit Saturday if medically cleared (pain, bleeding) following Teeth extraction 10/07/2015. CM will continue to follow.     Expected Discharge Date:   (unknown)               Expected Discharge Plan:  [place]  In-House Referral:     Discharge planning Services  CM Consult  Post Acute Care Choice:  IP Rehab Choice offered to:     DME Arranged:    DME Agency:     HH Arranged:    HH Agency:     Status of Service:  In process, will continue to follow  Medicare Important Message Given:    Date Medicare IM Given:    Medicare IM give by:    Date Additional Medicare IM Given:    Additional Medicare Important Message give by:     If discussed at [place] of Stay Meetings, dates discussed:    Additional Comments:  Delrae Sawyers, RN 10/06/2015, 10:22 AM

## 2015-10-06 NOTE — Progress Notes (Signed)
[facility]                                                                              Patient Demographics  [name], is a 50 y.o. female, DOB - 1965/11/29, [id number]  Admit date - 09/28/2015   Admitting Physician Costin Karlyne Greenspan, MD  Outpatient Primary MD for the patient is [facility], [place]  LOS - 3   Chief Complaint  Patient presents with  . Headache       Brief HPI   A pleasant healthy 50 year old African-American female with only known past medical history of intermittent asthma who was admitted to [facility] a few days ago with left arm paresthesia found to be due to left brain mass, further workup which included evaluation by neurology, neurosurgery and ID concluded with vision brain biopsy on 10/03/2015, preliminary results consistent with abscess. ID has placed her on antibiotics. She is currently in neuro ICU for 1 day monitoring then she will be moved to the regular floor. Her possible source is left lower jaw tooth which has been chronically infected, dental surgery has been consulted as well.   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 HIV negative, toxoplasmosis negative, cysticercosis negative, cryptococcus negative. Neurology, neurosurgery and ID following. Blood cultures negative echo stable. - Bedside LP failed, further workup as directed by neurology and neurosurgery.  - Patient underwent excisional brain biopsy by neurosurgeon Dr. Kathyrn Sheriff on 10/03/2015, continue aspirin, preliminary results consistent with abscess -Infectious disease following, on IV cefepime and Flagyl,  defer duration and course to ID.  - Appreciate dental surgery Dr. Dorothyann Gibbs recommendations, tooth extraction planned on Friday 1/20, NPO after MN     Palpitations, suspect related to anxiety, now resolved,  - normal sinus rhythm on EKG. Echo with EF 65-70%, grade 1 chronic and compensated diastolic dysfunction.   Intermittent Asthma  - well controlled, no dyspnea or wheezing   Left lower jaw chronic tooth infection -  Dental surgery consulted, tooth extraction on 1/20 at 7:30 AM.  Code Status: Full code  Family Communication: Discussed in detail with the patient, all imaging results, lab results explained to the patient    Disposition Plan:   Time Spent in minutes   25 minutes  Procedures  2. D echo  Consults    Neurology - Dr. Nicole Kindred  Infectious Disease - Dr. Baxter Flattery  Neurosurgery - Dr. Joya Salm  Dental surgeon Dr. Lawana Chambers  DVT Prophylaxis SCDs  Medications  Scheduled Meds: . aspirin EC  81 mg Oral Daily  . bisacodyl  10 mg Rectal QHS  . ceFEPime (MAXIPIME) IV  2 g Intravenous Q8H  . dexamethasone  4 mg Intravenous 4 times per day  . docusate sodium  200 mg Oral BID  . metronidazole  500 mg Intravenous 4 times per day  . pantoprazole  40 mg Oral QHS  . senna  1 tablet Oral BID   Continuous Infusions:   PRN Meds:.acetaminophen **OR**  acetaminophen, albuterol, ALPRAZolam, HYDROmorphone (DILAUDID) injection, ondansetron, oxyCODONE-acetaminophen, promethazine   Antibiotics   Anti-infectives    Start     Dose/Rate Route Frequency Ordered Stop   10/04/15 0600  metroNIDAZOLE (FLAGYL) IVPB 500 mg     500 mg 100 mL/hr over 60 Minutes Intravenous 4 times per day 10/03/15 2241     10/04/15 0500  vancomycin (VANCOCIN) IVPB 1000 mg/200 mL premix  Status:  Discontinued     1,000 mg 200 mL/hr over 60 Minutes Intravenous Every 8 hours 10/03/15 1926 10/04/15 1350   10/04/15 0400  metroNIDAZOLE (FLAGYL) IVPB 500 mg  Status:  Discontinued     500 mg 100 mL/hr over 60 Minutes Intravenous Every 8 hours 10/03/15 1907 10/03/15 2241   10/03/15 2100  vancomycin (VANCOCIN) 2,000 mg in sodium chloride 0.9 % 500 mL IVPB      2,000 mg 250 mL/hr over 120 Minutes Intravenous  Once 10/03/15 1926 10/04/15 0258   10/03/15 2030  ceFEPIme (MAXIPIME) 2 g in dextrose 5 % 50 mL IVPB     2 g 100 mL/hr over 30 Minutes Intravenous Every 8 hours 10/03/15 1926     10/03/15 2000  metroNIDAZOLE (FLAGYL) IVPB 1 g     1 g 200 mL/hr over 60 Minutes Intravenous  Once 10/03/15 1907 10/04/15 0006   10/03/15 1817  bacitracin 50,000 Units in sodium chloride irrigation 0.9 % 500 mL irrigation  Status:  Discontinued       As needed 10/03/15 1817 10/03/15 1855   10/03/15 1721  ceFAZolin (ANCEF) 2-3 GM-% IVPB SOLR    Comments:  Loreli Dollar   : cabinet override      10/03/15 1721 10/03/15 1820        Subjective:   [name] was seen and examined today. Feeling tired, otherwise no complaints.  Patient denies dizziness, chest pain, shortness of breath, abdominal pain, N/V/D/C, new weakness, numbess, tingling. No acute events overnight.    Objective:   Blood pressure 126/57, pulse 75, temperature 98.7 F (37.1 C), temperature source Oral, resp. rate 20, height 5\' 9"  (1.753 m), weight 113.399 kg (250 lb), last menstrual period 09/27/2011, SpO2 96 %.  Wt Readings from Last 3 Encounters:  09/28/15 113.399 kg (250 lb)  07/26/15 117.935 kg (260 lb)  08/10/13 108.863 kg (240 lb)    No intake or output data in the 24 hours ending 10/06/15 1158  Exam  General: Alert and oriented x 3, NAD  HEENT:  PERRLA, EOMI, dental caries   Neck: Supple, no JVD, no masses  CVS: S1 S2 clear  Respiratory: CTAB  Abdomen: Soft, nontender, nondistended, + bowel sounds  Ext: no cyanosis clubbing or edema  Neuro: no new deficits  Skin: No rashes  Psych: Normal affect and demeanor, alert and oriented x3    Data Review   Micro Results Recent Results (from the past 240 hour(s))  Culture, blood (routine x 2)     Status: None   Collection Time: 09/29/15 10:30 PM  Result Value Ref Range Status   Specimen Description BLOOD RIGHT HAND   Final   Special Requests BOTTLES DRAWN AEROBIC AND ANAEROBIC [place]  Final   Culture   Final    NO GROWTH 5 DAYS Performed at [facility]    Report Status 10/05/2015 FINAL  Final  Culture, blood (routine x 2)     Status: None   Collection Time: 09/29/15 10:38 PM  Result Value Ref Range Status   Specimen Description BLOOD RIGHT ARM  Final   Special Requests BOTTLES DRAWN AEROBIC AND ANAEROBIC 6CC  Final   Culture   Final    NO GROWTH 5 DAYS Performed at [facility]    Report Status 10/05/2015 FINAL  Final  Surgical pcr screen     Status: Abnormal   Collection Time: 10/03/15  1:22 PM  Result Value Ref Range Status   MRSA, PCR NEGATIVE NEGATIVE Final   Staphylococcus aureus POSITIVE (A) NEGATIVE Final    Comment:        The Xpert SA Assay (FDA approved for NASAL specimens in patients over 36 years of age), is one component of a comprehensive surveillance program.  Test performance has been validated by [facility] for patients greater than or equal to 83 year old. It is not intended to diagnose infection nor to guide or monitor treatment.   Anaerobic culture     Status: None (Preliminary result)   Collection Time: 10/03/15  6:03 PM  Result Value Ref Range Status   Specimen Description WOUND  Final   Special Requests SWAB OF TUMOR RIGHT PARIETAL LESION  Final   Gram Stain   Final    MODERATE WBC PRESENT,BOTH PMN AND MONONUCLEAR NO SQUAMOUS EPITHELIAL CELLS SEEN ABUNDANT GRAM NEGATIVE RODS FEW GRAM POSITIVE COCCI IN PAIRS Gram Stain Report Called to,Read Back By and Verified With: Gram Stain Report Called to,Read Back By and Verified With: S HOCUTT RN [id number] 10/03/15 BY A BROWNING Performed at [facility]    Culture   Final    NO ANAEROBES ISOLATED; CULTURE IN PROGRESS FOR 5 DAYS Performed at [facility]    Report Status PENDING  Incomplete  Wound culture     Status: None (Preliminary result)   Collection Time: 10/03/15  6:03 PM  Result  Value Ref Range Status   Specimen Description WOUND  Final   Special Requests SWAB OF TUMOR RIGHT PARIETAL LESION  Final   Gram Stain   Final    MODERATE WBC PRESENT,BOTH PMN AND MONONUCLEAR NO SQUAMOUS EPITHELIAL CELLS SEEN ABUNDANT GRAM NEGATIVE RODS FEW GRAM POSITIVE COCCI IN PAIRS Performed at [facility]    Culture   Final    Culture reincubated for better growth Performed at [facility]    Report Status PENDING  Incomplete  Gram stain     Status: None   Collection Time: 10/03/15  6:03 PM  Result Value Ref Range Status   Specimen Description WOUND  Final   Special Requests SWAB OF TUMOR RIGHT PARIETAL LESION  Final   Gram Stain   Final    MODERATE WBC PRESENT,BOTH PMN AND MONONUCLEAR ABUNDANT GRAM NEGATIVE RODS FEW GRAM POSITIVE COCCI IN PAIRS Gram Stain Report Called to,Read Back By and Verified With: S HOCUTT RN 2133 10/03/15 A BROWNING    Report Status 10/03/2015 FINAL  Final  Gram stain     Status: None   Collection Time: 10/03/15  6:03 PM  Result Value Ref Range Status   Specimen Description TISSUE  Final   Special Requests TUMOR RIGHT PARIETAL MASS  Final   Gram Stain   Final    FEW WBC PRESENT,BOTH PMN AND MONONUCLEAR NO ORGANISMS SEEN    Report Status 10/03/2015 FINAL  Final  Tissue culture     Status: None (Preliminary result)   Collection Time: 10/03/15  6:07 PM  Result Value Ref Range Status   Specimen Description TISSUE  Final   Special Requests RIGHT PARIETAL MASS  Final  Gram Stain   Final    FEW WBC PRESENT,BOTH PMN AND MONONUCLEAR NO ORGANISMS SEEN Performed at [facility] Performed at [place]   Final    NO GROWTH 1 DAY Performed at [facility]    Report Status PENDING  Incomplete    Radiology Reports Dg [place]  10/01/2015  CLINICAL DATA:  Tooth abscess. EXAM: ORTHOPANTOGRAM/PANORAMIC COMPARISON:  None. FINDINGS: Exam demonstrates the crown of a tooth over the left lower  most posterior molar region with possible dental caries and without any associated roots. Possible subtle lucency adjacent the root of the left upper most posterior molar tooth. Remaining bones and soft tissues are unremarkable. IMPRESSION: Possible subtle lucency adjacent the root of the left most posterior upper molar tooth suggesting mild periodontal disease. Possible crown of tooth with dental carie over the most posterior left lower molar region without any associated roots. Recommend clinical correlation. Electronically Signed   By: Marin Olp M.D.   On: 10/01/2015 16:08   Ct Head Wo Contrast  09/28/2015  CLINICAL DATA:  Headache.  Left arm numbness. EXAM: CT HEAD WITHOUT CONTRAST TECHNIQUE: Contiguous axial images were obtained from the base of the skull through the vertex without intravenous contrast. COMPARISON:  None. FINDINGS: Skull and Sinuses:Negative for fracture or destructive process. Lobulated mucosal thickening in the maxillary sinuses, likely retention cysts. Large middle concha bullosa on the right with the septal deviation to the left. Visualized orbits: Negative. Brain: There is cortical and subcortical low-density in the high right parietal lobe with local sulcal effacement. Density overlapping the occipital horn of the right lateral ventricle has a linear non layering appearance and may be artifact/volume-averaging. No convincing hemorrhage. No hydrocephalus or shift. These results were called by telephone at the time of interpretation on 09/28/2015 at 4:42 pm to Dr. Bernerd Limbo , who verbally acknowledged these results. IMPRESSION: 1. Right parietal abnormality which could reflect small infarct or vasogenic edema (for mass or inflammation). Especially given reported deficit, recommend brain MRI with contrast. 2. Indeterminate density at occipital horn right lateral ventricle, attention on follow-up. Electronically Signed   By: Monte Fantasia M.D.   On: 09/28/2015 16:44   Mr  Jeri Cos Contrast  10/03/2015  CLINICAL DATA:  50 year old female with ring-enhancing right parietal brain lesion. Stereotactic surgical planning for biopsy. Initial encounter. EXAM: MRI HEAD WITH CONTRAST TECHNIQUE: Multiplanar, multiecho pulse sequences of the brain and surrounding structures were obtained with intravenous contrast. COMPARISON:  Brain MRI and head CT 09/28/2015 CONTRAST:  31mL MULTIHANCE GADOBENATE DIMEGLUMINE 529 MG/ML IV SOLN FINDINGS: Cystic right parietal lesion with rim enhancement has progressed in size over the last 5 days, now measuring up to 31 mm in diameter (previously up to 15 mm). The peripheral rim enhancement has thickened and is more indistinct. Surrounding FLAIR hyper intensity in a vasogenic edema pattern with mild regional mass effect has also progressed. Mild mass effect on the atrium of the right lateral ventricle. Trace associated blood products or mineralization now. The internal portion of the lesion was restricted on diffusion previously and had a dark T2 rim. The abnormal enhancement extends to the posterior superior right parietal lobe surface and there is now mild associated dural thickening and hyper enhancement (series 4, image 13). No leptomeningeal enhancement. No ventricular enhancement. No ventriculomegaly. No intraventricular debris. No other abnormal intracranial enhancement. No new lesion. No diffusion-weighted imaging today. Basilar cisterns remain patent. IMPRESSION: Solitary right parietal lesion and surrounding edema have roughly  doubled in size over the past 5 days. Small area of overlying dural thickening and enhancement now. Mild regional mass effect. Top differential considerations remain brain abscess, cystic neoplasm or metastasis. Electronically Signed   By: Genevie Ann M.D.   On: 10/03/2015 12:47   Mr Jeri Cos [id number] Contrast  09/28/2015  CLINICAL DATA:  Acute presentation with headache and left arm weakness. Abnormal head CT EXAM: MRI HEAD WITHOUT AND  WITH CONTRAST TECHNIQUE: Multiplanar, multiecho pulse sequences of the brain and surrounding structures were obtained without and with intravenous contrast. CONTRAST:  56mL MULTIHANCE GADOBENATE DIMEGLUMINE 529 MG/ML IV SOLN COMPARISON:  Head CT same day FINDINGS: There is a 1 cm lesion in the right parietal cortical/subcortical brain with surrounding edema. This appears to be a round well-circumscribed lesion with thin internal cystic portion. The abnormality shows restricted diffusion. The abnormality shows ring enhancement. 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 This does not represent an infarction. Pyogenic abscess is possible is well. The remainder of the study is normal except for a few insignificant punctate white matter foci in the frontal lobes. No hydrocephalus. No extra-axial collection. No sign of brain hemorrhage. IMPRESSION: 1 cm ring-enhancing lesion in the right parietal cortical and subcortical brain with surrounding vasogenic edema. 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 Necrotic brain metastasis is also possible but least likely. Electronically Signed   By: Nelson Chimes M.D.   On: 09/28/2015 20:19    CBC  Recent Labs Lab 10/05/15 0414 10/06/15 0332  WBC 7.5 6.8  HGB 11.0* 11.1*  HCT 32.3* 32.3*  PLT 257 272  MCV 95.3 93.6  MCH 32.4 32.2  MCHC 34.1 34.4  RDW 12.4 12.1    Chemistries   Recent Labs Lab 10/01/15 1201 10/05/15 0414 10/05/15 1010  NA 144 141 141  K 3.8 3.8 3.5  CL 110 108 109  CO2 26 26 27   GLUCOSE 119* 135* 118*  BUN 8 7 6   CREATININE 0.88 0.81 0.73  CALCIUM 9.5 8.3* 8.5*  AST 19  --   --   ALT 12*  --   --   ALKPHOS 78  --   --   BILITOT 0.8  --   --    ------------------------------------------------------------------------------------------------------------------ estimated creatinine clearance is 116.8 mL/min (by C-G formula based on Cr of  0.73). ------------------------------------------------------------------------------------------------------------------ No results for input(s): HGBA1C in the last 72 hours. ------------------------------------------------------------------------------------------------------------------ No results for input(s): CHOL, HDL, LDLCALC, TRIG, CHOLHDL, LDLDIRECT in the last 72 hours. ------------------------------------------------------------------------------------------------------------------ No results for input(s): TSH, T4TOTAL, T3FREE, THYROIDAB in the last 72 hours.  Invalid input(s): FREET3 ------------------------------------------------------------------------------------------------------------------ No results for input(s): VITAMINB12, FOLATE, FERRITIN, TIBC, IRON, RETICCTPCT in the last 72 hours.  Coagulation profile  Recent Labs Lab 09/30/15 0934  INR 1.01    No results for input(s): DDIMER in the last 72 hours.  Cardiac Enzymes No results for input(s): CKMB, TROPONINI, MYOGLOBIN in the last 168 hours.  Invalid input(s): CK ------------------------------------------------------------------------------------------------------------------ Invalid input(s): POCBNP  No results for input(s): GLUCAP in the last 72 hours.   RAI,RIPUDEEP M.D. [facility] 10/06/2015, 11:58 AM  Pager: [phone] Between 7am to 7pm - call Pager - [phone]  After 7pm go to [email or web address] - password [email or web address]  Call night coverage person covering after 7pm

## 2015-10-07 ENCOUNTER — Inpatient Hospital Stay (HOSPITAL_COMMUNITY): Payer: Medicaid Other | Admitting: Anesthesiology

## 2015-10-07 ENCOUNTER — Encounter (HOSPITAL_COMMUNITY): Payer: Self-pay | Admitting: Anesthesiology

## 2015-10-07 ENCOUNTER — Encounter (HOSPITAL_COMMUNITY): Payer: Self-pay | Admitting: *Deleted

## 2015-10-07 ENCOUNTER — Encounter (HOSPITAL_COMMUNITY)
Admission: EM | Disposition: A | Discharge status: Rehabilitation Facility | Payer: Self-pay | Source: Home / Self Care | Attending: Internal Medicine

## 2015-10-07 ENCOUNTER — Inpatient Hospital Stay (HOSPITAL_COMMUNITY)
Admission: RE | Admit: 2015-10-07 | Discharge: 2015-10-15 | DRG: 056 | Disposition: A | Discharge status: Home Health | Payer: Medicaid Other | Source: Intra-hospital | Attending: Physical Medicine & Rehabilitation | Admitting: Physical Medicine & Rehabilitation

## 2015-10-07 DIAGNOSIS — F419 Anxiety disorder, unspecified: Secondary | ICD-10-CM | POA: Diagnosis not present

## 2015-10-07 DIAGNOSIS — F4323 Adjustment disorder with mixed anxiety and depressed mood: Secondary | ICD-10-CM | POA: Diagnosis not present

## 2015-10-07 DIAGNOSIS — K029 Dental caries, unspecified: Secondary | ICD-10-CM | POA: Diagnosis present

## 2015-10-07 DIAGNOSIS — Z79899 Other long term (current) drug therapy: Secondary | ICD-10-CM | POA: Diagnosis not present

## 2015-10-07 DIAGNOSIS — Z791 Long term (current) use of non-steroidal anti-inflammatories (NSAID): Secondary | ICD-10-CM | POA: Diagnosis not present

## 2015-10-07 DIAGNOSIS — G060 Intracranial abscess and granuloma: Secondary | ICD-10-CM | POA: Diagnosis not present

## 2015-10-07 DIAGNOSIS — K5900 Constipation, unspecified: Secondary | ICD-10-CM

## 2015-10-07 DIAGNOSIS — G8194 Hemiplegia, unspecified affecting left nondominant side: Principal | ICD-10-CM

## 2015-10-07 DIAGNOSIS — B954 Other streptococcus as the cause of diseases classified elsewhere: Secondary | ICD-10-CM

## 2015-10-07 DIAGNOSIS — K045 Chronic apical periodontitis: Secondary | ICD-10-CM

## 2015-10-07 DIAGNOSIS — K0889 Other specified disorders of teeth and supporting structures: Secondary | ICD-10-CM

## 2015-10-07 DIAGNOSIS — J4520 Mild intermittent asthma, uncomplicated: Secondary | ICD-10-CM | POA: Diagnosis not present

## 2015-10-07 DIAGNOSIS — J45909 Unspecified asthma, uncomplicated: Secondary | ICD-10-CM

## 2015-10-07 DIAGNOSIS — K047 Periapical abscess without sinus: Secondary | ICD-10-CM | POA: Diagnosis not present

## 2015-10-07 DIAGNOSIS — M069 Rheumatoid arthritis, unspecified: Secondary | ICD-10-CM | POA: Diagnosis not present

## 2015-10-07 DIAGNOSIS — Z9889 Other specified postprocedural states: Secondary | ICD-10-CM

## 2015-10-07 DIAGNOSIS — K083 Retained dental root: Secondary | ICD-10-CM | POA: Diagnosis present

## 2015-10-07 HISTORY — PX: TOOTH EXTRACTION: SHX859

## 2015-10-07 LAB — TISSUE CULTURE

## 2015-10-07 SURGERY — DENTAL RESTORATION/EXTRACTIONS
Anesthesia: General

## 2015-10-07 MED ORDER — LIDOCAINE HCL (PF) 2 % IJ SOLN
INTRAMUSCULAR | Status: DC | PRN
  Administered 2015-10-07: 1.7 mL

## 2015-10-07 MED ORDER — SENNA 8.6 MG PO TABS
1.0000 | ORAL_TABLET | Freq: Two times a day (BID) | ORAL | Status: DC
  Administered 2015-10-07 – 2015-10-15 (×9): 8.6 mg via ORAL
  Filled 2015-10-07 (×13): qty 1

## 2015-10-07 MED ORDER — DOCUSATE SODIUM 100 MG PO CAPS
200.0000 mg | ORAL_CAPSULE | Freq: Two times a day (BID) | ORAL | Status: DC
  Administered 2015-10-07 – 2015-10-15 (×9): 200 mg via ORAL
  Filled 2015-10-07 (×12): qty 2

## 2015-10-07 MED ORDER — SUCCINYLCHOLINE CHLORIDE 20 MG/ML IJ SOLN
INTRAMUSCULAR | Status: AC
  Filled 2015-10-07: qty 1

## 2015-10-07 MED ORDER — LACTATED RINGERS IV SOLN
INTRAVENOUS | Status: DC | PRN
  Administered 2015-10-07: 07:00:00 via INTRAVENOUS

## 2015-10-07 MED ORDER — ONDANSETRON HCL 4 MG/2ML IJ SOLN: 4.0000 mg | Freq: Once | INTRAMUSCULAR | Status: DC | PRN

## 2015-10-07 MED ORDER — EPHEDRINE SULFATE 50 MG/ML IJ SOLN
INTRAMUSCULAR | Status: AC
  Filled 2015-10-07: qty 1

## 2015-10-07 MED ORDER — ALBUTEROL SULFATE HFA 108 (90 BASE) MCG/ACT IN AERS
INHALATION_SPRAY | RESPIRATORY_TRACT | Status: DC | PRN
Start: 2015-10-07 — End: 2015-10-07
  Administered 2015-10-07: 6 via RESPIRATORY_TRACT
  Administered 2015-10-07: 2 via RESPIRATORY_TRACT

## 2015-10-07 MED ORDER — ALBUTEROL SULFATE (2.5 MG/3ML) 0.083% IN NEBU
3.0000 mL | INHALATION_SOLUTION | Freq: Four times a day (QID) | RESPIRATORY_TRACT | Status: DC | PRN
  Administered 2015-10-07: 3 mL via RESPIRATORY_TRACT
  Filled 2015-10-07: qty 3

## 2015-10-07 MED ORDER — ROCURONIUM BROMIDE 50 MG/5ML IV SOLN
INTRAVENOUS | Status: AC
  Filled 2015-10-07: qty 1

## 2015-10-07 MED ORDER — ONDANSETRON HCL 4 MG/2ML IJ SOLN: 4.0000 mg | Freq: Four times a day (QID) | INTRAMUSCULAR | Status: DC | PRN

## 2015-10-07 MED ORDER — FENTANYL CITRATE (PF) 100 MCG/2ML IJ SOLN
INTRAMUSCULAR | Status: DC | PRN
  Administered 2015-10-07 (×3): 50 ug via INTRAVENOUS

## 2015-10-07 MED ORDER — HYDROMORPHONE HCL 1 MG/ML IJ SOLN: 0.5000 mg | INTRAMUSCULAR | Status: DC | PRN

## 2015-10-07 MED ORDER — ACETAMINOPHEN 325 MG PO TABS: 325.0000 mg | ORAL_TABLET | ORAL | Status: DC | PRN

## 2015-10-07 MED ORDER — METRONIDAZOLE 500 MG PO TABS
500.0000 mg | ORAL_TABLET | Freq: Four times a day (QID) | ORAL | Status: DC
  Administered 2015-10-07: 500 mg via ORAL

## 2015-10-07 MED ORDER — DEXAMETHASONE SODIUM PHOSPHATE 4 MG/ML IJ SOLN: 4.0000 mg | Freq: Two times a day (BID) | INTRAMUSCULAR | Status: DC

## 2015-10-07 MED ORDER — ALPRAZOLAM 0.25 MG PO TABS
0.2500 mg | ORAL_TABLET | Freq: Three times a day (TID) | ORAL | Status: DC | PRN
  Administered 2015-10-11 – 2015-10-13 (×3): 0.25 mg via ORAL
  Filled 2015-10-07 (×2): qty 1

## 2015-10-07 MED ORDER — ACETAMINOPHEN 650 MG RE SUPP: 650.0000 mg | RECTAL | Status: DC | PRN

## 2015-10-07 MED ORDER — ROCURONIUM BROMIDE 100 MG/10ML IV SOLN
INTRAVENOUS | Status: DC | PRN
  Administered 2015-10-07: 35 mg via INTRAVENOUS

## 2015-10-07 MED ORDER — ONDANSETRON HCL 4 MG/2ML IJ SOLN
INTRAMUSCULAR | Status: AC
  Filled 2015-10-07: qty 2

## 2015-10-07 MED ORDER — NEOSTIGMINE METHYLSULFATE 10 MG/10ML IV SOLN
INTRAVENOUS | Status: AC
  Filled 2015-10-07: qty 1

## 2015-10-07 MED ORDER — OXYCODONE-ACETAMINOPHEN 5-325 MG PO TABS
ORAL_TABLET | ORAL | Status: AC
  Filled 2015-10-07: qty 2

## 2015-10-07 MED ORDER — LIDOCAINE HCL (CARDIAC) 20 MG/ML IV SOLN
INTRAVENOUS | Status: AC
  Filled 2015-10-07: qty 5

## 2015-10-07 MED ORDER — ACETAMINOPHEN 325 MG PO TABS: 650.0000 mg | ORAL_TABLET | ORAL | Status: DC | PRN

## 2015-10-07 MED ORDER — PROPOFOL 10 MG/ML IV BOLUS
INTRAVENOUS | Status: AC
  Filled 2015-10-07: qty 40

## 2015-10-07 MED ORDER — IPRATROPIUM BROMIDE 0.02 % IN SOLN
0.5000 mg | RESPIRATORY_TRACT | Status: DC | PRN
  Administered 2015-10-08: 0.5 mg via RESPIRATORY_TRACT
  Filled 2015-10-07: qty 2.5

## 2015-10-07 MED ORDER — BUPIVACAINE-EPINEPHRINE (PF) 0.5% -1:200000 IJ SOLN
INTRAMUSCULAR | Status: AC
  Filled 2015-10-07: qty 3.6

## 2015-10-07 MED ORDER — PANTOPRAZOLE SODIUM 40 MG PO TBEC
40.0000 mg | DELAYED_RELEASE_TABLET | Freq: Every day | ORAL | Status: DC
  Administered 2015-10-07 – 2015-10-14 (×7): 40 mg via ORAL
  Filled 2015-10-07 (×8): qty 1

## 2015-10-07 MED ORDER — SUGAMMADEX SODIUM 200 MG/2ML IV SOLN
INTRAVENOUS | Status: AC
  Filled 2015-10-07: qty 2

## 2015-10-07 MED ORDER — SORBITOL 70 % SOLN
30.0000 mL | Freq: Every day | Status: DC | PRN
  Filled 2015-10-07: qty 30

## 2015-10-07 MED ORDER — FENTANYL CITRATE (PF) 250 MCG/5ML IJ SOLN
INTRAMUSCULAR | Status: AC
  Filled 2015-10-07: qty 5

## 2015-10-07 MED ORDER — ONDANSETRON HCL 4 MG/2ML IJ SOLN
INTRAMUSCULAR | Status: DC | PRN
  Administered 2015-10-07: 4 mg via INTRAVENOUS

## 2015-10-07 MED ORDER — MIDAZOLAM HCL 5 MG/5ML IJ SOLN
INTRAMUSCULAR | Status: DC | PRN
  Administered 2015-10-07: 2 mg via INTRAVENOUS

## 2015-10-07 MED ORDER — PROPOFOL 10 MG/ML IV BOLUS
INTRAVENOUS | Status: DC | PRN
  Administered 2015-10-07: 140 mg via INTRAVENOUS

## 2015-10-07 MED ORDER — ONDANSETRON HCL 4 MG PO TABS
4.0000 mg | ORAL_TABLET | Freq: Four times a day (QID) | ORAL | Status: DC | PRN
  Administered 2015-10-08 – 2015-10-10 (×2): 4 mg via ORAL
  Filled 2015-10-07 (×2): qty 1

## 2015-10-07 MED ORDER — DEXAMETHASONE 4 MG PO TABS
4.0000 mg | ORAL_TABLET | Freq: Four times a day (QID) | ORAL | Status: DC
Start: 2015-10-07 — End: 2015-10-10
  Administered 2015-10-07 – 2015-10-10 (×11): 4 mg via ORAL
  Filled 2015-10-07 (×11): qty 1

## 2015-10-07 MED ORDER — ALBUTEROL SULFATE (2.5 MG/3ML) 0.083% IN NEBU
3.0000 mL | INHALATION_SOLUTION | RESPIRATORY_TRACT | Status: DC | PRN
  Administered 2015-10-08 – 2015-10-11 (×7): 3 mL via RESPIRATORY_TRACT
  Filled 2015-10-07 (×7): qty 3

## 2015-10-07 MED ORDER — LIDOCAINE HCL (CARDIAC) 20 MG/ML IV SOLN
INTRAVENOUS | Status: DC | PRN
  Administered 2015-10-07: 40 mg via INTRAVENOUS

## 2015-10-07 MED ORDER — SODIUM CHLORIDE 0.9 % IJ SOLN
INTRAMUSCULAR | Status: AC
  Filled 2015-10-07: qty 10

## 2015-10-07 MED ORDER — ASPIRIN EC 81 MG PO TBEC
81.0000 mg | DELAYED_RELEASE_TABLET | Freq: Every day | ORAL | Status: DC
  Administered 2015-10-08 – 2015-10-15 (×8): 81 mg via ORAL
  Filled 2015-10-07 (×8): qty 1

## 2015-10-07 MED ORDER — OXYMETAZOLINE HCL 0.05 % NA SOLN
NASAL | Status: AC
  Filled 2015-10-07: qty 15

## 2015-10-07 MED ORDER — SUGAMMADEX SODIUM 200 MG/2ML IV SOLN
INTRAVENOUS | Status: DC | PRN
  Administered 2015-10-07: 200 mg via INTRAVENOUS

## 2015-10-07 MED ORDER — LIDOCAINE-EPINEPHRINE 2 %-1:100000 IJ SOLN
INTRAMUSCULAR | Status: AC
Start: 2015-10-07 — End: 2015-10-07
  Filled 2015-10-07: qty 10.2

## 2015-10-07 MED ORDER — 0.9 % SODIUM CHLORIDE (POUR BTL) OPTIME
TOPICAL | Status: DC | PRN
  Administered 2015-10-07: 1000 mL

## 2015-10-07 MED ORDER — DEXTROSE 5 % IV SOLN
2.0000 g | Freq: Two times a day (BID) | INTRAVENOUS | Status: DC
  Administered 2015-10-07 – 2015-10-15 (×16): 2 g via INTRAVENOUS
  Filled 2015-10-07 (×18): qty 2

## 2015-10-07 MED ORDER — OXYCODONE-ACETAMINOPHEN 5-325 MG PO TABS
1.0000 | ORAL_TABLET | ORAL | Status: DC | PRN
  Administered 2015-10-07 – 2015-10-12 (×19): 2 via ORAL
  Administered 2015-10-13: 1 via ORAL
  Administered 2015-10-13 – 2015-10-15 (×8): 2 via ORAL
  Filled 2015-10-07 (×11): qty 2
  Filled 2015-10-07: qty 1
  Filled 2015-10-07 (×12): qty 2
  Filled 2015-10-07: qty 1
  Filled 2015-10-07 (×5): qty 2

## 2015-10-07 MED ORDER — CEFAZOLIN SODIUM-DEXTROSE 2-3 GM-% IV SOLR
INTRAVENOUS | Status: DC | PRN
  Administered 2015-10-07: 2 g via INTRAVENOUS

## 2015-10-07 MED ORDER — MIDAZOLAM HCL 2 MG/2ML IJ SOLN
INTRAMUSCULAR | Status: AC
  Filled 2015-10-07: qty 2

## 2015-10-07 MED ORDER — LIDOCAINE HCL 4 % EX SOLN
CUTANEOUS | Status: DC | PRN
  Administered 2015-10-07: 4 mL via TOPICAL

## 2015-10-07 MED ORDER — ARTIFICIAL TEARS OP OINT
TOPICAL_OINTMENT | OPHTHALMIC | Status: DC | PRN
  Administered 2015-10-07: 1 via OPHTHALMIC

## 2015-10-07 MED ORDER — BUPIVACAINE-EPINEPHRINE 0.5% -1:200000 IJ SOLN
INTRAMUSCULAR | Status: DC | PRN
  Administered 2015-10-07: 1.8 mL

## 2015-10-07 MED ORDER — LACTATED RINGERS IV SOLN: INTRAVENOUS | Status: DC

## 2015-10-07 MED ORDER — GLYCOPYRROLATE 0.2 MG/ML IJ SOLN
INTRAMUSCULAR | Status: AC
  Filled 2015-10-07: qty 4

## 2015-10-07 MED ORDER — METRONIDAZOLE 500 MG PO TABS
500.0000 mg | ORAL_TABLET | Freq: Four times a day (QID) | ORAL | Status: DC
  Administered 2015-10-07 – 2015-10-15 (×31): 500 mg via ORAL
  Filled 2015-10-07 (×30): qty 1

## 2015-10-07 SURGICAL SUPPLY — 40 items
ALCOHOL 70% 16 OZ (MISCELLANEOUS) ×3 IMPLANT
ATTRACTOMAT 16X20 MAGNETIC DRP (DRAPES) ×3 IMPLANT
BLADE 10 SAFETY STRL DISP (BLADE) ×2 IMPLANT
BLADE SURG 15 STRL LF DISP TIS (BLADE) ×2 IMPLANT
BLADE SURG 15 STRL SS (BLADE) ×6
CANISTER SUCTION 2500CC (MISCELLANEOUS) ×3 IMPLANT
COVER SURGICAL LIGHT HANDLE (MISCELLANEOUS) ×3 IMPLANT
CRADLE DONUT ADULT HEAD (MISCELLANEOUS) ×3 IMPLANT
GAUZE PACKING FOLDED 2  STR (GAUZE/BANDAGES/DRESSINGS) ×2
GAUZE PACKING FOLDED 2 STR (GAUZE/BANDAGES/DRESSINGS) ×1 IMPLANT
GAUZE SPONGE 4X4 16PLY XRAY LF (GAUZE/BANDAGES/DRESSINGS) ×3 IMPLANT
GLOVE BIOGEL PI IND STRL 8 (GLOVE) IMPLANT
GLOVE BIOGEL PI INDICATOR 8 (GLOVE)
GLOVE SURG ORTHO 8.0 STRL STRW (GLOVE) ×3 IMPLANT
GLOVE SURG SS PI 6.5 STRL IVOR (GLOVE) ×5 IMPLANT
GOWN STRL REUS W/TWL 2XL LVL3 (GOWN DISPOSABLE) ×3 IMPLANT
HEMOSTAT SURGICEL .5X2 ABSORB (HEMOSTASIS) IMPLANT
HEMOSTAT SURGICEL 2X14 (HEMOSTASIS) IMPLANT
KIT BASIN OR (CUSTOM PROCEDURE TRAY) ×2 IMPLANT
KIT ROOM TURNOVER OR (KITS) ×3 IMPLANT
NDL BLUNT 16X1.5 OR ONLY (NEEDLE) ×1 IMPLANT
NDL DENTAL 27 LONG (NEEDLE) IMPLANT
NEEDLE BLUNT 16X1.5 OR ONLY (NEEDLE) ×3 IMPLANT
NEEDLE DENTAL 27 LONG (NEEDLE) ×6 IMPLANT
NS IRRIG 1000ML POUR BTL (IV SOLUTION) ×3 IMPLANT
PACK EENT II TURBAN DRAPE (CUSTOM PROCEDURE TRAY) ×3 IMPLANT
PAD ARMBOARD 7.5X6 YLW CONV (MISCELLANEOUS) ×6 IMPLANT
SPONGE SURGIFOAM ABS GEL 100 (HEMOSTASIS) IMPLANT
SPONGE SURGIFOAM ABS GEL 12-7 (HEMOSTASIS) IMPLANT
SPONGE SURGIFOAM ABS GEL SZ50 (HEMOSTASIS) IMPLANT
SUCTION FRAZIER TIP 10 FR DISP (SUCTIONS) ×3 IMPLANT
SUT CHROMIC 3 0 PS 2 (SUTURE) ×6 IMPLANT
SUT CHROMIC 4 0 P 3 18 (SUTURE) IMPLANT
SYR 50ML SLIP (SYRINGE) ×3 IMPLANT
TOWEL OR 17X24 6PK STRL BLUE (TOWEL DISPOSABLE) ×3 IMPLANT
TOWEL OR 17X26 10 PK STRL BLUE (TOWEL DISPOSABLE) ×4 IMPLANT
TUBE CONNECTING 12'X1/4 (SUCTIONS) ×1
TUBE CONNECTING 12X1/4 (SUCTIONS) ×2 IMPLANT
WATER STERILE IRR 1000ML POUR (IV SOLUTION) ×3 IMPLANT
YANKAUER SUCT BULB TIP NO VENT (SUCTIONS) ×3 IMPLANT

## 2015-10-07 NOTE — Discharge Instructions (Signed)

## 2015-10-07 NOTE — PMR Pre-admission (Signed)
PMR Admission Coordinator Pre-Admission Assessment  Patient: [name] is an 50 y.o., female MRN: [id number] DOB: 1966/05/07 Height: 5\' 9"  (175.3 cm) Weight: 113.399 kg (250 lb)              Insurance Information HMO:     PPO:      PCP:      IPA:      80/20:      OTHER:  PRIMARY: Medicaid [place] Access      Policy#: [id number]      Subscriber: Self CM Name:       Phone#:      Fax#:  Pre-Cert#: Eligible [id number] COE: MAFCN      Employer: Not employed  Benefits:  Phone #: [phone]     Name: Verified via Passport 10/06/15 Eff. Date:      Deduct:       Out of Pocket Max:       Life Max:  CIR:       SNF:  Outpatient:      Co-Pay:  Home Health:       Co-Pay:  DME:      Co-Pay:  Providers:   Medicaid Application Date:       Case Manager:  Disability Application Date:       Case Worker:   Emergency Contact Information Contact Information    Name Relation Home Work Mobile   [place] Sister [phone]  [phone]   [place], [place]    [phone]     Current Medical History  Patient Admitting Diagnosis: Brain abscess   History of Present Illness: [name] is a 50 y.o. right handed female with history of asthma as well as recent tooth infection. Patient lives alone with 2 young children and independent prior to admission. She plans to stay with her sister on discharge who works day shift as a Radio producer, but notes she has another sister who will stay with her during the day. One level home with one-step entry. Presented 10/02/2015 with sudden onset of headache and left-sided paresthesias and mild weakness. CT and MRI of the brain demonstrated a ring-enhancing right postcentral lesions suspicious for abscess. Underwent stereotactic right frontoparietal craniotomy for resection of lesion 10/03/2015 per Dr. Kathyrn Sheriff. Decadron protocol and taper. Gram stain with gram-negative rods but also GPC in pairs. Infectious disease  consulted and presently on Maxipime as well as Flagyl with ?duration. He underwent tooth extraction per dental surgery 10/07/2015 for recent tooth infection. Tolerating a regular diet. Physical and occupational therapy evaluations completed 10/04/2015 with recommendations of physical medicine rehabilitation consult. Patient was admitted for comprehensive rehabilitation program.   NIH Total: 3   Past Medical History  Past Medical History  Diagnosis Date  . Asthma   . Rotator cuff tear   . Perimenopausal   . Slipped intervertebral disc   . Meniscus tear   . Chronic knee pain     right  . Rheumatoid arthritis ([place])    Family History  family history includes Alcohol abuse in her father; Cancer in her father and mother; Diabetes in her father.  Prior Rehab/Hospitalizations:  Has the patient had major surgery during 100 days prior to admission? No, not prior to this hospital stay  Current Medications   Current facility-administered medications:  .  acetaminophen (TYLENOL) tablet 650 mg, 650 mg, Oral, Q4H PRN **OR** acetaminophen (TYLENOL) suppository 650 mg, 650 mg, Rectal, Q4H PRN, Consuella Lose, MD .  albuterol (PROVENTIL) (2.5 MG/3ML) 0.083% nebulizer solution  3 mL, 3 mL, Inhalation, Q6H PRN, Caren Griffins, MD, 3 mL at 10/06/15 2346 .  ALPRAZolam (XANAX) tablet 0.25 mg, 0.25 mg, Oral, TID PRN, Caren Griffins, MD, 0.25 mg at 10/03/15 1117 .  aspirin EC tablet 81 mg, 81 mg, Oral, Daily, Caren Griffins, MD, 81 mg at 10/07/15 1036 .  bisacodyl (DULCOLAX) suppository 10 mg, 10 mg, Rectal, QHS, Ripudeep K Rai, MD, 10 mg at 10/05/15 2114 .  cefTRIAXone (ROCEPHIN) 2 g in dextrose 5 % 50 mL IVPB, 2 g, Intravenous, Q12H, Thayer Headings, MD, 2 g at 10/06/15 1807 .  dexamethasone (DECADRON) injection 4 mg, 4 mg, Intravenous, 4 times per day, Consuella Lose, MD, 4 mg at 10/07/15 0544 .  docusate sodium (COLACE) capsule 200 mg, 200 mg, Oral, BID, Ripudeep K Rai, MD, 200 mg at 10/07/15  1035 .  HYDROmorphone (DILAUDID) injection 1 mg, 1 mg, Intravenous, Q4H PRN, Ripudeep K Rai, MD .  lactated ringers infusion, , Intravenous, Continuous, Lenn Cal, DDS .  metroNIDAZOLE (FLAGYL) tablet 500 mg, 500 mg, Oral, Q6H, Thayer Headings, MD .  ondansetron (ZOFRAN-ODT) disintegrating tablet 4 mg, 4 mg, Oral, Q8H PRN, Caren Griffins, MD, 4 mg at 10/07/15 0544 .  oxyCODONE-acetaminophen (PERCOCET/ROXICET) 5-325 MG per tablet 1-2 tablet, 1-2 tablet, Oral, Q4H PRN, Caren Griffins, MD, 2 tablet at 10/07/15 0915 .  oxyCODONE-acetaminophen (PERCOCET/ROXICET) 5-325 MG per tablet, , , ,  .  pantoprazole (PROTONIX) EC tablet 40 mg, 40 mg, Oral, QHS, Ripudeep K Rai, MD, 40 mg at 10/06/15 2103 .  promethazine (PHENERGAN) tablet 12.5-25 mg, 12.5-25 mg, Oral, Q4H PRN, Consuella Lose, MD, 25 mg at 10/05/15 1040 .  senna (SENOKOT) tablet 8.6 mg, 1 tablet, Oral, BID, Consuella Lose, MD, 8.6 mg at 10/07/15 1036  Patients Current Diet: Diet clear liquid Room service appropriate?: No; Fluid consistency:: Thin with plan to progress as tolerated post op.  Precautions / Restrictions Precautions Precautions: Fall Restrictions Weight Bearing Restrictions: No   Has the patient had 2 or more falls or a fall with injury in the past year?No Patient reports 2 falls in 2015 due to a torn meniscus    Prior Activity Level Community (5-7x/wk): Prior to admission patient was Independent. She was caring for her twin 28 year olds sons, one of whom has Autism.  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      Home Assistive Devices / Equipment Home Assistive Devices/Equipment: Eyeglasses Home Equipment: None  Prior Device Use: Indicate devices/aids used by the patient prior to current illness, exacerbation or injury? None  Prior Functional Level Prior Function Level of Independence: Independent Comments: works  Self Care: Did the patient need help bathing, dressing, using the  toilet or eating?  Independent  Indoor Mobility: Did the patient need assistance with walking from room to room (with or without device)? Independent  Stairs: Did the patient need assistance with internal or external stairs (with or without device)? Independent  Functional Cognition: Did the patient need help planning regular tasks such as shopping or remembering to take medications? Independent  Current Functional Level Cognition  Overall Cognitive Status: Impaired/Different from baseline Current Attention Level: Alternating Orientation Level: Oriented X4 Following Commands: Follows one step commands with increased time, Follows multi-step commands inconsistently, Follows multi-step commands with increased time Safety/Judgement: Decreased awareness of deficits, Decreased awareness of safety General Comments: pt with decreased awareness of L side and running into doorframe or Lt wall with RW  consistently throughout session (requires max cues to attend to Lt visual field--keeps head oriented to her Rt).  pt with R sided gaze preference, but can visually attend Bil.      Extremity Assessment (includes Sensation/Coordination)  Upper Extremity Assessment: LUE deficits/detail LUE Deficits / Details: generalized weakness; apparent sensory deficits; stereognosis and tactile discrimination and proprioception appear impaired LUE Sensation: decreased light touch, decreased proprioception LUE Coordination: decreased fine motor  Lower Extremity Assessment: Defer to PT evaluation LLE Deficits / Details: Actively moving LE with strength grossly 3/5.  Impaired soft touch sensation, proprioception, and kinesthetic sense.  Decreased coordination, but question if this is mroe due to impaired sensation vs truly coordination deficit.   LLE Sensation: decreased light touch, decreased proprioception LLE Coordination: decreased fine motor, decreased gross motor    ADLs  Overall ADL's : Needs  assistance/impaired Eating/Feeding: Set up Grooming: Set up Upper Body Bathing: Set up, Supervision/ safety, Sitting Lower Body Bathing: Moderate assistance Upper Body Dressing : Minimal assistance Lower Body Dressing: Moderate assistance Toilet Transfer: Minimal assistance, Ambulation, RW Toilet Transfer Details (indicate cue type and reason): improved safety wtih RW. Difficulty with managing RW Toileting- Clothing Manipulation and Hygiene: Minimal assistance Toileting - Clothing Manipulation Details (indicate cue type and reason): foley Functional mobility during ADLs: Minimal assistance, Rolling walker, Cueing for safety General ADL Comments: Pt ambulating to bathroom. Running into L doorframe of bathroom. leaving LUE outside of bathroom adn required phusical cues to bring LUE into bathroom    Mobility  Overal bed mobility: Needs Assistance Bed Mobility: Supine to Sit Supine to sit: HOB elevated, Supervision Sit to supine:  (pt on BSC with RN on PT arrival) General bed mobility comments: uses her RUE to assist LLE off eOB    Transfers  Overall transfer level: Needs assistance Equipment used: Rolling walker (2 wheeled) Transfers: Sit to/from Stand Sit to Stand: Min assist Stand pivot transfers: Min assist General transfer comment: x 3 with max cues for safe use of RW each time; steadying assist    Ambulation / Gait / Stairs / Wheelchair Mobility  Ambulation/Gait Ambulation/Gait assistance: Museum/gallery curator (Feet): 12 Feet (10, 80 (seated rests between)) Assistive device: Rolling walker (2 wheeled) Gait Pattern/deviations: Step-through pattern, Decreased stride length, Decreased weight shift to right, Drifts right/left General Gait Details: pt tends to lean to L side and needs cueing for attending to objects and doorway on L side as pt left her L UE on outside of door frame and was continuing to try to walk through door.   Gait velocity interpretation: Below normal  speed for age/gender    Posture / Balance Balance Overall balance assessment: Needs assistance Sitting-balance support: No upper extremity supported, Feet supported Sitting balance-Leahy Scale: Fair Standing balance support: During functional activity Standing balance-Leahy Scale: Poor    Special needs/care consideration BiPAP/CPAP: No CPM: No Continuous Drip IV: None Dialysis: No        Days: N/A Life Vest: No Oxygen: No Special Bed: No Trach Size: N/A Wound Vac (area): N/A      Location: N/A Skin: WDL                              Location: N/A Bowel mgmt: 1/18 Bladder mgmt: Foley catheter today post op; as of 10/06/15 continent  Diabetic mgmt: N/A     Previous Home Environment Living Arrangements: Children, Other relatives Available Help at Discharge: Family, Available 24 hours/day Type of  Home: House Home Layout: One level Alternate Level Stairs-Number of Steps: flight Home Access: Stairs to enter Entrance Stairs-Number of Steps: 1 Bathroom Shower/Tub: Public librarian, Multimedia programmer: Standard Bathroom Accessibility: Yes How Accessible: Accessible via walker [place]: No  Discharge Living Setting Plans for Discharge Living Setting: House, Other (Comment) (Sister Robbin's house) Type of Home at Discharge: [place]: One level Discharge Home Access: Other (comment) (threshold) Discharge Bathroom Shower/Tub: Walk-in shower Discharge Bathroom Toilet: Standard Discharge Bathroom Accessibility: Yes How Accessible: Accessible via walker Does the patient have any problems obtaining your medications?: No  Social/Family/Support Systems Patient Roles: Parent, Caregiver Anticipated Caregiver: Sisters Robbin and Vicky Anticipated Caregiver's Contact Information: [place] phone: [phone] Ability/Limitations of Caregiver: [name] works during the day and Visky will be staying with the patient  Caregiver Availability: Evenings  only Discharge Plan Discussed with Primary Caregiver: Yes Is Caregiver In Agreement with Plan?: Yes Does Caregiver/Family have Issues with Lodging/Transportation while Pt is in Rehab?: No   Goals/Additional Needs Patient/Family Goal for Rehab: PT Mod I-Supervision, OT Mod I, SLP N/A Expected length of stay: 10-12 day Cultural Considerations: None Dietary Needs: None Equipment Needs: TBD Special Service Needs: None Additional Information: Patient reports that she is interested in [place] for a job that is more of a 9-5 once she is ready to return to work. Pt/Family Agrees to Admission and willing to participate: Yes Program Orientation Provided & Reviewed with Pt/Caregiver Including Roles  & Responsibilities: Yes Additional Information Needs: None Information Needs to be Provided By: N/A  Decrease burden of Care through IP rehab admission: Other, Not anticipated   Possible need for SNF placement upon discharge: Not anticipated   Patient Condition: This patient's condition remains as documented in the consult dated 10/05/15 at 1501, in which the Rehabilitation Physician determined and documented that the patient's condition is appropriate for intensive rehabilitative care in an inpatient rehabilitation facility. Will admit to inpatient rehab today.  Preadmission Screen Completed By:  Gunnar Fusi, 10/07/2015 11:56 AM ______________________________________________________________________   Discussed status with Dr. Naaman Plummer on 10/07/15 at 1157 and received telephone approval for admission today.  Admission Coordinator:  Gunnar Fusi, time 1157/Date 10/07/15

## 2015-10-07 NOTE — Progress Notes (Signed)
[name] Rehab Admission Coordinator Signed Physical Medicine and Rehabilitation PMR Pre-admission 10/07/2015 10:01 AM  Related encounter: ED to Hosp-Admission (Discharged) from 09/28/2015 in [place] Collapse All   PMR Admission Coordinator Pre-Admission Assessment  Patient: [name] is an 50 y.o., female MRN: [id number] DOB: 04-11-1966 Height: 5\' 9"  (175.3 cm) Weight: 113.399 kg (250 lb)  Insurance Information HMO: PPO: PCP: IPA: 80/20: OTHER:  PRIMARY: Medicaid [address] Access Policy#: [id number] Subscriber: Self CM Name: Phone#: Fax#:  Pre-Cert#: Eligible [id number] COE: MAFCN Employer: Not employed  Benefits: Phone #: [phone] Name: Verified via Passport 10/06/15 Eff. Date: Deduct: Out of Pocket Max: Life Max:  CIR: SNF:  Outpatient: Co-Pay:  Home Health: Co-Pay:  DME: Co-Pay:  Providers:   Medicaid Application Date: Case Manager:  Disability Application Date: Case Worker:   Emergency Contact Information Contact Information    Name Relation Home Work Mobile   [place] Sister [phone]  [phone]   Landry, Triglia [place]    [phone]     Current Medical History  Patient Admitting Diagnosis: Brain abscess  History of Present Illness: [name] is a 50 y.o. right handed female with history of asthma as well as recent tooth infection. Patient lives alone with 2 young children and independent prior to admission. She plans to stay with her sister on discharge who works day shift as a Radio producer, but notes she has another sister who will stay with her during the day. One level  home with one-step entry. Presented 10/02/2015 with sudden onset of headache and left-sided paresthesias and mild weakness. CT and MRI of the brain demonstrated a ring-enhancing right postcentral lesions suspicious for abscess. Underwent stereotactic right frontoparietal craniotomy for resection of lesion 10/03/2015 per Dr. Kathyrn Sheriff. Decadron protocol and taper. Gram stain with gram-negative rods but also GPC in pairs. Infectious disease consulted and presently on Maxipime as well as Flagyl with ?duration. He underwent tooth extraction per dental surgery 10/07/2015 for recent tooth infection. Tolerating a regular diet. Physical and occupational therapy evaluations completed 10/04/2015 with recommendations of physical medicine rehabilitation consult. Patient was admitted for comprehensive rehabilitation program.   NIH Total: 3   Past Medical History  Past Medical History  Diagnosis Date  . Asthma   . Rotator cuff tear   . Perimenopausal   . Slipped intervertebral disc   . Meniscus tear   . Chronic knee pain     right  . Rheumatoid arthritis ([place])    Family History  family history includes Alcohol abuse in her father; Cancer in her father and mother; Diabetes in her father.  Prior Rehab/Hospitalizations:  Has the patient had major surgery during 100 days prior to admission? No, not prior to this hospital stay  Current Medications   Current facility-administered medications:  . acetaminophen (TYLENOL) tablet 650 mg, 650 mg, Oral, Q4H PRN **OR** acetaminophen (TYLENOL) suppository 650 mg, 650 mg, Rectal, Q4H PRN, Consuella Lose, MD . albuterol (PROVENTIL) (2.5 MG/3ML) 0.083% nebulizer solution 3 mL, 3 mL, Inhalation, Q6H PRN, Caren Griffins, MD, 3 mL at 10/06/15 2346 . ALPRAZolam (XANAX) tablet 0.25 mg, 0.25 mg, Oral, TID PRN, Caren Griffins, MD, 0.25 mg at 10/03/15 1117 . aspirin EC tablet 81 mg, 81 mg, Oral, Daily, Caren Griffins, MD, 81 mg at  10/07/15 1036 . bisacodyl (DULCOLAX) suppository 10 mg, 10 mg, Rectal, QHS, Ripudeep K Rai, MD, 10 mg at 10/05/15 2114 . cefTRIAXone (ROCEPHIN) 2  g in dextrose 5 % 50 mL IVPB, 2 g, Intravenous, Q12H, Thayer Headings, MD, 2 g at 10/06/15 1807 . dexamethasone (DECADRON) injection 4 mg, 4 mg, Intravenous, 4 times per day, Consuella Lose, MD, 4 mg at 10/07/15 0544 . docusate sodium (COLACE) capsule 200 mg, 200 mg, Oral, BID, Ripudeep K Rai, MD, 200 mg at 10/07/15 1035 . HYDROmorphone (DILAUDID) injection 1 mg, 1 mg, Intravenous, Q4H PRN, Ripudeep K Rai, MD . lactated ringers infusion, , Intravenous, Continuous, Lenn Cal, DDS . metroNIDAZOLE (FLAGYL) tablet 500 mg, 500 mg, Oral, Q6H, Thayer Headings, MD . ondansetron (ZOFRAN-ODT) disintegrating tablet 4 mg, 4 mg, Oral, Q8H PRN, Caren Griffins, MD, 4 mg at 10/07/15 0544 . oxyCODONE-acetaminophen (PERCOCET/ROXICET) 5-325 MG per tablet 1-2 tablet, 1-2 tablet, Oral, Q4H PRN, Caren Griffins, MD, 2 tablet at 10/07/15 0915 . oxyCODONE-acetaminophen (PERCOCET/ROXICET) 5-325 MG per tablet, , , ,  . pantoprazole (PROTONIX) EC tablet 40 mg, 40 mg, Oral, QHS, Ripudeep K Rai, MD, 40 mg at 10/06/15 2103 . promethazine (PHENERGAN) tablet 12.5-25 mg, 12.5-25 mg, Oral, Q4H PRN, Consuella Lose, MD, 25 mg at 10/05/15 1040 . senna (SENOKOT) tablet 8.6 mg, 1 tablet, Oral, BID, Consuella Lose, MD, 8.6 mg at 10/07/15 1036  Patients Current Diet: Diet clear liquid Room service appropriate?: No; Fluid consistency:: Thin with plan to progress as tolerated post op.  Precautions / Restrictions Precautions Precautions: Fall Restrictions Weight Bearing Restrictions: No   Has the patient had 2 or more falls or a fall with injury in the past year?No Patient reports 2 falls in 2015 due to a torn meniscus   Prior Activity Level Community (5-7x/wk): Prior to admission patient was Independent. She was caring for her twin 6 year olds sons, one of  whom has Autism. 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   Home Assistive Devices / Equipment Home Assistive Devices/Equipment: Eyeglasses Home Equipment: None  Prior Device Use: Indicate devices/aids used by the patient prior to current illness, exacerbation or injury? None  Prior Functional Level Prior Function Level of Independence: Independent Comments: works  Self Care: Did the patient need help bathing, dressing, using the toilet or eating? Independent  Indoor Mobility: Did the patient need assistance with walking from room to room (with or without device)? Independent  Stairs: Did the patient need assistance with internal or external stairs (with or without device)? Independent  Functional Cognition: Did the patient need help planning regular tasks such as shopping or remembering to take medications? Independent  Current Functional Level Cognition  Overall Cognitive Status: Impaired/Different from baseline Current Attention Level: Alternating Orientation Level: Oriented X4 Following Commands: Follows one step commands with increased time, Follows multi-step commands inconsistently, Follows multi-step commands with increased time Safety/Judgement: Decreased awareness of deficits, Decreased awareness of safety General Comments: pt with decreased awareness of L side and running into doorframe or Lt wall with RW consistently throughout session (requires max cues to attend to Lt visual field--keeps head oriented to her Rt). pt with R sided gaze preference, but can visually attend Bil.    Extremity Assessment (includes Sensation/Coordination)  Upper Extremity Assessment: LUE deficits/detail LUE Deficits / Details: generalized weakness; apparent sensory deficits; stereognosis and tactile discrimination and proprioception appear impaired LUE Sensation: decreased light touch, decreased proprioception LUE Coordination: decreased fine motor  Lower  Extremity Assessment: Defer to PT evaluation LLE Deficits / Details: Actively moving LE with strength grossly 3/5. Impaired soft touch sensation, proprioception, and kinesthetic sense. Decreased coordination,  but question if this is mroe due to impaired sensation vs truly coordination deficit.  LLE Sensation: decreased light touch, decreased proprioception LLE Coordination: decreased fine motor, decreased gross motor    ADLs  Overall ADL's : Needs assistance/impaired Eating/Feeding: Set up Grooming: Set up Upper Body Bathing: Set up, Supervision/ safety, Sitting Lower Body Bathing: Moderate assistance Upper Body Dressing : Minimal assistance Lower Body Dressing: Moderate assistance Toilet Transfer: Minimal assistance, Ambulation, RW Toilet Transfer Details (indicate cue type and reason): improved safety wtih RW. Difficulty with managing RW Toileting- Clothing Manipulation and Hygiene: Minimal assistance Toileting - Clothing Manipulation Details (indicate cue type and reason): foley Functional mobility during ADLs: Minimal assistance, Rolling walker, Cueing for safety General ADL Comments: Pt ambulating to bathroom. Running into L doorframe of bathroom. leaving LUE outside of bathroom adn required phusical cues to bring LUE into bathroom    Mobility  Overal bed mobility: Needs Assistance Bed Mobility: Supine to Sit Supine to sit: HOB elevated, Supervision Sit to supine: (pt on BSC with RN on PT arrival) General bed mobility comments: uses her RUE to assist LLE off eOB    Transfers  Overall transfer level: Needs assistance Equipment used: Rolling walker (2 wheeled) Transfers: Sit to/from Stand Sit to Stand: Min assist Stand pivot transfers: Min assist General transfer comment: x 3 with max cues for safe use of RW each time; steadying assist    Ambulation / Gait / Stairs / Wheelchair Mobility  Ambulation/Gait Ambulation/Gait assistance: Surveyor, mining (Feet): 12 Feet (10, 80 (seated rests between)) Assistive device: Rolling walker (2 wheeled) Gait Pattern/deviations: Step-through pattern, Decreased stride length, Decreased weight shift to right, Drifts right/left General Gait Details: pt tends to lean to L side and needs cueing for attending to objects and doorway on L side as pt left her L UE on outside of door frame and was continuing to try to walk through door.  Gait velocity interpretation: Below normal speed for age/gender    Posture / Balance Balance Overall balance assessment: Needs assistance Sitting-balance support: No upper extremity supported, Feet supported Sitting balance-Leahy Scale: Fair Standing balance support: During functional activity Standing balance-Leahy Scale: Poor    Special needs/care consideration BiPAP/CPAP: No CPM: No Continuous Drip IV: None Dialysis: No Days: N/A Life Vest: No Oxygen: No Special Bed: No Trach Size: N/A Wound Vac (area): N/A Location: N/A Skin: WDL Location: N/A Bowel mgmt: 1/18 Bladder mgmt: Foley catheter today post op; as of 10/06/15 continent  Diabetic mgmt: N/A     Previous Home Environment Living Arrangements: Children, Other relatives Available Help at Discharge: Family, Available 24 hours/day Type of Home: House Home Layout: One level Alternate Level Stairs-Number of Steps: flight Home Access: Stairs to enter Technical brewer of Steps: 1 Bathroom Shower/Tub: Tub/shower unit, Multimedia programmer: Standard Bathroom Accessibility: Yes How Accessible: Accessible via walker [place]: No  Discharge Living Setting Plans for Discharge Living Setting: House, Other (Comment) (Sister Robbin's house) Type of Home at Discharge: House Discharge Home Layout: One level Discharge Home Access: Other (comment) (threshold) Discharge Bathroom Shower/Tub: Walk-in shower Discharge Bathroom Toilet:  Standard Discharge Bathroom Accessibility: Yes How Accessible: Accessible via walker Does the patient have any problems obtaining your medications?: No  Social/Family/Support Systems Patient Roles: Parent, Caregiver Anticipated Caregiver: Sisters Robbin and Vicky Anticipated Caregiver's Contact Information: Rob[place] phone: [phone] Ability/Limitations of Caregiver: [name] works during the day and Visky will be staying with the patient  Caregiver Availability: Evenings only Discharge Plan Discussed with  Primary Caregiver: Yes Is Caregiver In Agreement with Plan?: Yes Does Caregiver/Family have Issues with Lodging/Transportation while Pt is in Rehab?: No   Goals/Additional Needs Patient/Family Goal for Rehab: PT Mod I-Supervision, OT Mod I, SLP N/A Expected length of stay: 10-12 day Cultural Considerations: None Dietary Needs: None Equipment Needs: TBD Special Service Needs: None Additional Information: Patient reports that she is interested in searchign for a Costa that is more of a 9-5 once she is ready to return to work. Pt/Family Agrees to Admission and willing to participate: Yes Program Orientation Provided & Reviewed with Pt/Caregiver Including Roles & Responsibilities: Yes Additional Information Needs: None Information Needs to be Provided By: N/A  Decrease burden of Care through IP rehab admission: Other, Not anticipated   Possible need for SNF placement upon discharge: Not anticipated   Patient Condition: This patient's condition remains as documented in the consult dated 10/05/15 at 1501, in which the Rehabilitation Physician determined and documented that the patient's condition is appropriate for intensive rehabilitative care in an inpatient rehabilitation facility. Will admit to inpatient rehab today.  Preadmission Screen Completed By: [name], 10/07/2015 11:56 AM ______________________________________________________________________  Discussed status with Dr.  Naaman Plummer on 10/07/15 at 1157 and received telephone approval for admission today.  Admission Coordinator: [name], time 1157/Date 10/07/15          Cosigned by: Meredith Staggers, MD at 10/07/2015 12:16 PM  Revision History     Date/Time User Provider Type Action   10/07/2015 12:16 PM Meredith Staggers, MD Physician Cosign   10/07/2015 11:57 AM [name] Rehab Admission Coordinator Sign

## 2015-10-07 NOTE — Anesthesia Postprocedure Evaluation (Signed)
Anesthesia Post Note  Patient: [name]  Procedure(s) Performed: Procedure(s) (LRB): Extraction of tooth #18 with alveoloplasty (N/A)  Patient location during evaluation: PACU Anesthesia Type: General Level of consciousness: awake, oriented, sedated and patient cooperative Pain management: pain level controlled Vital Signs Assessment: post-procedure vital signs reviewed and stable Respiratory status: spontaneous breathing and respiratory function stable Cardiovascular status: blood pressure returned to baseline and stable Anesthetic complications: no    Last Vitals:  Filed Vitals:   10/06/15 2154 10/07/15 0830  BP: 122/61 152/67  Pulse: 77 86  Temp: 36.8 C 36.5 C  Resp: 18 14    Last Pain:  Filed Vitals:   10/07/15 0838  PainSc: 0-No pain                 Allessandra Bernardi EDWARD

## 2015-10-07 NOTE — H&P (View-Only) (Signed)
Physical Medicine and Rehabilitation Admission H&P    Chief Complaint  Patient presents with  . Headache  : HPI: [name] is a 50 y.o. right handed female with history of asthma as well as recent tooth infection. Patient lives alone with 2 young children and independent prior to admission. She plans to stay with her sister on discharge who works day shift as a Radio producer, but notes she has another sister who will stay with her during the day. One level home with one-step entry. Presented 10/02/2015 with sudden onset of headache and left-sided paresthesias and mild weakness. CT and MRI of the brain demonstrated a ring-enhancing right postcentral lesions suspicious for abscess.  Underwent stereotactic right frontoparietal craniotomy for resection of lesion 10/03/2015 per Dr. Kathyrn Sheriff. Decadron protocol and taper. Gram stain with gram-negative rods but also GPC in pairs. Infectious disease consulted and presently on Rocephin 6 weeks and Flagyl 500 mg 4 times a day. He underwent tooth extraction per dental surgery 10/07/2015 for recent tooth infection. Tolerating a regular diet. Physical and occupational therapy evaluation completed 10/04/2015 with recommendations of physical medicine rehabilitation consult. Patient was admitted for comprehensive rehabilitation program   ROS Constitutional: Negative for fever and chills.  HENT: Negative for hearing loss.  Eyes: Negative for blurred vision and double vision.  Respiratory: Negative for cough and shortness of breath.  Cardiovascular: Negative for chest pain, palpitations and leg swelling.  Gastrointestinal: Positive for constipation. Negative for nausea and vomiting.  Genitourinary: Negative for dysuria and hematuria.  Musculoskeletal: Positive for myalgias.  Skin: Negative for rash.  Neurological: Positive for dizziness, weakness and headaches. Negative for seizures.  All other systems reviewed and are negative   Past Medical History   Diagnosis Date  . Asthma   . Rotator cuff tear   . Perimenopausal   . Slipped intervertebral disc   . Meniscus tear   . Chronic knee pain     right  . Rheumatoid arthritis [facility])    Past Surgical History  Procedure Laterality Date  . Cesarean section    . Oophorectomy    . Tubal ligation    . Shoulder surgery    . Craniotomy Right 10/03/2015    Procedure: Stereotactic right frontoparietal craniotomy for excisional biopsy of lesion ;  Surgeon: Consuella Lose, MD;  Location: [place] [facility];  Service: Neurosurgery;  Laterality: Right;  . Application of cranial navigation Right 10/03/2015    Procedure: APPLICATION OF CRANIAL NAVIGATION;  Surgeon: Consuella Lose, MD;  Location: [place] [facility];  Service: Neurosurgery;  Laterality: Right;   Family History  Problem Relation Age of Onset  . Cancer Mother     colon cancer  . Cancer Father     brain cancer  . Alcohol abuse Father   . Diabetes Father    Social History:  reports that she has never smoked. She has never used smokeless tobacco. She reports that she does not drink alcohol or use illicit drugs. Allergies:  Allergies  Allergen Reactions  . Diclofenac Sodium Itching  . Meloxicam Itching   Medications Prior to Admission  Medication Sig Dispense Refill  . albuterol (PROVENTIL HFA;VENTOLIN HFA) 108 (90 BASE) MCG/ACT inhaler Inhale 2 puffs into the lungs every 6 (six) hours as needed for wheezing or shortness of breath. For wheezing    . cholecalciferol (VITAMIN D) 1000 UNITS tablet Take 1,000 Units by mouth daily with breakfast.    . cyclobenzaprine (FLEXERIL) 10 MG tablet Take 1 tablet (10 mg total) by mouth  2 (two) times daily as needed for muscle spasms. 20 tablet 0  . erythromycin ophthalmic ointment Place 1 application into the right eye daily.    . methocarbamol (ROBAXIN) 500 MG tablet Take 1 tablet (500 mg total) by mouth 2 (two) times daily. 20 tablet 0  . Multiple Vitamin (MULTIVITAMIN WITH MINERALS) TABS tablet Take  1 tablet by mouth daily.    . naproxen (NAPROSYN) 500 MG tablet Take 1 tablet (500 mg total) by mouth 2 (two) times daily. 30 tablet 0  . naproxen sodium (ANAPROX) 220 MG tablet Take 440 mg by mouth 2 (two) times daily as needed (pain).    Marland Kitchen oxyCODONE-acetaminophen (PERCOCET) 5-325 MG per tablet Take 1-2 tablets by mouth every 4 (four) hours as needed for severe pain. 20 tablet 0  . sodium chloride (MURO 128) 2 % ophthalmic solution Place 1 drop into the right eye 2 (two) times daily.    . sodium chloride (MURO 128) 5 % ophthalmic ointment Place 1 application into the right eye at bedtime.    . traMADol (ULTRAM) 50 MG tablet Take 1 tablet (50 mg total) by mouth every 6 (six) hours as needed. 15 tablet 0    Home: Home Living Family/patient expects to be discharged to:: Private residence Living Arrangements: Children, Other relatives Available Help at Discharge: Family, Available 24 hours/day Type of Home: House Home Access: Stairs to enter [employer] of Steps: 1 Home Layout: One level Alternate Level Stairs-Number of Steps: flight Bathroom Shower/Tub: Tub/shower unit, Multimedia programmer: Programmer, systems: Yes Home Equipment: None   Functional History: Prior Function Level of Independence: Independent Comments: works  Functional Status:  Mobility: Bed Mobility Overal bed mobility: Needs Assistance Bed Mobility: Supine to Sit Supine to sit: Supervision Sit to supine:  (pt on BSC with RN on PT arrival) General bed mobility comments: A with L LE. Transfers Overall transfer level: Needs assistance Equipment used: 1 person hand held assist Transfers: Sit to/from Stand, Stand Pivot Transfers Sit to Stand: Min assist Stand pivot transfers: Min assist General transfer comment: Pt states she feels unsteady Ambulation/Gait Ambulation/Gait assistance: Min assist, +2 physical assistance Ambulation Distance (Feet): 12 Feet (x2) Assistive device:  2 person hand held assist Gait Pattern/deviations: Step-to pattern, Decreased step length - left, Decreased stance time - left, Decreased dorsiflexion - left, Shuffle, Drifts right/left General Gait Details: pt tends to lean to L side and needs cueing for attending to objects and doorway on L side as pt left her L UE on outside of door frame and was continuing to try to walk through door.      ADL: ADL Overall ADL's : Needs assistance/impaired Eating/Feeding: Set up Grooming: Set up Upper Body Bathing: Set up, Supervision/ safety, Sitting Lower Body Bathing: Moderate assistance Upper Body Dressing : Minimal assistance Lower Body Dressing: Moderate assistance Toilet Transfer: Minimal assistance, Ambulation, RW Toilet Transfer Details (indicate cue type and reason): improved safety wtih RW. Difficulty with managing RW Toileting- Clothing Manipulation and Hygiene: Minimal assistance Toileting - Clothing Manipulation Details (indicate cue type and reason): foley Functional mobility during ADLs: Minimal assistance, Rolling walker, Cueing for safety General ADL Comments: Pt ambulating to bathroom. Running into L doorframe of bathroom. leaving LUE outside of bathroom adn required phusical cues to bring LUE into bathroom  Cognition: Cognition Overall Cognitive Status: Impaired/Different from baseline Orientation Level: Oriented X4 Cognition Arousal/Alertness: Awake/alert Behavior During Therapy: Flat affect Overall Cognitive Status: Impaired/Different from baseline Area of Impairment: Attention, Safety/judgement, Awareness, Problem  solving Current Attention Level: Alternating Following Commands: Follows multi-step commands consistently Safety/Judgement: Decreased awareness of deficits Awareness: Emergent Problem Solving: Slow processing General Comments: Pt states she feels like she is in a "fog". Continues to appear to demonstrate a R bias  Physical Exam: Blood pressure 125/50, pulse  78, temperature 98.4 F (36.9 C), temperature source Oral, resp. rate 16, height 5' 9"  (1.753 m), weight 113.399 kg (250 lb), last menstrual period 09/27/2011, SpO2 93 %. Physical Exam Constitutional: She is oriented to person, place, and time. She appears well-developed and well-nourished.  HENT: #18 site s/p extraction. No bleeding or abnormal color noted, mild tenderness Head: Normocephalic. Crani incision covered.  Right Ear: External ear normal.  Left Ear: External ear normal.  Eyes: Conjunctivae and EOM are normal.  Neck: Normal range of motion. Neck supple. No thyromegaly present.  Cardiovascular: Normal rate and regular rhythm. No murmurs, rubs, or gallops Respiratory: Effort normal and breath sounds normal. No respiratory distress.  GI: Soft. Bowel sounds are normal. She exhibits no distension.  Musculoskeletal:  PROM No tenderness in extremities  Neurological: She is alert and oriented to person, place, and time. Seems to have good insight and awareness, ?slightly disinhibited Follows full commands Sensation 1/2 LUE and LLE, perhaps more diminished distal left leg Motor: R UE/RLE: 5/5 proximal distal L UE: 3- to 3 delt, 3/5 bicep,tricep, wrist, HI.  LLE: HF 2, KE 2+, ADF/APF 3.  DTRs increased on left side versus right-sided  Skin: Skin is warm and dry.  Psychiatric: Her behavior is normal, pleasant, a little disinhibited. Judgment normal.      Results for orders placed or performed during the hospital encounter of 09/28/15 (from the past 48 hour(s))  CBC     Status: Abnormal   Collection Time: 10/05/15  4:14 AM  Result Value Ref Range   WBC 7.5 4.0 - 10.5 K/uL   RBC 3.39 (L) 3.87 - 5.11 MIL/uL   Hemoglobin 11.0 (L) 12.0 - 15.0 g/dL   HCT 32.3 (L) 36.0 - 46.0 %   MCV 95.3 78.0 - 100.0 fL   MCH 32.4 26.0 - 34.0 pg   MCHC 34.1 30.0 - 36.0 g/dL   RDW 12.4 11.5 - 15.5 %   Platelets 257 150 - 400 K/uL  Basic metabolic panel     Status: Abnormal   Collection Time:  10/05/15  4:14 AM  Result Value Ref Range   Sodium 141 135 - 145 mmol/L   Potassium 3.8 3.5 - 5.1 mmol/L   Chloride 108 101 - 111 mmol/L   CO2 26 22 - 32 mmol/L   Glucose, Bld 135 (H) 65 - 99 mg/dL   BUN 7 6 - 20 mg/dL   Creatinine, Ser 0.81 0.44 - 1.00 mg/dL   Calcium 8.3 (L) 8.9 - 10.3 mg/dL   GFR calc non Af Amer >60 >60 mL/min   GFR calc Af Amer >60 >60 mL/min    Comment: (NOTE) The eGFR has been calculated using the CKD EPI equation. This calculation has not been validated in all clinical situations. eGFR's persistently <60 mL/min signify possible Chronic Kidney Disease.    Anion gap 7 5 - 15  Basic metabolic panel     Status: Abnormal   Collection Time: 10/05/15 10:10 AM  Result Value Ref Range   Sodium 141 135 - 145 mmol/L   Potassium 3.5 3.5 - 5.1 mmol/L   Chloride 109 101 - 111 mmol/L   CO2 27 22 - 32 mmol/L   Glucose,  Bld 118 (H) 65 - 99 mg/dL   BUN 6 6 - 20 mg/dL   Creatinine, Ser 0.73 0.44 - 1.00 mg/dL   Calcium 8.5 (L) 8.9 - 10.3 mg/dL   GFR calc non Af Amer >60 >60 mL/min   GFR calc Af Amer >60 >60 mL/min    Comment: (NOTE) The eGFR has been calculated using the CKD EPI equation. This calculation has not been validated in all clinical situations. eGFR's persistently <60 mL/min signify possible Chronic Kidney Disease.    Anion gap 5 5 - 15  CBC     Status: Abnormal   Collection Time: 10/06/15  3:32 AM  Result Value Ref Range   WBC 6.8 4.0 - 10.5 K/uL   RBC 3.45 (L) 3.87 - 5.11 MIL/uL   Hemoglobin 11.1 (L) 12.0 - 15.0 g/dL   HCT 32.3 (L) 36.0 - 46.0 %   MCV 93.6 78.0 - 100.0 fL   MCH 32.2 26.0 - 34.0 pg   MCHC 34.4 30.0 - 36.0 g/dL   RDW 12.1 11.5 - 15.5 %   Platelets 272 150 - 400 K/uL   No results found.     Medical Problem List and Plan: 1.  Left-sided weakness and headache secondary to brain abscess likely secondary to dental caries status post tooth extraction 2.  DVT Prophylaxis/Anticoagulation: SCDs. Monitor for any signs of DVT 3. Pain  Management: Percocet as needed. Monitor with increased activity in therapy 4. Mood: Xanax 0.25 mg 3 times a day as needed. Appears positive at this time. 5. Neuropsych: This patient is capable of making decisions on her own behalf. 6. Skin/Wound Care: Routine skin checks. Remove incision "cover" from head 7. Fluids/Electrolytes/Nutrition: Routine I&O with follow-up chemistries ordered.  -encourage adequate PO intake 8. ID. Gram stain with gram-negative rods/GPC in pairs.Flagyl 500 mg QID. Ceftriaxone 6 weeks 9. Tooth extraction today, #18. Minimal pain. Advance diet as tolerated. Oral cavity and extraction site looks clean/healthy 10. Constipation. Laxative assistance    Post Admission Physician Evaluation: 1. Functional deficits secondary  to brain abscess. 2. Patient is admitted to receive collaborative, interdisciplinary care between the physiatrist, rehab nursing staff, and therapy team. 3. Patient's level of medical complexity and substantial therapy needs in context of that medical necessity cannot be provided at a lesser intensity of care such as a SNF. 4. Patient has experienced substantial functional loss from his/her baseline which was documented above under the "Functional History" and "Functional Status" headings.  Judging by the patient's diagnosis, physical exam, and functional history, the patient has potential for functional progress which will result in measurable gains while on inpatient rehab.  These gains will be of substantial and practical use upon discharge  in facilitating mobility and self-care at the household level. 5. Physiatrist will provide 24 hour management of medical needs as well as oversight of the therapy plan/treatment and provide guidance as appropriate regarding the interaction of the two. 6. 24 hour rehab nursing will assist with bladder management, bowel management, safety, skin/wound care, disease management, medication administration, pain management and  patient education  and help integrate therapy concepts, techniques,education, etc. 7. PT will assess and treat for/with: Lower extremity strength, range of motion, stamina, balance, functional mobility, safety, adaptive techniques and equipment, NMR, visual-spatial awareness, pain mgt, orthotics, community reintegration.   Goals are: mod I. 8. OT will assess and treat for/with: ADL's, functional mobility, safety, upper extremity strength, adaptive techniques and equipment, NMR, pain mgt, community reintegration, family and patient education.   Goals  are: mod I. Therapy may proceed with showering this patient. 9. SLP will assess and treat for/with: cognition, communication.  Goals are: mod I. 10. Case Management and Social Worker will assess and treat for psychological issues and discharge planning. 11. Team conference will be held weekly to assess progress toward goals and to determine barriers to discharge. 12. Patient will receive at least 3 hours of therapy per day at least 5 days per week. 13. ELOS: 7-10 days   14. Prognosis:  excellent     Meredith Staggers, MD, [facility] 10/07/2015   10/06/2015

## 2015-10-07 NOTE — Progress Notes (Signed)
Patient is discharged from room 5C20 at this time. Alert and in stable condition. Report given to receiving nurse Heather Roberts, RN. Transported via bed with all belongings and friend at side.

## 2015-10-07 NOTE — Progress Notes (Signed)
Patient returned from OR at this time. Alert and in stable condition.

## 2015-10-07 NOTE — Anesthesia Preprocedure Evaluation (Signed)
Anesthesia Evaluation  Patient identified by MRN, date of birth, ID band Patient awake    Reviewed: Allergy & Precautions, NPO status , Patient's Chart, lab work & pertinent test results  Airway Mallampati: I  TM Distance: >3 FB     Dental   Pulmonary asthma ,    Pulmonary exam normal        Cardiovascular Normal cardiovascular exam     Neuro/Psych  Headaches, Depression TIA   GI/Hepatic   Endo/Other    Renal/GU      Musculoskeletal  (+) Arthritis ,   Abdominal   Peds  Hematology   Anesthesia Other Findings Hx Brain Abscess  Reproductive/Obstetrics                             Anesthesia Physical Anesthesia Plan  ASA: III  Anesthesia Plan: General   Post-op Pain Management:    Induction: Intravenous  Airway Management Planned: Nasal ETT  Additional Equipment:   Intra-op Plan:   Post-operative Plan: Extubation in OR and Possible Post-op intubation/ventilation  Informed Consent: I have reviewed the patients History and Physical, chart, labs and discussed the procedure including the risks, benefits and alternatives for the proposed anesthesia with the patient or authorized representative who has indicated his/her understanding and acceptance.     Plan Discussed with: CRNA, Anesthesiologist and Surgeon  Anesthesia Plan Comments:         Anesthesia Quick Evaluation

## 2015-10-07 NOTE — Progress Notes (Signed)
Inpatient Rehabilitation  Received medical clearance and plan to admit patient to IP Rehab today.  Please call with questions.  Carmelia Roller., CCC/SLP Admission Coordinator  [place] [facility]  Cell [phone]

## 2015-10-07 NOTE — Progress Notes (Signed)
Late entry. Patient arrived on unit at 1444 in bed alert and oriented x 4 . Right crani site covered with nonadherent gauze. Oriented patient and family to call bell system and unit . Patient and family verbalized understanding of admission process. Continue with plan of  Care . Mliss Sax

## 2015-10-07 NOTE — Op Note (Signed)
OPERATIVE REPORT  Patient:            [name] Date of Birth:  Dec 14, 1965 MRN:                [id number]   DATE OF PROCEDURE:  10/07/2015  PREOPERATIVE DIAGNOSES: 1. History of Brain abscess 2. Chronic apical periodontitis 3. Dental caries  4. Retained root segment #18  POSTOPERATIVE DIAGNOSES: 1. History of Brain abscess 2. Chronic apical periodontitis 3. Dental caries  4. Retained root segment #18  OPERATIONS: 1. Extraction of tooth #18 with alveoloplasty   SURGEON: Lenn Cal, DDS  ASSISTANT: Camie Patience, (dental assistant)  ANESTHESIA: General anesthesia via oral endotracheal tube.  MEDICATIONS: 1. Ancef 2 g IV prior to invasive dental procedures. 2. Local anesthesia with a total utilization of 1 carpule each containing 34 mg of lidocaine with 0.017 mg of epinephrine as well as one carpule each containing 9 mg of bupivacaine with 0.009 mg of epinephrine.  SPECIMENS: There was one tooth that were discarded.  DRAINS: None  CULTURES: None  COMPLICATIONS: None   ESTIMATED BLOOD LOSS: Less than 10 mLs.  INTRAVENOUS FLUIDS: 400 mLs of Lactated ringers solution.  INDICATIONS: The patient was recently diagnosed with a brain abscess.  A dental consultation was then requested to evaluate for possible dental etiology for the brain abscess.  The patient was examined and treatment planned for extraction of tooth #18 with alveoloplasty under general anesthesia.  This treatment plan was formulated to decrease the risks and complications associated with dental infection from further affecting the patient's systemic health.  OPERATIVE FINDINGS: Patient was examined operating room number 6.  Tooth #18 was identified for extraction. The patient was noted be affected by chronic apical periodontitis, retained root segment, and dental caries.   DESCRIPTION OF PROCEDURE: Patient was brought to the main operating room number 6. Patient was then placed in the supine  position on the operating table. General anesthesia was then induced per the anesthesia team. The patient was then prepped and draped in the usual manner for dental medicine procedure. A timeout was performed. The patient was identified and procedures were verified. A throat pack was placed at this time. The oral cavity was then thoroughly examined with the findings noted above. The patient was then ready for dental medicine procedure as follows:  Local anesthesia was then administered sequentially with a total utilization of one carpules each containing 34 mg of lidocaine with 0.017 mg of epinephrine as well as 1 carpules  each containing 9 mg bupivacaine with 0.009 mg of epinephrine.  At this point time, the mandibular right quadrant was approached. The patient was given a mandibular left inferior alveolar nerve blocks and long buccal nerve block utilizing the bupivacaine with epinephrine. Further infiltration was then achieved utilizing the lidocaine with epinephrine. A 15 blade incision was then made from the distal of number #17 and extended to the mesial of #19.  A surgical flap was then carefully reflected. Tooth number 18 was then subluxated with a series straight elevators. Tooth #18 was then removed without complication. Alveoloplasty was then performed utilizing a rongeurs and bone file. The tissues were approximated and trimmed appropriately. The surgical sites were then irrigated with copious amounts of sterile saline. The mandibular left surgical site was then closed from the distal of  #17 and extended to the distal of #19 with 3-0 chromic gut suture in a continuous interrupted suture technique 1. 2 individual interrupted sutures are then placed to further  close the surgical site as needed.  At this point time, the entire mouth was irrigated with copious amounts of sterile saline. The patient was examined for complications, seeing none, the dental medicine procedure was deemed to be complete.  The throat pack was removed at this time. An oral airway was then placed at the request of the anesthesia team. A series of 4 x 4 gauze were placed in the mouth to aid hemostasis. The patient was then handed over to the anesthesia team for final disposition. After an appropriate amount of time, the patient was extubated and taken to the postanesthsia care unit in good condition. All counts were correct for the dental medicine procedure.  The patient be seen in 7-10 days for evaluation of healing and suture removal. The patient will then need to follow-up with a dentist of her choice for exam, radiographs, and discussion of other dental treatment needs.   Lenn Cal, DDS.

## 2015-10-07 NOTE — Progress Notes (Signed)
PRE-OPERATIVE NOTE:  10/07/2015 Kathryne Hitch [id number]  VITALS: BP 127/59 mmHg  Pulse 78  Temp(Src) 98.3 F (36.8 C) (Oral)  Resp 20  Ht 5\' 9"  (1.753 m)  Wt 250 lb (113.399 kg)  BMI 36.90 kg/m2  SpO2 95%  LMP 09/27/2011  Lab Results  Component Value Date   WBC 6.8 10/06/2015   HGB 11.1* 10/06/2015   HCT 32.3* 10/06/2015   MCV 93.6 10/06/2015   PLT 272 10/06/2015   BMET    Component Value Date/Time   NA 141 10/05/2015 1010   K 3.5 10/05/2015 1010   CL 109 10/05/2015 1010   CO2 27 10/05/2015 1010   GLUCOSE 118* 10/05/2015 1010   BUN 6 10/05/2015 1010   CREATININE 0.73 10/05/2015 1010   CALCIUM 8.5* 10/05/2015 1010   GFRNONAA >60 10/05/2015 1010   GFRAA >60 10/05/2015 1010    Lab Results  Component Value Date   INR 1.01 09/30/2015   No results found for: PTT   Paulett Doser presents for Extraction of tooth #18 with alveoloplasty as needed in the operating room with general anesthesia.   SUBJECTIVE: The patient denies any acute medical or dental changes and agrees to proceed with treatment as planned.  EXAM: No sign of acute dental changes.  ASSESSMENT: Patient is affected by chronic apical periodontitis, retained root segments, and dental caries.  PLAN: Patient agrees to proceed with treatment as planned in the operating room as previously discussed and accepts the risks, benefits, and complications of the proposed treatment. Patient is aware of the risk for bleeding, bruising, swelling, infection, pain, nerve damage, soft tissue damage, damage to adjacent teeth, sinus involvement, root tip fracture, mandible fracture, and the risks of complications associated with the anesthesia. Patient also is aware of the potential for other complications not mentioned above.   Lenn Cal, DDS

## 2015-10-07 NOTE — Discharge Summary (Signed)
Physician Discharge Summary   Patient ID: [name] MRN: [id number] DOB/AGE: August 02, 1966 50 y.o.  Admit date: 09/28/2015 Discharge date: 10/07/2015  Primary Care Physician:  Harle Battiest, MD  Discharge Diagnoses:    . headaches and left arm paresthesias due to brain abscess  . Headache . Brain abscess . 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 intermittent asthma  Consults: Neurosurgery, Dr Kathyrn Sheriff Dental medicine Dr Enrique Sack  Infectious disease, Dr. Linus Salmons Inpatient rehabilitation  Recommendations for Outpatient Follow-up:  1. Per infectious disease, continue ceftriaxone 2 g twice a day and oral Flagyl 500 mg 4 times a day for 6 weeks 2. Please repeat CBC/BMET at next visit, CBC, CMP weekly and faxed to infectious disease 3. For dental surgery, liquid diet today, advance as tolerated to soft solids   DIET: Liquid diet today    Allergies:   Allergies  Allergen Reactions  . Diclofenac Sodium Itching  . Meloxicam Itching     DISCHARGE MEDICATIONS: Current Discharge Medication List    CONTINUE these medications which have NOT CHANGED   Details  albuterol (PROVENTIL HFA;VENTOLIN HFA) 108 (90 BASE) MCG/ACT inhaler Inhale 2 puffs into the lungs every 6 (six) hours as needed for wheezing or shortness of breath. For wheezing    cholecalciferol (VITAMIN D) 1000 UNITS tablet Take 1,000 Units by mouth daily with breakfast.    cyclobenzaprine (FLEXERIL) 10 MG tablet Take 1 tablet (10 mg total) by mouth 2 (two) times daily as needed for muscle spasms. Qty: 20 tablet, Refills: 0    erythromycin ophthalmic ointment Place 1 application into the right eye daily.    methocarbamol (ROBAXIN) 500 MG tablet Take 1 tablet (500 mg total) by mouth 2 (two) times daily. Qty: 20 tablet, Refills: 0    Multiple Vitamin (MULTIVITAMIN WITH MINERALS) TABS tablet Take 1 tablet by mouth daily.    naproxen (NAPROSYN) 500 MG tablet Take 1 tablet (500 mg total) by mouth  2 (two) times daily. Qty: 30 tablet, Refills: 0    naproxen sodium (ANAPROX) 220 MG tablet Take 440 mg by mouth 2 (two) times daily as needed (pain).    oxyCODONE-acetaminophen (PERCOCET) 5-325 MG per tablet Take 1-2 tablets by mouth every 4 (four) hours as needed for severe pain. Qty: 20 tablet, Refills: 0    sodium chloride (MURO 128) 2 % ophthalmic solution Place 1 drop into the right eye 2 (two) times daily.    sodium chloride (MURO 128) 5 % ophthalmic ointment Place 1 application into the right eye at bedtime.    traMADol (ULTRAM) 50 MG tablet Take 1 tablet (50 mg total) by mouth every 6 (six) hours as needed. Qty: 15 tablet, Refills: 0        To continue at inpatient rehabilitation ceftriaxone 2 g every 12 hours for a total 6 weeks Flagyl 500 mg every 6 hours for 6 weeks Per neurosurgery, short course of steroids,             4q6 x 2d- completed 10/06/15  4q12 x 2d- started 10/07/15 2q12 x 2d 1q12 x 2d   Brief H and P: For complete details please refer to admission H and P, but in brief A pleasant healthy 50 year old African-American female with only known past medical history of intermittent asthma who was admitted to [facility] a few days ago with left arm paresthesia found to be due to left brain mass, further workup which included evaluation by neurology, neurosurgery and ID concluded with vision brain biopsy  on 10/03/2015, preliminary results consistent with abscess. ID has placed her on antibiotics. She is currently in neuro ICU for 1 day monitoring then she will be moved to the regular floor. Her possible source is left lower jaw tooth which has been chronically infected, dental surgery has been consulted as well.  Hospital Course:   Headaches and left arm paresthesias due to brain abscess - Continues to have headaches, added Percocet - Likely infectious in etiology, she does have chronic apical periodontitis and dental caries  in the left lower jaw. HIV negative, toxoplasmosis negative, cysticercosis negative, cryptococcus negative. Neurology, neurosurgery and ID following. Blood cultures negative echo stable. - Bedside LP failed, further workup as directed by neurology and neurosurgery.  - Patient underwent excisional brain biopsy by neurosurgeon Dr. Kathyrn Sheriff on 10/03/2015, continue aspirin, preliminary results consistent with abscess. Patient was placed on IV cefepime and Flagyl. Positive for strep viridans negative anaerobes.  - Per ID, Dr. Linus Salmons, use ceftriaxone 2 grams twice a day for 6 weeks with oral flagyl 500 mg 4 times a day.Weekly cbc, cmp to [facility] - Appreciate dental surgery Dr. Dorothyann Gibbs recommendations, tooth extraction #[id number] done on Friday 1/20 - Per neurosurgery, Dr. Kathyrn Sheriff, placed on short course of steroids, currently on 4mg  Q12 hrs x 2 days, taper as per instructions above   Palpitations, suspect related to anxiety, now resolved,  - normal sinus rhythm on EKG. Echo with EF 65-70%, grade 1 chronic and compensated diastolic dysfunction.   Intermittent Asthma  - well controlled, no dyspnea or wheezing   Left lower jaw chronic tooth infection - Dental surgery was consulted, tooth extraction on 1/20 at 7:30 AM.   Day of Discharge BP 137/78 mmHg  Pulse 80  Temp(Src) 98.3 F (36.8 C) (Oral)  Resp 16  Ht 5\' 9"  (1.753 m)  Wt 113.399 kg (250 lb)  BMI 36.90 kg/m2  SpO2 97%  LMP 09/27/2011  Physical Exam: General: Alert and awake oriented x3 not in any acute distress. HEENT: anicteric sclera, pupils reactive to light and accommodation CVS: S1-S2 clear no murmur rubs or gallops Chest: clear to auscultation bilaterally, no wheezing rales or rhonchi Abdomen: soft nontender, nondistended, normal bowel sounds Extremities: no cyanosis, clubbing or edema noted bilaterally Neuro: Cranial nerves II-XII intact, no focal neurological deficits   The results of significant diagnostics from this  hospitalization (including imaging, microbiology, ancillary and laboratory) are listed below for reference.    LAB RESULTS: Basic Metabolic Panel:  Recent Labs Lab 10/05/15 0414 10/05/15 1010  NA 141 141  K 3.8 3.5  CL 108 109  CO2 26 27  GLUCOSE 135* 118*  BUN 7 6  CREATININE 0.81 0.73  CALCIUM 8.3* 8.5*   Liver Function Tests:  Recent Labs Lab 10/01/15 1201  AST 19  ALT 12*  ALKPHOS 78  BILITOT 0.8  PROT 8.1  ALBUMIN 4.3   No results for input(s): LIPASE, AMYLASE in the last 168 hours. No results for input(s): AMMONIA in the last 168 hours. CBC:  Recent Labs Lab 10/05/15 0414 10/06/15 0332  WBC 7.5 6.8  HGB 11.0* 11.1*  HCT 32.3* 32.3*  MCV 95.3 93.6  PLT 257 272   Cardiac Enzymes: No results for input(s): CKTOTAL, CKMB, CKMBINDEX, TROPONINI in the last 168 hours. BNP: Invalid input(s): POCBNP CBG: No results for input(s): GLUCAP in the last 168 hours.  Significant Diagnostic Studies:  No results found.  2D ECHO: Study Conclusions  - Left ventricle: The cavity size was normal. There was moderate concentric  hypertrophy. Systolic function was vigorous. The estimated ejection fraction was in the range of 65% to 70%. Wall motion was normal; there were no regional wall motion abnormalities. Doppler parameters are consistent with abnormal left ventricular relaxation (grade 1 diastolic dysfunction). There was no evidence of elevated ventricular filling pressure by Doppler parameters. - Aortic valve: There was no regurgitation. - Aortic root: The aortic root was normal in size. - Left atrium: The atrium was normal in size. - Right ventricle: Systolic function was normal. - Tricuspid valve: There was trivial regurgitation. - Pulmonic valve: There was no regurgitation. - Pulmonary arteries: Systolic pressure was within the normal range. - Inferior vena cava: The vessel was normal in size. - Pericardium, extracardiac: There was no  pericardial effusion.  Disposition and Follow-up:    DISPOSITION: Inpatient rehabilitation   DISCHARGE FOLLOW-UP Follow-up Information    Follow up with [facility], NEELESH, C, MD In 2 weeks.   Specialty:  Neurosurgery   Why:  for hospital follow-up   Contact information:   [address] [place] [address] [place] [address] [phone]       Follow up with Teena Dunk, DDS. Schedule an appointment as soon as possible for a visit on 10/17/2015.   Specialty:  Dentistry   Why:  For suture removal, For wound re-check   Contact information:   [place] [address] [phone]       Follow up with Scharlene Gloss, MD. Schedule an appointment as soon as possible for a visit in 4 weeks.   Specialty:  Infectious Diseases   Why:  for hospital follow-up   Contact information:   [address] [place] [address] [phone]        Time spent on Discharge: 35 mins   Signed:   Syliva Mee M.D. [facility] 10/07/2015, 1:26 PM Pager: [phone]

## 2015-10-07 NOTE — Progress Notes (Addendum)
[facility] for Infectious Disease   Reason for visit: Follow up on brain abscess  Interval History: Positive for Strep viridans, negative anaerobes.  Improved.  Afebrile and less pain.  Tooth extraction done today.   Physical Exam: Constitutional:  Filed Vitals:   10/07/15 0845 10/07/15 0900  BP: 149/64 142/60  Pulse: 84 80  Temp:    Resp: 15 16   patient appears in NAD Respiratory: Normal respiratory effort; CTA B Cardiovascular: RRR  Review of Systems: Gastrointestinal: negative for diarrhea Neurological: positive for headaches, negative for weakness  Lab Results  Component Value Date   WBC 6.8 10/06/2015   HGB 11.1* 10/06/2015   HCT 32.3* 10/06/2015   MCV 93.6 10/06/2015   PLT 272 10/06/2015    Lab Results  Component Value Date   CREATININE 0.73 10/05/2015   BUN 6 10/05/2015   NA 141 10/05/2015   K 3.5 10/05/2015   CL 109 10/05/2015   CO2 27 10/05/2015    Lab Results  Component Value Date   ALT 12* 10/01/2015   AST 19 10/01/2015   ALKPHOS 78 10/01/2015     Microbiology: Recent Results (from the past 240 hour(s))  Culture, blood (routine x 2)     Status: None   Collection Time: 09/29/15 10:30 PM  Result Value Ref Range Status   Specimen Description BLOOD RIGHT HAND  Final   Special Requests BOTTLES DRAWN AEROBIC AND ANAEROBIC 6CC  Final   Culture   Final    NO GROWTH 5 DAYS Performed at [facility]    Report Status 10/05/2015 FINAL  Final  Culture, blood (routine x 2)     Status: None   Collection Time: 09/29/15 10:38 PM  Result Value Ref Range Status   Specimen Description BLOOD RIGHT ARM  Final   Special Requests BOTTLES DRAWN AEROBIC AND ANAEROBIC [place]  Final   Culture   Final    NO GROWTH 5 DAYS Performed at [facility]    Report Status 10/05/2015 FINAL  Final  Surgical pcr screen     Status: Abnormal   Collection Time: 10/03/15  1:22 PM  Result Value Ref Range Status   MRSA, PCR NEGATIVE NEGATIVE Final   Staphylococcus aureus POSITIVE (A) NEGATIVE Final    Comment:        The Xpert SA Assay (FDA approved for NASAL specimens in patients over 54 years of age), is one component of a comprehensive surveillance program.  Test performance has been validated by [facility] for patients greater than or equal to 16 year old. It is not intended to diagnose infection nor to guide or monitor treatment.   Anaerobic culture     Status: None (Preliminary result)   Collection Time: 10/03/15  6:03 PM  Result Value Ref Range Status   Specimen Description WOUND  Final   Special Requests SWAB OF TUMOR RIGHT PARIETAL LESION  Final   Gram Stain   Final    MODERATE WBC PRESENT,BOTH PMN AND MONONUCLEAR NO SQUAMOUS EPITHELIAL CELLS SEEN ABUNDANT GRAM NEGATIVE RODS FEW GRAM POSITIVE COCCI IN PAIRS Gram Stain Report Called to,Read Back By and Verified With: Gram Stain Report Called to,Read Back By and Verified With: S HOCUTT RN [id number] 10/03/15 BY A BROWNING Performed at [facility]    Culture   Final    NO ANAEROBES ISOLATED; CULTURE IN PROGRESS FOR 5 DAYS Performed at [facility]    Report Status PENDING  Incomplete  Wound culture  Status: None   Collection Time: 10/03/15  6:03 PM  Result Value Ref Range Status   Specimen Description WOUND  Final   Special Requests SWAB OF TUMOR RIGHT PARIETAL LESION  Final   Gram Stain   Final    MODERATE WBC PRESENT,BOTH PMN AND MONONUCLEAR NO SQUAMOUS EPITHELIAL CELLS SEEN ABUNDANT GRAM NEGATIVE RODS FEW GRAM POSITIVE COCCI IN PAIRS Performed at [facility]    Culture   Final    MODERATE VIRIDANS STREPTOCOCCUS Performed at [facility]    Report Status 10/06/2015 FINAL  Final  Gram stain     Status: None   Collection Time: 10/03/15  6:03 PM  Result Value Ref Range Status   Specimen Description WOUND  Final   Special Requests SWAB OF TUMOR RIGHT PARIETAL LESION  Final   Gram Stain   Final    MODERATE WBC PRESENT,BOTH PMN  AND MONONUCLEAR ABUNDANT GRAM NEGATIVE RODS FEW GRAM POSITIVE COCCI IN PAIRS Gram Stain Report Called to,Read Back By and Verified With: S HOCUTT RN [id number] 10/03/15 A BROWNING    Report Status 10/03/2015 FINAL  Final  Gram stain     Status: None   Collection Time: 10/03/15  6:03 PM  Result Value Ref Range Status   Specimen Description TISSUE  Final   Special Requests TUMOR RIGHT PARIETAL MASS  Final   Gram Stain   Final    FEW WBC PRESENT,BOTH PMN AND MONONUCLEAR NO ORGANISMS SEEN    Report Status 10/03/2015 FINAL  Final  Tissue culture     Status: None (Preliminary result)   Collection Time: 10/03/15  6:07 PM  Result Value Ref Range Status   Specimen Description TISSUE  Final   Special Requests RIGHT PARIETAL MASS  Final   Gram Stain   Final    FEW WBC PRESENT,BOTH PMN AND MONONUCLEAR NO ORGANISMS SEEN Performed at [facility] Performed at [facility]    Culture   Final    NO GROWTH 1 DAY Performed at [facility]    Report Status PENDING  Incomplete    Impression/Plan:  1. Brain abscess - Strep viridans, likely source tooth.  No anaerobes.  Will stop flagyl and use ceftriaxone 2 grams twice a day for 6 weeks with oral flagyl 500 mg 4 times a day.   Weekly cbc, cmp to [facility] Antibiotics per home health protocol We will arrange follow up in [facility] in 3-4 weeks.    2. Dental pain - likely source of #1.   I will sign off, please call with questions.

## 2015-10-07 NOTE — Anesthesia Procedure Notes (Signed)
Procedure Name: Intubation Date/Time: 10/07/2015 7:41 AM Performed by: Scheryl Darter Pre-anesthesia Checklist: Patient identified, Emergency Drugs available, Suction available, Patient being monitored and Timeout performed Patient Re-evaluated:Patient Re-evaluated prior to inductionOxygen Delivery Method: Circle system utilized Preoxygenation: Pre-oxygenation with 100% oxygen Intubation Type: IV induction Ventilation: Mask ventilation without difficulty Laryngoscope Size: Miller and 2 Grade View: Grade I Tube type: Oral Rae Tube size: 7.0 mm Number of attempts: 1 Airway Equipment and Method: Stylet Placement Confirmation: ETT inserted through vocal cords under direct vision,  positive ETCO2 and breath sounds checked- equal and bilateral Secured at: 21 cm Tube secured with: Tape Dental Injury: Teeth and Oropharynx as per pre-operative assessment

## 2015-10-07 NOTE — Progress Notes (Signed)
Ankit Lorie Phenix, MD Physician Signed Physical Medicine and Rehabilitation Consult Note 10/05/2015 8:14 AM  Related encounter: ED to Hosp-Admission (Discharged) from 09/28/2015 in [place] All Collapse All        Physical Medicine and Rehabilitation Consult Reason for Consult: Brain abscess Referring Physician: [facility]   HPI: [name] is a 50 y.o. right handed female with history of asthma. Patient lives alone with 2 young children and independent prior to admission. She plans to stay with her sister on discharge who works day shift as a Radio producer, but notes she has another sister who will stay with her during the day. One level home with one-step entry. Presented 10/02/2015 with sudden onset of headache and left-sided paresthesias and mild weakness. CT and MRI of the brain demonstrated a ring-enhancing right postcentral lesions suspicious for abscess. She did have a history of recent tooth infection. Underwent stereotactic right frontoparietal craniotomy for resection of lesion 10/03/2015 per Dr. Kathyrn Sheriff. Gram stain with gram-negative rods but also GPC in pairs. Infectious disease consult at presently on Maxipime with ?duration. Keppra for seizure prophylaxis. Tolerating a regular diet. Physical therapy evaluation completed 10/04/2015 with recommendations of physical medicine rehabilitation consult.  Review of Systems  Constitutional: Negative for fever and chills.  HENT: Negative for hearing loss.  Eyes: Negative for blurred vision and double vision.  Respiratory: Negative for cough and shortness of breath.  Cardiovascular: Negative for chest pain, palpitations and leg swelling.  Gastrointestinal: Positive for constipation. Negative for nausea and vomiting.  Genitourinary: Negative for dysuria and hematuria.  Musculoskeletal: Positive for myalgias.  Skin: Negative for rash.  Neurological: Positive for dizziness, weakness and  headaches. Negative for seizures.  All other systems reviewed and are negative.  Past Medical History  Diagnosis Date  . Asthma   . Rotator cuff tear   . Perimenopausal   . Slipped intervertebral disc   . Meniscus tear   . Chronic knee pain     right  . Rheumatoid arthritis [facility])    Past Surgical History  Procedure Laterality Date  . Cesarean section    . Oophorectomy    . Tubal ligation    . Shoulder surgery    . Craniotomy Right 10/03/2015    Procedure: Stereotactic right frontoparietal craniotomy for excisional biopsy of lesion ; Surgeon: Consuella Lose, MD; Location: [place] NEURO ORS; Service: Neurosurgery; Laterality: Right;  . Application of cranial navigation Right 10/03/2015    Procedure: APPLICATION OF CRANIAL NAVIGATION; Surgeon: Consuella Lose, MD; Location: [place] NEURO ORS; Service: Neurosurgery; Laterality: Right;   Family History  Problem Relation Age of Onset  . Cancer Mother     colon cancer  . Cancer Father     brain cancer  . Alcohol abuse Father   . Diabetes Father    Social History:  reports that she has never smoked. She has never used smokeless tobacco. She reports that she does not drink alcohol or use illicit drugs. Allergies:  Allergies  Allergen Reactions  . Diclofenac Sodium Itching  . Meloxicam Itching   Medications Prior to Admission  Medication Sig Dispense Refill  . albuterol (PROVENTIL HFA;VENTOLIN HFA) 108 (90 BASE) MCG/ACT inhaler Inhale 2 puffs into the lungs every 6 (six) hours as needed for wheezing or shortness of breath. For wheezing    . cholecalciferol (VITAMIN D) 1000 UNITS tablet Take 1,000 Units by mouth daily with breakfast.    . cyclobenzaprine (FLEXERIL) 10 MG tablet Take 1  tablet (10 mg total) by mouth 2 (two) times daily as needed for muscle spasms. 20 tablet 0  . erythromycin ophthalmic ointment  Place 1 application into the right eye daily.    . methocarbamol (ROBAXIN) 500 MG tablet Take 1 tablet (500 mg total) by mouth 2 (two) times daily. 20 tablet 0  . Multiple Vitamin (MULTIVITAMIN WITH MINERALS) TABS tablet Take 1 tablet by mouth daily.    . naproxen (NAPROSYN) 500 MG tablet Take 1 tablet (500 mg total) by mouth 2 (two) times daily. 30 tablet 0  . naproxen sodium (ANAPROX) 220 MG tablet Take 440 mg by mouth 2 (two) times daily as needed (pain).    Marland Kitchen oxyCODONE-acetaminophen (PERCOCET) 5-325 MG per tablet Take 1-2 tablets by mouth every 4 (four) hours as needed for severe pain. 20 tablet 0  . sodium chloride (MURO 128) 2 % ophthalmic solution Place 1 drop into the right eye 2 (two) times daily.    . sodium chloride (MURO 128) 5 % ophthalmic ointment Place 1 application into the right eye at bedtime.    . traMADol (ULTRAM) 50 MG tablet Take 1 tablet (50 mg total) by mouth every 6 (six) hours as needed. 15 tablet 0    Home: Home Living Family/patient expects to be discharged to:: Private residence Living Arrangements: Children, Other relatives Available Help at Discharge: Family, Available 24 hours/day Type of Home: House Home Access: Stairs to enter [employer] of Steps: 1 Home Layout: One level Alternate Level Stairs-Number of Steps: flight Bathroom Shower/Tub: Tub/shower unit, Multimedia programmer: Programmer, systems: Yes Home Equipment: None  Functional History: Prior Function Level of Independence: Independent Comments: works Functional Status:  Mobility: Bed Mobility Overal bed mobility: Needs Assistance Bed Mobility: Supine to Sit Supine to sit: Min assist, HOB elevated Sit to supine: (pt on BSC with RN on PT arrival) General bed mobility comments: A with L LE. Transfers Overall transfer level: Needs assistance Equipment used: 2 person hand held assist Transfers: Sit to/from Stand,  Stand Pivot Transfers Sit to Stand: Min assist, +2 physical assistance Stand pivot transfers: Supervision General transfer comment: pt needs cues for positioning of Bil LEs and upright posture as pt tends to lean to L side. L bias Ambulation/Gait Ambulation/Gait assistance: Min assist, +2 physical assistance Ambulation Distance (Feet): 12 Feet (x2) Assistive device: 2 person hand held assist Gait Pattern/deviations: Step-to pattern, Decreased step length - left, Decreased stance time - left, Decreased dorsiflexion - left, Shuffle, Drifts right/left General Gait Details: pt tends to lean to L side and needs cueing for attending to objects and doorway on L side as pt left her L UE on outside of door frame and was continuing to try to walk through door.     ADL: ADL Overall ADL's : Needs assistance/impaired Eating/Feeding: Set up Grooming: Set up Upper Body Bathing: Minimal assitance Lower Body Bathing: Moderate assistance Upper Body Dressing : Minimal assistance Lower Body Dressing: Moderate assistance Toilet Transfer: +2 for physical assistance, Minimal assistance Toileting - Clothing Manipulation Details (indicate cue type and reason): foley Functional mobility during ADLs: Minimal assistance, +2 for physical assistance General ADL Comments: Pt ambulating to bathroom. Running into L doorframe of bathroom. leaving LUE outside of bathroom adn required phusical cues to bring LUE into bathroom  Cognition: Cognition Overall Cognitive Status: Impaired/Different from baseline Orientation Level: Oriented X4 Cognition Arousal/Alertness: Awake/alert Behavior During Therapy: Flat affect Overall Cognitive Status: Impaired/Different from baseline Area of Impairment: Attention, Safety/judgement, Awareness, Problem solving Current  Attention Level: Alternating Following Commands: Follows one step commands with increased time Safety/Judgement: Decreased awareness of deficits Awareness:  Emergent Problem Solving: Slow processing, Difficulty sequencing, Requires verbal cues, Requires tactile cues General Comments: R bias during activities  Blood pressure 125/55, pulse 70, temperature 97.8 F (36.6 C), temperature source Oral, resp. rate 16, height 5\' 9"  (1.753 m), weight 113.399 kg (250 lb), last menstrual period 09/27/2011, SpO2 95 %. Physical Exam  Vitals reviewed. Constitutional: She is oriented to person, place, and time. She appears well-developed and well-nourished.  HENT:  Head: Normocephalic.  Right Ear: External ear normal.  Left Ear: External ear normal.  Eyes: Conjunctivae and EOM are normal.  Neck: Normal range of motion. Neck supple. No thyromegaly present.  Cardiovascular: Normal rate and regular rhythm.  Respiratory: Effort normal and breath sounds normal. No respiratory distress.  GI: Soft. Bowel sounds are normal. She exhibits no distension.  Musculoskeletal:  PROM No tenderness in extremities  Neurological: She is alert and oriented to person, place, and time.  Follows full commands No sensation distal to left knee Motor: R UE/RLE: 5/5 proximal distal L UE: 3+/5 proximal distal LLE: 3-5 proximal DTRs increased on left side versus right-sided  Skin: Skin is warm and dry.  Psychiatric: Her behavior is normal. Judgment normal.  Flat affect     Lab Results Last 24 Hours    Results for orders placed or performed during the hospital encounter of 09/28/15 (from the past 24 hour(s))  CBC Status: Abnormal   Collection Time: 10/05/15 4:14 AM  Result Value Ref Range   WBC 7.5 4.0 - 10.5 K/uL   RBC 3.39 (L) 3.87 - 5.11 MIL/uL   Hemoglobin 11.0 (L) 12.0 - 15.0 g/dL   HCT 32.3 (L) 36.0 - 46.0 %   MCV 95.3 78.0 - 100.0 fL   MCH 32.4 26.0 - 34.0 pg   MCHC 34.1 30.0 - 36.0 g/dL   RDW 12.4 11.5 - 15.5 %   Platelets 257 150 - 400 K/uL  Basic metabolic panel Status: Abnormal   Collection Time:  10/05/15 4:14 AM  Result Value Ref Range   Sodium 141 135 - 145 mmol/L   Potassium 3.8 3.5 - 5.1 mmol/L   Chloride 108 101 - 111 mmol/L   CO2 26 22 - 32 mmol/L   Glucose, Bld 135 (H) 65 - 99 mg/dL   BUN 7 6 - 20 mg/dL   Creatinine, Ser 0.81 0.44 - 1.00 mg/dL   Calcium 8.3 (L) 8.9 - 10.3 mg/dL   GFR calc non Af Amer >60 >60 mL/min   GFR calc Af Amer >60 >60 mL/min   Anion gap 7 5 - 15      Imaging Results (Last 48 hours)    Mr Jeri Cos Contrast  10/03/2015 CLINICAL DATA: 50 year old female with ring-enhancing right parietal brain lesion. Stereotactic surgical planning for biopsy. Initial encounter. EXAM: MRI HEAD WITH CONTRAST TECHNIQUE: Multiplanar, multiecho pulse sequences of the brain and surrounding structures were obtained with intravenous contrast. COMPARISON: Brain MRI and head CT 09/28/2015 CONTRAST: 6mL MULTIHANCE GADOBENATE DIMEGLUMINE 529 MG/ML IV SOLN FINDINGS: Cystic right parietal lesion with rim enhancement has progressed in size over the last 5 days, now measuring up to 31 mm in diameter (previously up to 15 mm). The peripheral rim enhancement has thickened and is more indistinct. Surrounding FLAIR hyper intensity in a vasogenic edema pattern with mild regional mass effect has also progressed. Mild mass effect on the atrium of the right lateral ventricle. Trace  associated blood products or mineralization now. The internal portion of the lesion was restricted on diffusion previously and had a dark T2 rim. The abnormal enhancement extends to the posterior superior right parietal lobe surface and there is now mild associated dural thickening and hyper enhancement (series 4, image 13). No leptomeningeal enhancement. No ventricular enhancement. No ventriculomegaly. No intraventricular debris. No other abnormal intracranial enhancement. No new lesion. No diffusion-weighted imaging today. Basilar cisterns remain patent. IMPRESSION:  Solitary right parietal lesion and surrounding edema have roughly doubled in size over the past 5 days. Small area of overlying dural thickening and enhancement now. Mild regional mass effect. Top differential considerations remain brain abscess, cystic neoplasm or metastasis. Electronically Signed By: Genevie Ann M.D. On: 10/03/2015 12:47     Assessment/Plan: Diagnosis: Brain abscess Labs and images independently reviewed. Records reviewed and summated above.  1. Does the need for close, 24 hr/day medical supervision in concert with the patient's rehab needs make it unreasonable for this patient to be served in a less intensive setting? Yes 2. Co-Morbidities requiring supervision/potential complications: asthma (monitor with increase physical exertion), ABLA (transfuse if necessary to ensure appropriate perfusion for increased activity tolerance) 3. Due to safety, skin/wound care, disease management, medication administration, pain management and patient education, does the patient require 24 hr/day rehab nursing? Yes 4. Does the patient require coordinated care of a physician, rehab nurse, PT (1-2 hrs/day, 5 days/week) and OT (1-2 hrs/day, 5 days/week) to address physical and functional deficits in the context of the above medical diagnosis(es)? Yes Addressing deficits in the following areas: endurance, locomotion, strength, transferring, bathing, dressing, grooming, toileting and psychosocial support 5. Can the patient actively participate in an intensive therapy program of at least 3 hrs of therapy per day at least 5 days per week? Potentially 6. The potential for patient to make measurable gains while on inpatient rehab is excellent 7. Anticipated functional outcomes upon discharge from inpatient rehab are modified independent and supervision with PT, modified independent with OT, n/a with SLP. 8. Estimated rehab length of stay to reach the above functional goals is: 10-12 days. 9. Does the  patient have adequate social supports and living environment to accommodate these discharge functional goals? Yes 10. Anticipated D/C setting: Home 11. Anticipated post D/C treatments: HH therapy and Home excercise program 12. Overall Rehab/Functional Prognosis: excellent  RECOMMENDATIONS: This patient's condition is appropriate for continued rehabilitative care in the following setting: CIR once pt demonstartes ability to tolerate 3 hours therapy/day. Patient has agreed to participate in recommended program. Potentially Note that insurance prior authorization may be required for reimbursement for recommended care.  Comment: Rehab Admissions Coordinator to follow up.  Delice Lesch, MD 10/05/2015       Revision History     Date/Time User Provider Type Action   10/05/2015 3:01 PM Ankit Lorie Phenix, MD Physician Sign   10/05/2015 8:29 AM Cathlyn Parsons, PA-C Physician Assistant Pend   View Details Report       Routing History     Date/Time From To Method   10/05/2015 3:01 PM Ankit Lorie Phenix, MD Ankit Lorie Phenix, MD In [facility]   10/05/2015 3:01 PM Ankit Lorie Phenix, MD Harle Battiest, MD In [facility]

## 2015-10-07 NOTE — Progress Notes (Signed)
Discussed PICC insertion including risks, benefits, and alternatives. Requests waiting until tomorrow after her therapies at around 4 PM. Education materials given to patient regarding insertion and after care.

## 2015-10-07 NOTE — Transfer of Care (Signed)
Immediate Anesthesia Transfer of Care Note  Patient: [name]  Procedure(s) Performed: Procedure(s): Extraction of tooth #18 with alveoloplasty (N/A)  Patient Location: PACU  Anesthesia Type:General  Level of Consciousness: awake, alert  and oriented  Airway & Oxygen Therapy: Patient Spontanous Breathing and Patient connected to nasal cannula oxygen  Post-op Assessment: Report given to RN, Post -op Vital signs reviewed and stable and Patient moving all extremities  Post vital signs: Reviewed and stable  Last Vitals:  Filed Vitals:   10/06/15 2154 10/07/15 0830  BP: 122/61 152/67  Pulse: 77 86  Temp: 36.8 C 36.5 C  Resp: 18 14    Complications: No apparent anesthesia complications

## 2015-10-07 NOTE — Interval H&P Note (Signed)
Matricia Costa was admitted today to Inpatient Rehabilitation with the diagnosis of brain abscess.  The patient's history has been reviewed, patient examined, and there is no change in status.  Patient continues to be appropriate for intensive inpatient rehabilitation.  I have reviewed the patient's chart and labs.  Questions were answered to the patient's satisfaction. The PAPE has been reviewed and assessment remains appropriate.  [name] T 10/07/2015, 3:29 PM

## 2015-10-07 NOTE — Progress Notes (Signed)
Occupational Therapy Treatment Patient Details Name: [name] MRN: [id number] DOB: [birth year] Today's Date: 10/07/2015    History of present illness pt presents post R Frontoparietal Crani with Lesion Biopsy after having headache for 4 days with MRI showing 1cm lesion.  Planned tooth extraction 1/20 as ?source of infection/brain abscess. pt with hx of RA.   OT comments  Pt very motivated this session. Pt visually scanning the room for safety with min verbal cues this session. Pt able to ambulated to toilet for toileting and then stand at sink for functional tasks with steady assistance. Pt seated in recliner chair at end of session with call bell and all needed items within reach.   Follow Up Recommendations  CIR;Supervision/Assistance - 24 hour    Equipment Recommendations  3 in 1 bedside comode    Recommendations for Other Services Rehab consult    Precautions / Restrictions Precautions Precautions: Fall Restrictions Weight Bearing Restrictions: No       Mobility Bed Mobility Overal bed mobility: Needs Assistance Bed Mobility: Supine to Sit     Supine to sit: HOB elevated;Supervision        Transfers Overall transfer level: Needs assistance Equipment used: Rolling walker (2 wheeled) Transfers: Sit to/from Stand Sit to Stand: Min assist              Balance Overall balance assessment: Needs assistance Sitting-balance support: No upper extremity supported;Feet supported Sitting balance-Leahy Scale: Fair       Standing balance-Leahy Scale: Fair                     ADL Overall ADL's : Needs assistance/impaired     Grooming: Min Dispensing optician: Minimal assistance;Ambulation;RW   Toileting- Clothing Manipulation and Hygiene: Minimal assistance;Sit to/from stand         General ADL Comments: Pt ambulating to bathroom with RW and side stepping into doorway with steady assistance. Steady assist and min  cues for safety for toilet transfers. Steady assist while standing to manage clothing . Pt standing at sink side and locating self care items on L side  to wash face with steady assist for balance. Pt utilizing L hand to wash face. Pt removing flower from vase with L hand and emptying water from vase with B UEs for safety. Pt returning to sit in recliner chair at end of session.                Cognition     Overall Cognitive Status: Impaired/Different from baseline Area of Impairment: Attention;Safety/judgement;Awareness;Problem solving   Current Attention Level: Alternating    Following Commands: Follows one step commands with increased time;Follows multi-step commands inconsistently;Follows multi-step commands with increased time Safety/Judgement: Decreased awareness of deficits;Decreased awareness of safety Awareness: Intellectual Problem Solving: Slow processing;Difficulty sequencing;Requires verbal cues;Requires tactile cues                   Pertinent Vitals/ Pain       Pain Assessment: No/denies pain         Frequency Min 2X/week     Progress Toward Goals  OT Goals(current goals can now be found in the care plan section)  Progress towards OT goals: Progressing toward goals     Plan Discharge plan remains appropriate;Frequency remains appropriate       End of Session Equipment Utilized During Treatment: Rolling walker   Activity  Tolerance Patient tolerated treatment well   Patient Left in chair;with call bell/phone within reach;with chair alarm set;with family/visitor present   Nurse Communication Mobility status        Time: [id number] OT Time Calculation (min): 30 min  Charges: OT General Charges $OT Visit: 1 Procedure OT Treatments $Self Care/Home Management : 23-37 mins (30)  Pittman, Kendyll Huettner L, MS, OTR/L 10/07/2015, 12:07 PM

## 2015-10-08 ENCOUNTER — Inpatient Hospital Stay (HOSPITAL_COMMUNITY): Payer: Medicaid Other | Admitting: Physical Therapy

## 2015-10-08 ENCOUNTER — Inpatient Hospital Stay (HOSPITAL_COMMUNITY): Payer: Self-pay | Admitting: Occupational Therapy

## 2015-10-08 DIAGNOSIS — J4520 Mild intermittent asthma, uncomplicated: Secondary | ICD-10-CM

## 2015-10-08 LAB — ANAEROBIC CULTURE

## 2015-10-08 NOTE — Evaluation (Signed)
Occupational Therapy Assessment and Plan  Patient Details  Name: [name] MRN: [id number] Date of Birth: [birth year]  OT Diagnosis: cognitive deficits, hemiplegia affecting non-dominant side and muscle weakness (generalized) Rehab Potential: Rehab Potential (ACUTE ONLY): Good ELOS: 7 days   Today's Date: 10/08/2015 OT Individual Time: 1300-1431 OT Individual Time Calculation (min): 91 min     Problem List:  Patient Active Problem List   Diagnosis Date Noted  . Left hemiparesis ([place]) 10/07/2015  . Abscessed tooth 10/07/2015  . Asthma   . Acute blood loss anemia   . 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 Brain lesion 09/29/2015  . TIA (transient ischemic attack) 09/28/2015  . Headache 09/28/2015  . Adjustment disorder with mixed anxiety and depressed mood 07/02/2013  . Depressive disorder 07/03/2012    Past Medical History:  Past Medical History  Diagnosis Date  . Asthma   . Rotator cuff tear   . Perimenopausal   . Slipped intervertebral disc   . Meniscus tear   . Chronic knee pain     right  . Rheumatoid arthritis [facility])    Past Surgical History:  Past Surgical History  Procedure Laterality Date  . Cesarean section    . Oophorectomy    . Tubal ligation    . Shoulder surgery    . Craniotomy Right 10/03/2015    Procedure: Stereotactic right frontoparietal craniotomy for excisional biopsy of lesion ;  Surgeon: Consuella Lose, MD;  Location: [facility];  Service: Neurosurgery;  Laterality: Right;  . Application of cranial navigation Right 10/03/2015    Procedure: APPLICATION OF CRANIAL NAVIGATION;  Surgeon: Consuella Lose, MD;  Location: [place] [facility];  Service: Neurosurgery;  Laterality: Right;    Assessment & Plan Clinical Impression: Patient is a 50 y.o. year old female with recent admission to the hospital on 10/02/2015 with sudden onset of headache and left-sided paresthesias and mild weakness. CT and MRI of the brain demonstrated a ring-enhancing right postcentral  lesions suspicious for abscess. Underwent stereotactic right frontoparietal craniotomy for resection of lesion 10/03/2015 per Dr. Kathyrn Sheriff. Decadron protocol and taper   Patient transferred to CIR on 10/07/2015 .    Patient currently requires min with basic self-care skills and IADL secondary to muscle weakness, impaired timing and sequencing and unbalanced muscle activation, decreased attention to left, decreased problem solving and decreased memory and decreased standing balance, hemiplegia and decreased balance strategies.  Prior to hospitalization, patient could complete ADLs and IADLs with independent .  Patient will benefit from skilled intervention to decrease level of assist with basic self-care skills, increase independence with basic self-care skills and increase level of independence with iADL prior to discharge home with her sister.  Anticipate patient will reach modified independent to independent level at discharge and need follow up outpatient.  OT - End of Session Activity Tolerance: Tolerates 30+ min activity without fatigue Endurance Deficit: No OT Assessment Rehab Potential (ACUTE ONLY): Good OT Patient demonstrates impairments in the following area(s): Balance;Cognition;Motor;Safety;Perception OT Basic ADL's Functional Problem(s): Grooming;Bathing;Dressing;Toileting OT Advanced ADL's Functional Problem(s): Simple Meal Preparation;Laundry;Light Housekeeping OT Transfers Functional Problem(s): Toilet;Tub/Shower OT Additional Impairment(s): Fuctional Use of Upper Extremity OT Plan OT Intensity: Minimum of 1-2 x/day, 45 to 90 minutes OT Frequency: 5 out of 7 days OT Duration/Estimated Length of Stay: 7 days OT Treatment/Interventions: Medical illustrator training;Community reintegration;Discharge planning;Functional mobility training;DME/adaptive equipment instruction;Neuromuscular re-education;Self Care/advanced ADL retraining;Therapeutic Exercise;UE/LE Strength taining/ROM;UE/LE  Coordination activities;Patient/family education;Therapeutic Activities OT Self Feeding Anticipated Outcome(s): independent OT Basic Self-Care Anticipated Outcome(s): modified independent OT Toileting  Anticipated Outcome(s): independent OT Bathroom Transfers Anticipated Outcome(s): independent OT Recommendation Patient destination: Home Follow Up Recommendations: Outpatient OT Equipment Recommended: To be determined   Skilled Therapeutic Intervention Began education on FM coordination tasks and issued therapy putty and handout during session.  Pt with decreased strength, speed, and coordination noted in the left hand.  Min assist to min guard assist for ambulation to and from the therapy gym.  Noted when transferring from out of the bathroom, pt had difficulty navigating around the bathroom door in order to walk out of the room.  Pt with no noted field deficit with gross visual testing but does exhibit a slight spatial perceptual issue.  Will continue to monitor in therapy.  Slight swelling noted in the left hand and digits compare to the right.  Pt left in wheelchair in room for next session with call button and phone within reach.    OT Evaluation Precautions/Restrictions  Precautions Precautions: Fall Restrictions Weight Bearing Restrictions: No  Vital Signs Therapy Vitals Temp: 98.4 F (36.9 C) Temp Source: Oral Pulse Rate: 67 Resp: 18 BP: (!) 143/83 mmHg Patient Position (if appropriate): Sitting Oxygen Therapy SpO2: 100 % O2 Device: Not Delivered Pain Pain Assessment Pain Assessment: No/denies pain Pain Score: 1  Home Living/Prior Functioning Home Living Available Help at Discharge: Family, Available 24 hours/day Type of Home: House Home Access: Stairs to enter Technical brewer of Steps: 1 Home Layout: One level Bathroom Shower/Tub: Multimedia programmer: Standard Bathroom Accessibility: Yes IADL History Homemaking Responsibilities: Yes Meal Prep  Responsibility: Primary Laundry Responsibility: Primary Cleaning Responsibility: Primary Bill Paying/Finance Responsibility: Primary Shopping Responsibility: Primary Child Care Responsibility: Primary Current License: Yes Mode of Transportation: Other (comment) Thom Chimes) Occupation: Full time employment Prior Function Level of Independence: Independent with basic ADLs Driving: Yes Vocation: Full time employment ADL  See Function Section of chart  Vision/Perception  Vision- History Baseline Vision/History: Wears glasses Wears Glasses: Reading only Patient Visual Report: No change from baseline Vision- Assessment Vision Assessment?: No apparent visual deficits  Cognition Overall Cognitive Status: Impaired/Different from baseline Arousal/Alertness: Awake/alert Orientation Level: Person;Place;Situation Person: Oriented Place: Oriented Situation: Oriented Year: 2017 Month: January Day of Week: Correct Memory: Appears intact Immediate Memory Recall: Sock;Blue;Bed Memory Recall: Sock;Blue;Bed Memory Recall Sock: With Cue Memory Recall Blue: With Cue Memory Recall Bed: With Cue Attention: Sustained;Selective Sustained Attention: Appears intact Selective Attention: Appears intact Awareness: Appears intact Problem Solving: Appears intact Safety/Judgment: Appears intact Comments: Pt reports having to concentrate harder on problems and remembering things such as word recall, when tested.   Sensation Sensation Light Touch: Impaired Detail Light Touch Impaired Details: Impaired LUE Stereognosis: Appears Intact Hot/Cold: Appears Intact Proprioception: Appears Intact Additional Comments: Pt with minor light touch deficits in the left hand compared to the right hand.   Coordination Gross Motor Movements are Fluid and Coordinated: No Fine Motor Movements are Fluid and Coordinated: No Coordination and Movement Description: Pt with slower speed on the left compared to the right.   9 Hole Peg Test: 22 seconds on the left           39 seconds on the right Motor  Motor Motor: Hemiplegia Motor - Skilled Clinical Observations: mild weakness and hemiparesis in the LUE and LLE  Mobility  Transfers Transfers: Sit to Stand;Stand to Sit Sit to Stand: 4: Min guard;From toilet;Without upper extremity assist Stand to Sit: 4: Min guard;Without upper extremity assist;To toilet  Trunk/Postural Assessment  Cervical Assessment Cervical Assessment: Within Functional Limits Thoracic Assessment Thoracic Assessment:  Within Functional Limits Lumbar Assessment Lumbar Assessment: Within Functional Limits Postural Control Postural Control: Within Functional Limits  Balance Balance Balance Assessed: Yes Dynamic Sitting Balance Dynamic Sitting - Balance Support: Right upper extremity supported;No upper extremity supported Dynamic Sitting - Level of Assistance: 5: Stand by assistance Static Standing Balance Static Standing - Balance Support: No upper extremity supported Static Standing - Level of Assistance: 5: Stand by assistance Dynamic Standing Balance Dynamic Standing - Balance Support: No upper extremity supported Dynamic Standing - Level of Assistance: 4: Min assist Extremity/Trunk Assessment RUE Assessment RUE Assessment: Within Functional Limits LUE Assessment LUE Assessment: Exceptions to WFL (grip strength 41 lbs compared to 60 on the right.  Decreased overall strength at a 3+/5 level throughout.  )   See Function Navigator for Current Functional Status.   Refer to Care Plan for Long Term Goals  Recommendations for other services: None  Discharge Criteria: Patient will be discharged from OT if patient refuses treatment 3 consecutive times without medical reason, if treatment goals not met, if there is a change in medical status, if patient makes no progress towards goals or if patient is discharged from hospital.  The above assessment, treatment plan, treatment  alternatives and goals were discussed and mutually agreed upon: by patient  London Nonaka OTR/L 10/08/2015, 4:13 PM

## 2015-10-08 NOTE — Progress Notes (Signed)
[facility] PHYSICAL MEDICINE & REHABILITATION     PROGRESS NOTE    Subjective/Complaints: Had some wheezing last night. Better with nebs. Feels that it started to worsen before she came down to rehab. Denies anxiety  Objective: Vital Signs: Blood pressure 103/49, pulse 60, temperature 98.2 F (36.8 C), temperature source Oral, resp. rate 18, height 5\' 9"  (1.753 m), weight 126.2 kg (278 lb 3.5 oz), last menstrual period 09/27/2011, SpO2 95 %. No results found.  Recent Labs  10/06/15 0332  WBC 6.8  HGB 11.1*  HCT 32.3*  PLT 272    Recent Labs  10/05/15 1010  NA 141  K 3.5  CL 109  GLUCOSE 118*  BUN 6  CREATININE 0.73  CALCIUM 8.5*   CBG (last 3)  No results for input(s): GLUCAP in the last 72 hours.  Wt Readings from Last 3 Encounters:  10/07/15 126.2 kg (278 lb 3.5 oz)  09/28/15 113.399 kg (250 lb)  07/26/15 117.935 kg (260 lb)    Physical Exam:  Constitutional: She is oriented to person, place, and time. She appears well-developed and well-nourished.  HENT: #18 site s/p extraction. No bleeding or abnormal color noted, mild tenderness Head: Normocephalic. Crani incision covered.  Right Ear: External ear normal.  Left Ear: External ear normal.  Eyes: Conjunctivae and EOM are normal.  Neck: Normal range of motion. Neck supple. No thyromegaly present.  Cardiovascular: Normal rate and regular rhythm. No murmurs, rubs, or gallops Respiratory: Effort normal and breath sounds normal. No respiratory distress. NO WHEEZES--GOOD AIR MOVEMENT GI: Soft. Bowel sounds are normal. She exhibits no distension.  Musculoskeletal:  PROM No tenderness in extremities  Neurological: She is alert and oriented to person, place, and time. Seems to have good insight and awareness, ?slightly disinhibited Follows full commands Sensation 1/2 LUE and LLE, perhaps more diminished distal left leg Motor: R UE/RLE: 5/5 proximal distal L UE: 3- to 3 delt, 3/5 bicep,tricep, wrist, HI.   LLE: HF 2, KE 2+, ADF/APF 3.  DTRs increased on left side versus right-sided  Skin: Skin is warm and dry.  Psychiatric: Her behavior is normal, pleasant, a little disinhibited. Judgment normal.   Assessment/Plan: 1. 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 Physiatrist is providing close team supervision and 24 hour management of active medical problems listed below. Physiatrist and rehab team continue to assess barriers to discharge/monitor patient progress toward functional and medical goals.  Function:  Bathing Bathing position      Bathing parts      Bathing assist        Upper Body Dressing/Undressing Upper body dressing                    Upper body assist        Lower Body Dressing/Undressing Lower body dressing                                  Lower body assist        Toileting Toileting   Toileting steps completed by patient: Adjust clothing prior to toileting, Performs perineal hygiene, Adjust clothing after toileting   Toileting Assistive Devices: Grab bar or rail  Toileting assist Assist level: Supervision or verbal cues   Transfers Chair/bed transfer             Locomotion Ambulation  Wheelchair          Cognition Comprehension Comprehension assist level: Follows complex conversation/direction with no assist  Expression Expression assist level: Expresses complex ideas: With no assist  Social Interaction Social Interaction assist level: Interacts appropriately with others with medication or extra time (anti-anxiety, antidepressant).  Problem Solving Problem solving assist level: Solves complex problems: Recognizes & self-corrects  Memory Memory assist level: Complete Independence: No helper   Medical Problem List and Plan: 1. Left-sided weakness and headache secondary to brain abscess likely  secondary to dental caries status post tooth extraction 2. DVT Prophylaxis/Anticoagulation: SCDs. Ambulate/oob 3. Pain Management: Percocet as needed. Monitor with increased activity in therapy 4. Mood: Xanax 0.25 mg 3 times a day as needed. Appears positive at this time. 5. Neuropsych: This patient is capable of making decisions on her own behalf. 6. Skin/Wound Care: Routine skin checks. Remove incision "cover" from head 7. Fluids/Electrolytes/Nutrition: Routine I&O with follow-up chemistries ordered. -encourage adequate PO intake 8. ID. Gram stain with gram-negative rods/GPC in pairs.Flagyl 500 mg QID. Ceftriaxone 6 weeks  -afebrile 9. Tooth extraction today, #18. Minimal pain. Advance diet as tolerated. Oral cavity and extraction continues to be clean/healthy 10. Constipation. Laxative assistance 11. Asthma: albuterol/atrovent HHN q4 prn---suspect anxiety component also  -IS/OOB.   -continue to monitor  LOS (Days) 1 A FACE TO FACE EVALUATION WAS PERFORMED  SWARTZ,ZACHARY T 10/08/2015 9:25 AM

## 2015-10-08 NOTE — Progress Notes (Signed)
Physical Therapy Session Note  Patient Details  Name: [name] MRN: [id number] Date of Birth: Jan 30, 1966  Today's Date: 10/08/2015 PT Individual Time: 1445-1530 PT Individual Time Calculation (min): 45 min   Short Term Goals: Week 1:     Skilled Therapeutic Interventions/Progress Updates:  Pt transferred sit to stand with S. Pt ambulated about 150 feet with min A and multiple stand rest breaks. In gym pt performed multiple sit to stand transfers with S. Treatment focused on NMR, 3 sets x 10 reps cone taps and 3 sets x 10 reps alternating cone taps with st cane. Pt ambulated 10 feet on uneven surface with min A and verbal cues. Pt ambulated to room with st cane and min guard to S with verbal cues. Pt left sitting up in w/c with call bell within reach.    Therapy Documentation Precautions:  Precautions Precautions: Fall Restrictions Weight Bearing Restrictions: No General:   Pain: No c/o pain.   See Function Navigator for Current Functional Status.   Therapy/Group: Individual Therapy  Dub Amis 10/08/2015, 3:47 PM

## 2015-10-08 NOTE — Evaluation (Signed)
Physical Therapy Assessment and Plan  Patient Details  Name: [name] MRN: [id number] Date of Birth: Apr 29, 1966  PT Diagnosis: Difficulty walking and Muscle weakness Rehab Potential: Fair ELOS: 7 to 10 days   Today's Date: 10/08/2015 PT Individual Time: 1000-1100 PT Individual Time Calculation (min): 60 min    Problem List:  Patient Active Problem List   Diagnosis Date Noted  . Left hemiparesis ([place]) 10/07/2015  . Abscessed tooth 10/07/2015  . Asthma   . Acute blood loss anemia   . 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 Brain lesion 09/29/2015  . TIA (transient ischemic attack) 09/28/2015  . Headache 09/28/2015  . Adjustment disorder with mixed anxiety and depressed mood 07/02/2013  . Depressive disorder 07/03/2012    Past Medical History:  Past Medical History  Diagnosis Date  . Asthma   . Rotator cuff tear   . Perimenopausal   . Slipped intervertebral disc   . Meniscus tear   . Chronic knee pain     right  . Rheumatoid arthritis [facility])    Past Surgical History:  Past Surgical History  Procedure Laterality Date  . Cesarean section    . Oophorectomy    . Tubal ligation    . Shoulder surgery    . Craniotomy Right 10/03/2015    Procedure: Stereotactic right frontoparietal craniotomy for excisional biopsy of lesion ;  Surgeon: Consuella Lose, MD;  Location: [place] NEURO ORS;  Service: Neurosurgery;  Laterality: Right;  . Application of cranial navigation Right 10/03/2015    Procedure: APPLICATION OF CRANIAL NAVIGATION;  Surgeon: Consuella Lose, MD;  Location: [place] NEURO ORS;  Service: Neurosurgery;  Laterality: Right;    Assessment & Plan Clinical Impression: Patient is a 50 y.o. right handed female with history of asthma as well as recent tooth infection. Patient lives alone with 2 young children and independent prior to admission. She plans to stay with her sister on discharge who works day shift as a Radio producer, but notes she has another sister who will stay with her  during the day. One level home with one-step entry. Presented 10/02/2015 with sudden onset of headache and left-sided paresthesias and mild weakness. CT and MRI of the brain demonstrated a ring-enhancing right postcentral lesions suspicious for abscess. Underwent stereotactic right frontoparietal craniotomy for resection of lesion 10/03/2015 per Dr. Kathyrn Sheriff. Decadron protocol and taper. Gram stain with gram-negative rods but also GPC in pairs. Infectious disease consulted and presently on Rocephin 6 weeks and Flagyl 500 mg 4 times a day. He underwent tooth extraction per dental surgery 10/07/2015 for recent tooth infection. Tolerating a regular diet. Physical and occupational therapy evaluation completed 10/04/2015 with recommendations of physical medicine rehabilitation consult.   Patient transferred to CIR on 10/07/2015 .   Patient currently requires min with mobility secondary to muscle weakness and decreased cardiorespiratoy endurance.  Prior to hospitalization, patient was independent  with mobility and lived with  (2 sons) in a House home.  Home access is 1Stairs to enter.  Patient will benefit from skilled PT intervention to maximize safe functional mobility, minimize fall risk and decrease caregiver burden for planned discharge home with 24 hour supervision.  Anticipate patient will benefit from follow up [place] at discharge.  PT - End of Session Activity Tolerance: Tolerates 30+ min activity with multiple rests Endurance Deficit: Yes PT Assessment Rehab Potential (ACUTE/IP ONLY): Fair Barriers to Discharge: Inaccessible home environment PT Patient demonstrates impairments in the following area(s): Balance;Endurance;Motor PT Transfers Functional Problem(s): Bed Mobility;Bed to Chair;Car PT Locomotion Functional  Problem(s): Ambulation;Stairs PT Plan PT Intensity: Minimum of 1-2 x/day ,45 to 90 minutes PT Frequency: 5 out of 7 days PT Duration Estimated Length of Stay: 7 to 10 days PT  Treatment/Interventions: Ambulation/gait training;Balance/vestibular training;Disease management/prevention;Community reintegration;DME/adaptive equipment instruction;Functional electrical stimulation;Functional mobility training;Patient/family education;Neuromuscular re-education;Stair training;Therapeutic Activities;Therapeutic Exercise;Visual/perceptual remediation/compensation;UE/LE Coordination activities;UE/LE Strength taining/ROM;Wheelchair propulsion/positioning PT Transfers Anticipated Outcome(s): mod I transfers PT Locomotion Anticipated Outcome(s): mod I gait, mod I stairs PT Recommendation Follow Up Recommendations: Home health PT Patient destination: Home Equipment Recommended: To be determined  Skilled Therapeutic Intervention PT evaluation completed and treatment plan initiated. Pt preformed multiple sit to stand transfers with S. Pt performed multiple stand pivot transfers with min guard. Pt ambulated 5 feet x 2 with min A and verbal cues.  Pt returned to room following treatment and left sitting up in w/c.   PT Evaluation Precautions/Restrictions Precautions Precautions: Fall Restrictions Weight Bearing Restrictions: No General Chart Reviewed: Yes Family/Caregiver Present: No  Pain No c/o pain.  Home Living/Prior Functioning Home Living Available Help at Discharge: Available 24 hours/day;Family;Friend(s) Type of Home: House Home Access: Stairs to enter [employer] of Steps: 1 Home Layout: One level Bathroom Shower/Tub: Multimedia programmer: Standard Bathroom Accessibility: Yes  Lives With:  (2 sons) Prior Function Level of Independence: Independent with transfers;Independent with gait  Able to Take Stairs?: Yes Driving: Yes Vocation: Full time employment Vision/Perception  As per OT evaluation.  Cognition Overall Cognitive Status: Impaired/Different from baseline Arousal/Alertness: Awake/alert Orientation Level: Oriented X4 Attention:  Sustained;Selective Sustained Attention: Appears intact Selective Attention: Appears intact Memory: Appears intact Awareness: Appears intact Problem Solving: Appears intact Safety/Judgment: Appears intact Sensation Sensation Light Touch: Impaired Detail Light Touch Impaired Details: Impaired LLE Stereognosis: Appears Intact Hot/Cold: Appears Intact Proprioception: Impaired Detail Proprioception Impaired Details: Impaired LLE Coordination Gross Motor Movements are Fluid and Coordinated: No Motor  Motor Motor:  (hemiparesis) Mobility Bed Mobility Bed Mobility: Rolling Right;Rolling Left;Sit to Supine;Supine to Sit Rolling Right: 5: Supervision Rolling Left: 5: Supervision Supine to Sit: 4: Min guard Sit to Supine: 4: Min guard Transfers Transfers: Yes Sit to Stand: 4: Min guard Stand to Sit: 4: Min guard Stand Pivot Transfers: 4: Min assist Locomotion  Ambulation Ambulation: Yes Ambulation/Gait Assistance: 4: Min assist Ambulation Distance (Feet): 150 Feet Assistive device: 1 person hand held assist Stairs / Additional Locomotion Stairs: Yes Stairs Assistance: 4: Min assist Stair Management Technique: One rail Right Number of Stairs: 4 Height of Stairs: 6 Wheelchair Mobility Wheelchair Mobility: Yes Wheelchair Assistance: 5: Careers information officer: Both lower extermities Distance: 100  Trunk/Postural Assessment  Cervical Assessment Cervical Assessment: Within Water engineer Thoracic Assessment: Within Functional Limits Lumbar Assessment Lumbar Assessment: Within Functional Limits Postural Control Postural Control: Deficits on evaluation  Balance Balance Balance Assessed: Yes Static Sitting Balance Static Sitting - Balance Support: Feet unsupported Static Sitting - Level of Assistance: 5: Stand by assistance Dynamic Sitting Balance Dynamic Sitting - Balance Support: Right upper extremity supported;No upper extremity  supported Dynamic Sitting - Level of Assistance: 5: Stand by assistance Static Standing Balance Static Standing - Balance Support: During functional activity Static Standing - Level of Assistance: 5: Stand by assistance Dynamic Standing Balance Dynamic Standing - Balance Support: During functional activity Dynamic Standing - Level of Assistance: 4: Min assist Extremity Assessment  B UEs as per OT evaluation. RLE Assessment RLE Assessment: Within Functional Limits LLE Assessment LLE Assessment: Exceptions to WFL LLE PROM (degrees) Overall PROM Left Lower Extremity: Within functional limits for tasks assessed  LLE Strength LLE Overall Strength: Deficits LLE Overall Strength Comments: grossly 3-/5 to 3/5   See Function Navigator for Current Functional Status.   Refer to Care Plan for Long Term Goals  Recommendations for other services: None  Discharge Criteria: Patient will be discharged from PT if patient refuses treatment 3 consecutive times without medical reason, if treatment goals not met, if there is a change in medical status, if patient makes no progress towards goals or if patient is discharged from hospital.  The above assessment, treatment plan, treatment alternatives and goals were discussed and mutually agreed upon: by patient  Dub Amis 10/08/2015, 4:36 PM

## 2015-10-09 MED ORDER — SODIUM CHLORIDE 0.9 % IJ SOLN
10.0000 mL | INTRAMUSCULAR | Status: DC | PRN
  Administered 2015-10-13: 10 mL
  Filled 2015-10-09: qty 40

## 2015-10-09 MED ORDER — TRAZODONE HCL 50 MG PO TABS
50.0000 mg | ORAL_TABLET | Freq: Every evening | ORAL | Status: DC | PRN
  Administered 2015-10-09 – 2015-10-13 (×6): 50 mg via ORAL
  Filled 2015-10-09 (×6): qty 1

## 2015-10-09 MED ORDER — BISACODYL 10 MG RE SUPP
10.0000 mg | Freq: Every day | RECTAL | Status: DC | PRN
  Administered 2015-10-09: 10 mg via RECTAL
  Filled 2015-10-09 (×2): qty 1

## 2015-10-09 MED ORDER — SODIUM CHLORIDE 0.9 % IJ SOLN
10.0000 mL | Freq: Two times a day (BID) | INTRAMUSCULAR | Status: DC
  Administered 2015-10-13 – 2015-10-14 (×2): 10 mL

## 2015-10-09 NOTE — Progress Notes (Signed)
Peripherally Inserted Central Catheter/Midline Placement  The IV Nurse has discussed with the patient and/or persons authorized to consent for the patient, the purpose of this procedure and the potential benefits and risks involved with this procedure.  The benefits include less needle sticks, lab draws from the catheter and patient may be discharged home with the catheter.  Risks include, but not limited to, infection, bleeding, blood clot (thrombus formation), and puncture of an artery; nerve damage and irregular heat beat.  Alternatives to this procedure were also discussed.  PICC/Midline Placement Documentation  PICC Single Lumen [id number] PICC Right Basilic 42 cm 0 cm (Active)  Indication for Insertion or Continuance of Line Home intravenous therapies (PICC only) 10/09/2015 12:00 PM  Exposed Catheter (cm) 0 cm 10/09/2015 12:00 PM  Line Status Flushed;Saline locked;Blood return noted 10/09/2015 12:00 PM  Dressing Type Transparent 10/09/2015 12:00 PM  Dressing Change Due 10/16/15 10/09/2015 12:00 PM       [employer] Payment 10/09/2015, 12:02 PM

## 2015-10-09 NOTE — Progress Notes (Signed)
[place] PHYSICAL MEDICINE & REHABILITATION     PROGRESS NOTE    Subjective/Complaints: Had an excellent night sleep last night. Breathing is doing fairly well. No wheezing. Pain under control.   ROS: Pt denies fever, rash/itching, headache, blurred or double vision, nausea, vomiting, abdominal pain, diarrhea, chest pain, shortness of breath, palpitations, dysuria, dizziness, neck or back pain, bleeding,  or depression   Objective: Vital Signs: Blood pressure 121/56, pulse 60, temperature 98.1 F (36.7 C), temperature source Oral, resp. rate 18, height 5\' 9"  (1.753 m), weight 126.2 kg (278 lb 3.5 oz), last menstrual period 09/27/2011, SpO2 97 %. No results found. No results for input(s): WBC, HGB, HCT, PLT in the last 72 hours. No results for input(s): NA, K, CL, GLUCOSE, BUN, CREATININE, CALCIUM in the last 72 hours.  Invalid input(s): CO CBG (last 3)  No results for input(s): GLUCAP in the last 72 hours.  Wt Readings from Last 3 Encounters:  10/07/15 126.2 kg (278 lb 3.5 oz)  09/28/15 113.399 kg (250 lb)  07/26/15 117.935 kg (260 lb)    Physical Exam:  Constitutional: She is oriented to person, place, and time. She appears well-developed and well-nourished.  HENT: #18 site s/p extraction. Area appears to be healing nicely Head: Normocephalic. Crani incision covered.  Right Ear: External ear normal.  Left Ear: External ear normal.  Eyes: Conjunctivae and EOM are normal.  Neck: Normal range of motion. Neck supple. No thyromegaly present.  Cardiovascular: Normal rate and regular rhythm. No murmurs, rubs, or gallops Respiratory: Effort normal and breath sounds normal. No respiratory distress. NO WHEEZES--GOOD AIR MOVEMENT GI: Soft. Bowel sounds are normal. She exhibits no distension.  Musculoskeletal:  PROM No tenderness in extremities  Neurological: She is alert and oriented to person, place, and time. Seems to have good insight and awareness, ?slightly  disinhibited Follows full commands Sensation 1/2 LUE and LLE, perhaps more diminished distal left leg Motor: R UE/RLE: 5/5 proximal distal L UE: 3- to 3 delt, 3/5 bicep,tricep, wrist, HI.  LLE: HF 2, KE 2+, ADF/APF 3.  DTRs increased on left side versus right-sided  Skin: Skin is warm and dry.  Psychiatric: Her behavior is normal, pleasant, a little disinhibited. Judgment normal.   Assessment/Plan: 1. 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 Physiatrist is providing close team supervision and 24 hour management of active medical problems listed below. Physiatrist and rehab team continue to assess barriers to discharge/monitor patient progress toward functional and medical goals.  Function:  Bathing Bathing position   Position: Wheelchair/chair at sink  Bathing parts Body parts bathed by patient: Right arm, Left arm, Chest, Abdomen, Front perineal area, Buttocks, Right upper leg, Left upper leg, Right lower leg, Left lower leg    Bathing assist Assist Level: Touching or steadying assistance(Pt > 75%)      Upper Body Dressing/Undressing Upper body dressing   What is the patient wearing?: Bra, Pull over shirt/dress Bra - Perfomed by patient: Thread/unthread right bra strap, Thread/unthread left bra strap Bra - Perfomed by helper: Hook/unhook bra (pull down sports bra) Pull over shirt/dress - Perfomed by patient: Thread/unthread right sleeve, Thread/unthread left sleeve, Put head through opening, Pull shirt over trunk          Upper body assist        Lower Body Dressing/Undressing Lower body dressing   What is the patient wearing?: Underwear, Pants, Non-skid slipper socks Underwear -  Performed by patient: Thread/unthread right underwear leg, Thread/unthread left underwear leg, Pull underwear up/down   Pants- Performed by patient: Thread/unthread right pants leg,  Thread/unthread left pants leg, Pull pants up/down, Fasten/unfasten pants   Non-skid slipper socks- Performed by patient: Don/doff right sock, Don/doff left sock                    Lower body assist Assist for lower body dressing: Touching or steadying assistance (Pt > 75%)      Toileting Toileting   Toileting steps completed by patient: Adjust clothing prior to toileting, Performs perineal hygiene, Adjust clothing after toileting   Toileting Assistive Devices: Grab bar or rail  Toileting assist Assist level: Touching or steadying assistance (Pt.75%)   Transfers Chair/bed transfer   Chair/bed transfer method: Stand pivot, Ambulatory Chair/bed transfer assist level: Touching or steadying assistance (Pt > 75%) Chair/bed transfer assistive device: Armrests     Locomotion Ambulation     Max distance: 150 Assist level: Touching or steadying assistance (Pt > 75%)   Wheelchair     Max wheelchair distance: 100 Assist Level: Supervision or verbal cues  Cognition Comprehension Comprehension assist level: Follows complex conversation/direction with no assist  Expression Expression assist level: Expresses complex ideas: With no assist  Social Interaction Social Interaction assist level: Interacts appropriately with others - No medications needed.  Problem Solving Problem solving assist level: Solves complex problems: Recognizes & self-corrects  Memory Memory assist level: Complete Independence: No helper   Medical Problem List and Plan: 1. Left-sided weakness and headache secondary to brain abscess likely secondary to dental caries status post tooth extraction 2. DVT Prophylaxis/Anticoagulation: SCDs. Ambulate/oob 3. Pain Management: Percocet as needed. Monitor with increased activity in therapy 4. Mood: Xanax 0.25 mg 3 times a day as needed. Appears positive at this time. 5. Neuropsych: This patient is capable of making decisions on her own behalf. 6. Skin/Wound Care:  Routine skin checks. Remove staples this week 7. Fluids/Electrolytes/Nutrition: Routine I&O with follow-up chemistries pending -encouraging adequate PO intake 8. ID. Gram stain with gram-negative rods/GPC in pairs.Flagyl 500 mg QID. Ceftriaxone 6 weeks  -afebrile 9. Tooth extraction   #18. Minimal pain. Tolerating regular diet. Area healing nicely 10. Constipation. Laxative assistance 11. Asthma: improved today  - albuterol/atrovent HHN q4 prn---suspect anxiety component also  -IS/OOB.   -continue to monitor  LOS (Days) 2 A FACE TO FACE EVALUATION WAS PERFORMED  Lyzette Reinhardt T 10/09/2015 8:51 AM

## 2015-10-10 ENCOUNTER — Encounter (HOSPITAL_COMMUNITY): Payer: Self-pay | Admitting: Dentistry

## 2015-10-10 ENCOUNTER — Inpatient Hospital Stay (HOSPITAL_COMMUNITY): Payer: Medicaid Other

## 2015-10-10 ENCOUNTER — Inpatient Hospital Stay (HOSPITAL_COMMUNITY): Payer: Self-pay | Admitting: Occupational Therapy

## 2015-10-10 LAB — COMPREHENSIVE METABOLIC PANEL
ALBUMIN: 2.8 g/dL — AB (ref 3.5–5.0)
ALK PHOS: 59 U/L (ref 38–126)
ALT: 12 U/L — AB (ref 14–54)
AST: 20 U/L (ref 15–41)
Anion gap: 7 (ref 5–15)
BUN: 9 mg/dL (ref 6–20)
CALCIUM: 8.1 mg/dL — AB (ref 8.9–10.3)
CHLORIDE: 105 mmol/L (ref 101–111)
CO2: 28 mmol/L (ref 22–32)
CREATININE: 0.72 mg/dL (ref 0.44–1.00)
GFR calc non Af Amer: >60 mL/min (ref >60)
GLUCOSE: 219 mg/dL — AB (ref 65–99)
Potassium: 3.4 mmol/L — ABNORMAL LOW (ref 3.5–5.1)
SODIUM: 140 mmol/L (ref 135–145)
Total Bilirubin: 0.2 mg/dL — ABNORMAL LOW (ref 0.3–1.2)
Total Protein: 5.7 g/dL — ABNORMAL LOW (ref 6.5–8.1)

## 2015-10-10 LAB — CBC WITH DIFFERENTIAL/PLATELET
BASOS ABS: 0 10*3/uL (ref 0.0–0.1)
BASOS PCT: 0 %
EOS ABS: 0 10*3/uL (ref 0.0–0.7)
EOS PCT: 0 %
HCT: 30.6 % — ABNORMAL LOW (ref 36.0–46.0)
HEMOGLOBIN: 10.6 g/dL — AB (ref 12.0–15.0)
Lymphocytes Relative: 34 %
Lymphs Abs: 1.2 10*3/uL (ref 0.7–4.0)
MCH: 32.9 pg (ref 26.0–34.0)
MCHC: 34.6 g/dL (ref 30.0–36.0)
MCV: 95 fL (ref 78.0–100.0)
Monocytes Absolute: 0.4 10*3/uL (ref 0.1–1.0)
Monocytes Relative: 12 %
NEUTROS PCT: 54 %
Neutro Abs: 1.9 10*3/uL (ref 1.7–7.7)
PLATELETS: 291 10*3/uL (ref 150–400)
RBC: 3.22 MIL/uL — AB (ref 3.87–5.11)
RDW: 12.6 % (ref 11.5–15.5)
WBC: 3.5 10*3/uL — AB (ref 4.0–10.5)

## 2015-10-10 MED ORDER — POTASSIUM CHLORIDE CRYS ER 20 MEQ PO TBCR
20.0000 meq | EXTENDED_RELEASE_TABLET | Freq: Every day | ORAL | Status: DC
  Administered 2015-10-10 – 2015-10-15 (×6): 20 meq via ORAL
  Filled 2015-10-10 (×6): qty 1

## 2015-10-10 MED ORDER — DEXAMETHASONE 4 MG PO TABS
4.0000 mg | ORAL_TABLET | Freq: Three times a day (TID) | ORAL | Status: DC
  Administered 2015-10-10 – 2015-10-12 (×6): 4 mg via ORAL
  Filled 2015-10-10 (×6): qty 1

## 2015-10-10 NOTE — Progress Notes (Signed)
Physical Therapy Session Note  Patient Details  Name: Brandace Dattilo MRN: [id number] Date of Birth: 1966/07/13  Today's Date: 10/10/2015 PT Individual Time: 1130-1205 PT Individual Time Calculation (min): 35 min   Short Term Goals: Week 1:  PT Short Term Goal 1 (Week 1): Pt will increase bed mobility to S.  PT Short Term Goal 2 (Week 1): Pt will increase transfers to S.  PT Short Term Goal 3 (Week 1): Pt will increaseambulation to S about 150 feet.  PT Short Term Goal 4 (Week 1): Pt will ascend/descend 1 stairs with no rails and S.  PT Short Term Goal 5 (Week 1): Pt will ascend/descend fligth of stairs with S.   Skilled Therapeutic Interventions/Progress Updates: pt concerned about diarrhea and reluctant to leave room.  Pt willing to do therapy bedside. Pt appeared anxious.  PT instructed pt in diaphragmatic breathing; needed VCs for counting out loud during exertion.  neuromuscular re-education via VCS, manual cues for 15 x 1 L/ R straight leg raises, alternating R/L ankle pumps; L hip abduction in sidelying, L clam shells, bil bridging, L unilateral bridging.  Pt left resting in bed with all needs within reach.     Therapy Documentation Precautions:  Precautions Precautions: Fall Restrictions Weight Bearing Restrictions: No   Pain: Pain Assessment Pain Assessment: No/denies pain      See Function Navigator for Current Functional Status.   Therapy/Group: Individual Therapy  Oniya Mandarino 10/10/2015, 12:13 PM

## 2015-10-10 NOTE — Progress Notes (Signed)
Occupational Therapy Session Note  Patient Details  Name: [name] MRN: [id number] Date of Birth: [birth year]  Today's Date: 10/10/2015 OT Individual Time: 0830-1000 OT Individual Time Calculation (min): 90 min    Short Term Goals: Week 1:  OT Short Term Goal 1 (Week 1): STGs equal to LTGs set at modified independent to independent  Skilled Therapeutic Interventions/Progress Updates:    Pt seen for OT ADL bathing and dressing session. Pt sitting up in bed upon arrival, agreeable to tx session. She ambulated throughout room with supervision adn gathered clothing items, bending over to pick up clothing with supervision. She completed bathing task seated in w/c at sink. Pt unable to dress due to PICC line and awaiting IV team to thread line. She completed grooming task standing at sink with supervision.  Pt completed standing clothes pin tree with emphasis on L UE fine mottor control and coordination. Pt voiced having difficulties with depth perception during ambulation and functional tasks, however, did not demonstrate this during clothes pin task. Pt able to manipulate all levels of clothes pins to place on vertical pole. Clothes pin tree then placed on floor and pt required to bend over to remove clothes pins, able to complete with supervision.  She then complete small PVC pipe tree task with emphasis on using B UEs simulatensouly.  Completed in standing. Pt desiring to cont to work on [employer] skills outside therapy time. PVC task left in pt's room for her to work on outside of tx time. She had lots of questions regarding prognosis and deficits. She is very motivated in therapy and desiring to get home soon to two young children.    Therapy Documentation Precautions:  Precautions Precautions: Fall Restrictions Weight Bearing Restrictions: No Pain: Pain Assessment Pain Assessment: 0-10 Pain Score: 2  Pain Type: Acute pain Pain Location: Head Pain Descriptors / Indicators: Headache Pain  Onset: Gradual Patients Stated Pain Goal: 2 Pain Intervention(s): Ambulation/ increased activity; repositioned  See Function Navigator for Current Functional Status.   Therapy/Group: Individual Therapy  Lewis, Merrianne Mccumbers C 10/10/2015, 7:17 AM

## 2015-10-10 NOTE — Progress Notes (Signed)
[name]     PROGRESS NOTE    Subjective/Complaints: Didn't sleep as well last night. [name] was a bit noisy. Still pleased with progress. Got to see kids yesterday.   ROS: Pt denies fever, rash/itching, headache, blurred or double vision, nausea, vomiting, abdominal pain, diarrhea, chest pain, shortness of breath, palpitations, dysuria, dizziness, neck or back pain, bleeding,  or depression   Objective: Vital Signs: Blood pressure 139/66, pulse 72, temperature 98.1 F (36.7 C), temperature source Oral, resp. rate 18, height 5\' 9"  (1.753 m), weight 126.2 kg (278 lb 3.5 oz), last menstrual period 09/27/2011, SpO2 95 %. No results found.  Recent Labs  10/10/15 0537  WBC 3.5*  HGB 10.6*  HCT 30.6*  PLT 291    Recent Labs  10/10/15 0537  NA 140  K 3.4*  CL 105  GLUCOSE 219*  BUN 9  CREATININE 0.72  CALCIUM 8.1*   CBG (last 3)  No results for input(s): GLUCAP in the last 72 hours.  Wt Readings from Last 3 Encounters:  10/07/15 126.2 kg (278 lb 3.5 oz)  09/28/15 113.399 kg (250 lb)  07/26/15 117.935 kg (260 lb)    Physical Exam:  Constitutional: She is oriented to person, place, and time. She appears well-developed and well-nourished.  HENT: #18 site s/p extraction. Area appears to be healing nicely Head: Normocephalic. Crani incision intact Right Ear: External ear normal.  Left Ear: External ear normal.  Eyes: Conjunctivae and EOM are normal.  Neck: Normal range of motion. Neck supple. No thyromegaly present.  Cardiovascular: Normal rate and regular rhythm. No murmurs, rubs, or gallops Respiratory: Effort normal and breath sounds normal. No respiratory distress. STILL NO WHEEZES--GOOD AIR MOVEMENT GI: Soft. Bowel sounds are normal. She exhibits no distension.  Musculoskeletal:  PROM No tenderness in extremities  Neurological: She is alert and oriented to person, place, and time. Seems to have good insight and awareness,  ?slightly disinhibited Follows full commands Sensation 1/2 LUE and LLE, perhaps more diminished distal left leg Motor: R UE/RLE: 5/5 proximal distal L UE:  3 to 3+ delt, 3+/5 bicep,tricep, wrist, HI.  LLE: HF 2+ KE 2-3+, ADF/APF 3.  DTRs increased on left side versus right-sided  Skin: Skin is warm and dry. Crani site with staples Psychiatric: Her behavior is normal, pleasant, a little disinhibited. Judgment normal.   Assessment/Plan: 1. 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 Physiatrist is providing close team supervision and 24 hour management of active medical problems listed below. Physiatrist and rehab team continue to assess barriers to discharge/monitor patient progress toward functional and medical goals.  Function:  Bathing Bathing position   Position: Wheelchair/chair at sink  Bathing parts Body parts bathed by patient: Right arm, Chest, Abdomen, Left arm, Front perineal area, Left lower leg, Right lower leg, Left upper leg, Right upper leg, Buttocks    Bathing assist Assist Level: Touching or steadying assistance(Pt > 75%)      Upper Body Dressing/Undressing Upper body dressing   What is the patient wearing?: Hospital gown Bra - Perfomed by patient: Thread/unthread right bra strap, Thread/unthread left bra strap Bra - Perfomed by helper: Hook/unhook bra (pull down sports bra) Pull over shirt/dress - Perfomed by patient: Thread/unthread right sleeve, Thread/unthread left sleeve, Put head through opening, Pull shirt over trunk          Upper body assist  Lower Body Dressing/Undressing Lower body dressing   What is the patient wearing?: Underwear, Pants, Non-skid slipper socks Underwear - Performed by patient: Thread/unthread right underwear leg, Thread/unthread left underwear leg, Pull underwear up/down   Pants- Performed by patient:  Thread/unthread right pants leg, Thread/unthread left pants leg, Pull pants up/down, Fasten/unfasten pants   Non-skid slipper socks- Performed by patient: Don/doff right sock, Don/doff left sock                    Lower body assist Assist for lower body dressing: Touching or steadying assistance (Pt > 75%)      Toileting Toileting   Toileting steps completed by patient: Adjust clothing prior to toileting, Performs perineal hygiene, Adjust clothing after toileting   Toileting Assistive Devices: Grab bar or rail  Toileting assist Assist level: Touching or steadying assistance (Pt.75%)   Transfers Chair/bed transfer   Chair/bed transfer method: Ambulatory Chair/bed transfer assist level: Supervision or verbal cues Chair/bed transfer assistive device: Armrests     Locomotion Ambulation     Max distance: 150 Assist level: Touching or steadying assistance (Pt > 75%)   Wheelchair     Max wheelchair distance: 100 Assist Level: Supervision or verbal cues  Cognition Comprehension Comprehension assist level: Follows complex conversation/direction with no assist  Expression Expression assist level: Expresses complex ideas: With no assist  Social Interaction Social Interaction assist level: Interacts appropriately with others - No medications needed.  Problem Solving Problem solving assist level: Solves complex problems: Recognizes & self-corrects  Memory Memory assist level: Complete Independence: No helper   Medical Problem List and Plan: 1. 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 She is motivated 2. DVT Prophylaxis/Anticoagulation: SCDs and ambulation 3. Pain Management: Percocet as needed. Monitor with increased activity in therapy 4. Mood: Xanax 0.25 mg 3 times a day as needed. Appears positive at this time. 5. Neuropsych: This patient is capable of making  decisions on her own behalf. 6. Skin/Wound Care: Routine skin checks. Remove staples later this week. "over dressing" removed 7. Fluids/Electrolytes/Nutrition: I personally reviewed the patient's labs today. Replace potassium -encouraging adequate PO intake 8. ID. Gram stain with gram-negative rods/GPC in pairs.Flagyl 500 mg QID. Ceftriaxone 6 weeks  -afebrile  -cbc personally reviewed, wbc 3.5, hgb 10.6 9. Tooth extraction   #18. Minimal pain. Tolerating regular diet. Area healing nicely 10. Constipation. Laxative assistance 11. Asthma: improved   - albuterol/atrovent HHN q4 prn---suspect anxiety component also  -IS/OOB.   -continue to monitor  LOS (Days) 3 A FACE TO FACE EVALUATION WAS PERFORMED  [name] T 10/10/2015 9:03 AM

## 2015-10-10 NOTE — Progress Notes (Signed)
Patient information reviewed and entered into eRehab system by Kevonta Phariss, RN, CRRN, PPS Coordinator.  Information including medical coding and functional independence measure will be reviewed and updated through discharge.    

## 2015-10-10 NOTE — IPOC Note (Signed)
Overall Plan of Care [facility]) Patient Details Name: Kelila Devaul MRN: [id number] DOB: 1966/07/27  Admitting Diagnosis: brain abscess  Hospital Problems: Principal Problem:   Brain abscess Active Problems:   Adjustment disorder with mixed anxiety and depressed mood   Left hemiparesis ([place])   Abscessed tooth     Functional Problem List: Nursing Medication Management, Motor, Pain, Skin Integrity  PT Balance, Endurance, Motor  OT Balance, Cognition, Motor, Safety, Perception  SLP    TR         Basic ADL's: OT Grooming, Bathing, Dressing, Toileting     Advanced  ADL's: OT Simple Meal Preparation, Laundry, Light Housekeeping     Transfers: PT Bed Mobility, Bed to Chair, Teacher, early years/pre, Tub/Shower     Locomotion: PT Ambulation, Stairs     Additional Impairments: OT Fuctional Use of Upper Extremity  SLP        TR      Anticipated Outcomes Item Anticipated Outcome  Self Feeding independent  Swallowing      Basic self-care  modified independent  Toileting  independent   Bathroom Transfers independent  Bowel/Bladder  mod I   Transfers  mod I transfers  Locomotion  mod I gait, mod I stairs  Communication     Cognition     Pain  less than 3   Safety/Judgment  mod I    Therapy Plan: PT Intensity: Minimum of 1-2 x/day ,45 to 90 minutes PT Frequency: 5 out of 7 days PT Duration Estimated Length of Stay: 7 to 10 days OT Intensity: Minimum of 1-2 x/day, 45 to 90 minutes OT Frequency: 5 out of 7 days OT Duration/Estimated Length of Stay: 7 days         Team Interventions: Nursing Interventions Patient/Family Education, Disease Management/Prevention, Pain Management, Medication Management, Skin Care/Wound Management, Discharge Planning  PT interventions Ambulation/gait training, Balance/vestibular training, Disease management/prevention, [employer] reintegration, Engineer, drilling, Functional electrical stimulation, Functional mobility  training, Patient/family education, Neuromuscular re-education, Stair training, Therapeutic Activities, Therapeutic Exercise, Visual/perceptual remediation/compensation, UE/LE Coordination activities, UE/LE Strength taining/ROM, Wheelchair propulsion/positioning  OT Interventions Training and development officer, Academic librarian, Discharge planning, Functional mobility training, DME/adaptive equipment instruction, Neuromuscular re-education, Self Care/advanced ADL retraining, Therapeutic Exercise, UE/LE Strength taining/ROM, UE/LE Coordination activities, Patient/family education, Therapeutic Activities  SLP Interventions    TR Interventions    SW/CM Interventions Discharge Planning, Psychosocial Support, Patient/Family Education    Team Discharge Planning: Destination: PT-Home ,OT- Home , SLP-  Projected Follow-up: PT-Home health PT, OT-  Outpatient OT, SLP-  Projected Equipment Needs: PT-To be determined, OT- To be determined, SLP-  Equipment Details: PT- , OT-  Patient/family involved in discharge planning: PT- Patient,  OT-Patient, SLP-   MD ELOS: 7-9 days Medical Rehab Prognosis:  Excellent Assessment: The patient has been admitted for CIR therapies with the diagnosis of brain abscess s/p craniotomy. The team will be addressing functional mobility, strength, stamina, balance, safety, adaptive techniques and equipment, self-care, bowel and bladder mgt, patient and caregiver education, wound care, ego support, NMR, activity tolerance, pain control, community reintegration. Goals have been set at mod I for mobility and self-care/ADL tasks.    Meredith Staggers, MD, FAAPMR      See Team Conference Notes for weekly updates to the plan of care

## 2015-10-10 NOTE — Progress Notes (Signed)
Physical Therapy Session Note  Patient Details  Name: [name] MRN: [id number] Date of Birth: [birth year]  Today's Date: 10/10/2015 PT Individual Time: 1330-1430 PT Individual Time Calculation (min): 60 min   Short Term Goals: Week 1:  PT Short Term Goal 1 (Week 1): Pt will increase bed mobility to S.  PT Short Term Goal 2 (Week 1): Pt will increase transfers to S.  PT Short Term Goal 3 (Week 1): Pt will increaseambulation to S about 150 feet.  PT Short Term Goal 4 (Week 1): Pt will ascend/descend 1 stairs with no rails and S.  PT Short Term Goal 5 (Week 1): Pt will ascend/descend fligth of stairs with S.   Skilled Therapeutic Interventions/Progress Updates:    Pt reports having loose stool today and declines leaving room due to frequent need to have BM. Assisted pt x 2 with gait in/out of bathroom with close supervision to steady assist (over uneven surface) and managing IV pole but pt able to perform hygiene independently. Administered Berg Balance test (see results below) to assess fall risk and educated on purpose as well as various balance activities that we can do to address impairments identified. Neuro re-ed for balance re-training on compliant surface while performing weighshifting activities for forced weightbearing on LLE, then performed heel raises on compliant and non compliant surface to address ankle strategies for balance, and maintaining balance with narrow BOS. Education during rest breaks on goals and d/c planning. End of session left on toilet with instructions to pull call bell when finished.   Therapy Documentation Precautions:  Precautions Precautions: Fall Restrictions Weight Bearing Restrictions: No   Pain: Pain Assessment Pain Assessment: No/denies pain    Balance: Balance Balance Assessed: Yes Standardized Balance Assessment Standardized Balance Assessment: Berg Balance Test Berg Balance Test Sit to Stand: Able to stand without using hands and stabilize  independently Standing Unsupported: Able to stand safely 2 minutes Sitting with Back Unsupported but Feet Supported on Floor or Stool: Able to sit safely and securely 2 minutes Stand to Sit: Controls descent by using hands Transfers: Able to transfer safely, minor use of hands Standing Unsupported with Eyes Closed: Able to stand 10 seconds with supervision Standing Ubsupported with Feet Together: Able to place feet together independently and stand 1 minute safely From Standing, Reach Forward with Outstretched Arm: Can reach confidently >25 cm (10") From Standing Position, Pick up Object from Floor: Able to pick up shoe, needs supervision From Standing Position, Turn to Look Behind Over each Shoulder: Looks behind from both sides and weight shifts well Turn 360 Degrees: Able to turn 360 degrees safely but slowly Standing Unsupported, Alternately Place Feet on Step/Stool: Able to complete >2 steps/needs minimal assist Standing Unsupported, One Foot in Front: Loses balance while stepping or standing Standing on One Leg: Able to lift leg independently and hold 5-10 seconds Total Score: 43   See Function Navigator for Current Functional Status.   Therapy/Group: Individual Therapy  Canary Brim Ivory Broad, PT, DPT  10/10/2015, 3:31 PM

## 2015-10-11 ENCOUNTER — Inpatient Hospital Stay (HOSPITAL_COMMUNITY): Payer: Medicaid Other

## 2015-10-11 ENCOUNTER — Inpatient Hospital Stay (HOSPITAL_COMMUNITY): Payer: Medicaid Other | Admitting: Occupational Therapy

## 2015-10-11 ENCOUNTER — Inpatient Hospital Stay (HOSPITAL_COMMUNITY): Payer: Self-pay | Admitting: Occupational Therapy

## 2015-10-11 MED ORDER — ALPRAZOLAM 0.5 MG PO TABS
0.5000 mg | ORAL_TABLET | Freq: Every day | ORAL | Status: DC
  Administered 2015-10-12 – 2015-10-14 (×2): 0.5 mg via ORAL
  Filled 2015-10-11 (×4): qty 1

## 2015-10-11 NOTE — Progress Notes (Signed)
Physical Therapy Session Note  Patient Details  Name: [name] MRN: [id number] Date of Birth: 01-30-1966  Today's Date: 10/11/2015 PT Individual Time: 0925-0940 PT Individual Time Calculation (min): 15 min   Short Term Goals: Week 1:  PT Short Term Goal 1 (Week 1): Pt will increase bed mobility to S.  PT Short Term Goal 2 (Week 1): Pt will increase transfers to S.  PT Short Term Goal 3 (Week 1): Pt will increaseambulation to S about 150 feet.  PT Short Term Goal 4 (Week 1): Pt will ascend/descend 1 stairs with no rails and S.  PT Short Term Goal 5 (Week 1): Pt will ascend/descend fligth of stairs with S.   Skilled Therapeutic Interventions/Progress Updates:   OT had let this therapist know that pt requesting to rest and would like to attempt session at 930. When checked on pt, she declined therapy due to headache and not sleeping at all last night due to anxiety. Discussion and education in regards to goals, LOS, and energy conservation to prepare for home. Pt missed 45 min and will re-attempt to see pt if schedule allows.   Therapy Documentation Precautions:  Precautions Precautions: Fall Restrictions Weight Bearing Restrictions: No General: PT Amount of Missed Time (min): 45 Minutes PT Missed Treatment Reason: Patient fatigue  Pain: Reports headache - premedicated.   See Function Navigator for Current Functional Status.   Therapy/Group: Individual Therapy  Canary Brim Ivory Broad, PT, DPT  10/11/2015, 9:57 AM

## 2015-10-11 NOTE — Care Management Note (Signed)
[facility] Individual Statement of Services  Patient Name:  [name]  Date:  10/11/2015  Welcome to the [facility].  Our goal is to provide you with an individualized program based on your diagnosis and situation, designed to meet your specific needs.  With this comprehensive rehabilitation program, you will be expected to participate in at least 3 hours of rehabilitation therapies Monday-Friday, with modified therapy programming on the weekends.  Your rehabilitation program will include the following services:  Physical Therapy (PT), Occupational Therapy (OT), 24 hour per day rehabilitation nursing, Therapeutic Recreaction (TR), Neuropsychology, Case Management (Social Worker), Rehabilitation Medicine, Nutrition Services and Pharmacy Services  Weekly team conferences will be held on Tuesdays to discuss your progress.  Your Social Worker will talk with you frequently to get your input and to update you on team discussions.  Team conferences with you and your family in attendance may also be held.  Expected length of stay: 7-10 days  Overall anticipated outcome: modified independent  Depending on your progress and recovery, your program may change. Your Social Worker will coordinate services and will keep you informed of any changes. Your Social Worker's name and contact numbers are listed  below.  The following services may also be recommended but are not provided by the [facility] will be made to provide these services after discharge if needed.  Arrangements include referral to agencies that provide these services.  Your insurance has been verified to be:  Medicaid Your primary doctor is:  Dr. Harrington Challenger (may change primary care while here)  Pertinent information will be shared with your  doctor and your insurance company.  Social Worker:  [place], [place] or ([phone]   Information discussed with and copy given to patient by: Lennart Pall, 10/11/2015, 9:57 AM

## 2015-10-11 NOTE — Progress Notes (Signed)
Social Work  Social Work Assessment and Plan  Patient Details  Name: [name] MRN: [id number] Date of Birth: [birth year]  Today's Date: 10/10/2015  Problem List:  Patient Active Problem List   Diagnosis Date Noted  . Left hemiparesis ([place]) 10/07/2015  . Abscessed tooth 10/07/2015  . Asthma   . Acute blood loss anemia   . 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 Brain lesion 09/29/2015  . TIA (transient ischemic attack) 09/28/2015  . Headache 09/28/2015  . Adjustment disorder with mixed anxiety and depressed mood 07/02/2013  . Depressive disorder 07/03/2012   Past Medical History:  Past Medical History  Diagnosis Date  . Asthma   . Rotator cuff tear   . Perimenopausal   . Slipped intervertebral disc   . Meniscus tear   . Chronic knee pain     right  . Rheumatoid arthritis [facility])    Past Surgical History:  Past Surgical History  Procedure Laterality Date  . Cesarean section    . Oophorectomy    . Tubal ligation    . Shoulder surgery    . Craniotomy Right 10/03/2015    Procedure: Stereotactic right frontoparietal craniotomy for excisional biopsy of lesion ;  Surgeon: Consuella Lose, MD;  Location: [place] [facility];  Service: Neurosurgery;  Laterality: Right;  . Application of cranial navigation Right 10/03/2015    Procedure: APPLICATION OF CRANIAL NAVIGATION;  Surgeon: Consuella Lose, MD;  Location: [place] [facility];  Service: Neurosurgery;  Laterality: Right;  . Tooth extraction N/A 10/07/2015    Procedure: Extraction of tooth #18 with alveoloplasty;  Surgeon: Lenn Cal, DDS;  Location: [facility];  Service: Oral Surgery;  Laterality: N/A;   Social History:  reports that she has never smoked. She has never used smokeless tobacco. She reports that she does not drink alcohol or use illicit drugs.  Family / Support Systems Marital Status: Divorced How Long?: 5 yrs Patient Roles: Parent, Other (Comment) (self-employed Therapist, art) Children: pt has twin 48 yr old sons, Promise and  Precise - currently staying with her sister locally Other Supports: Pt has 7 sisters with 4 of them living in  [place].  Sister, Elayah Enwright @ ([phone]; Altagracia Felgar @ (C) [phone];  Grayci Kelemen - Harris @ ([phone] Anticipated Caregiver: Inge Rise and Vicky Ability/Limitations of Caregiver: [name] works during the day and Olegario Shearer will be staying with the patient during those times. Caregiver Availability: 24/7 Family Dynamics: Pt describes very close relationship with her sisters and states, "we are always there for one another."  Social History Preferred language: English Religion: Non-Denominational Cultural Background: NA Education: Master's degree in Public Ed. 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   Abuse/Neglect Physical Abuse: Denies Verbal Abuse: Denies Sexual Abuse: Denies Exploitation of patient/patient's resources: Denies Self-Neglect: Denies  Emotional Status Pt's affect, behavior adn adjustment status: Pt very pleasant, talkative and extremely open about her personal life and her family.  She holds a very deep faith and strong family bonds and speaks often of her "love village" of people who have assisted her over the years and vice versa.  Has a very eloquent way of speaking and able  to describe in detail the fear and anxiety she has experienced throughout this entire ordeal.  Never becomes tearful but very retrospective about how the trauma of the onset of her illness may have affected her two sons who were alone with her.  One son having to get EMS and contact family.  Her other son has autism and she  feels that he is "doing okay" with the entire experience.  She is monitoring their adjustment reactions as well as her own and feels confident in her ability to provide support or seek support help is needed. Recent Psychosocial Issues: None Pyschiatric History: None, however, pt does go to therapy at [facility] with her two sons to cope with living with autism in the family.  She also has sons counseled to address their adjustment to parents' divorce and the fact that their father has had no contact with sons in 47 yrs. Substance Abuse History: None  Patient / Family Perceptions, Expectations & Goals Pt/Family understanding of illness & functional limitations: Pt has a very good understanding of the course of the infection as it affected her brain.  Good understanding of the surgery performed and able to verbalize very clearly about her current functional limitations. Premorbid pt/family roles/activities: Pt was a "very involved mother" and active with her sons.  Close with sisters and frequently spending time with them all. Anticipated changes in roles/activities/participation: Pt may need some minimal assistance overall with care of sons, home management, transportation. Pt/family expectations/goals: "I just need to be whole again."  [employer]: Other (Comment) Hospital doctor, Child Support) Premorbid Home Care/DME Agencies: None Transportation available at discharge: yes, sisters and friends  Discharge Planning Living Arrangements: Children Support Systems: Other relatives, Friends/neighbors Type of Residence: Private residence Insurance Resources: Medicaid (specify county) ([place] Museum/gallery curator Resources: Employment, Other (Comment) (child support) Museum/gallery curator Screen Referred: No Living Expenses: Education officer, community Management: Patient Does the patient have any problems obtaining your medications?: No Home Management: pt Patient/Family Preliminary Plans: Pt intends to  initially d/c home with sister, [name] (with sons) where 24/7 care is available. Social Work Anticipated Follow Up Needs: HH/OP Expected length of stay: 7-10 days  Clinical Impression Very pleasant, talkative woman here following surgery to address a brain abscess and now with resulting weakness on left side.  Speaks openly about her concerns for her sons (9yo twins) and their emotional adjustment to this acute medical crisis.  She describes an extremely supportive family and friend pool.  Denies any significant emotional distress herself, but exhibits a very introspective style and open to addressing concerns as they arise.  Anticipate a short LOS and mod i goals.  Will follow for support and d/c planning needs.  Luverna Degenhart 10/10/2015, 10:19 AM

## 2015-10-11 NOTE — Progress Notes (Signed)
Occupational Therapy Session Note  Patient Details  Name: [name] MRN: [id number] Date of Birth: [birth year]  Today's Date: 10/11/2015 OT Individual Time: 1030-1130 OT Individual Time Calculation (min): 60 min    Short Term Goals: Week 1:  OT Short Term Goal 1 (Week 1): STGs equal to LTGs set at modified independent to independent  Skilled Therapeutic Interventions/Progress Updates:    Pt seen for OT session focusing on education and functional mobility. Pt in supine upon arrival, voicing increased fatigue and overall not feeling well. Pt initially declining therapy, however, willing to participate in education sitting EOB. Handouts provided regarding energy conservation. Discussed with pt modifying routine in order to conserve energy and importance of planning ahead. Discussed with pt DME and home layout including use of shower chair and 3-1 BSC.  Pt willing to take short walk throughout unit. She ambulated at supervision level while carrying items to return to gym, demonstrating good functional standing balance and endurance. In ADL apartment, she completed sit <> stand from  Low soft surface couch with supervision. She returned to room at end of session, left sitting in recliner with all needs in reach. Pt's friend/ caregiver came during middle of session. Discussed d/c planning, mod I goals, role of OT, continuum of care, making every day tasks therapeutic, and energy conservation.  Discussed with pt option for community outing tomorrow. 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 Will follow up with scheduling team and Rec. Therapist for possible outing tomorrow.   Therapy Documentation Precautions:  Precautions Precautions: Fall Restrictions Weight Bearing Restrictions: No Pain: Pain Assessment Pain Assessment: 0-10 Pain Score: 7  Pain Type: Acute pain Pain Location: Head Pain Descriptors / Indicators:  Aching Pain Frequency: Intermittent Pain Onset: Gradual Patients Stated Pain Goal: 4 Pain Intervention(s): Repositioned, ambulation/ increased activity  See Function Navigator for Current Functional Status.   Therapy/Group: Individual Therapy  Lewis, Dominic Rhome C 10/11/2015, 7:08 AM

## 2015-10-11 NOTE — Progress Notes (Signed)
Occupational Therapy Note  Patient Details  Name: Madilee Gronau MRN: [id number] Date of Birth: 1965-12-25  Today's Date: 10/11/2015 OT Individual Time: 1350-1410 OT Individual Time Calculation (min): 20 min Today's Date: 10/11/2015 OT Missed Time: 25 Minutes Missed Time Reason: Pain;Patient fatigue  Pt c/o 10/10 headache which she stated was "extremely exhausting"; RN administered meds during session Individual Therapy  Attempted to engage patient in [place] tasks.  Pt states that she has had a "terrible" headache all day and she is exhausted.  Engaged in [place] at bed level.  Pt missed 25 mins secondary to pain and fatigue.    Leotis Shames [facility] 10/11/2015, 2:44 PM

## 2015-10-11 NOTE — Progress Notes (Signed)
Occupational Therapy Session Note  Patient Details  Name: [name] MRN: [id number] Date of Birth: 01-26-1966  Today's Date: 10/11/2015 OT Individual Time: 4656-8127 OT Individual Time Calculation (min): 25 min (missed last 5 min due to fatigue)   Short Term Goals: Week 1:  OT Short Term Goal 1 (Week 1): STGs equal to LTGs set at modified independent to independent  Skilled Therapeutic Interventions/Progress Updates:    Pt seen for skilled OT to facilitate LUE [facility] and [facility] to enable to be more Independent with her ADLs.  Pt received sitting in w/c completing oral care at sink. Pt stated she was extremely tired for poor sleep. She did not have enough energy to shower or sponge bathe or dress. Pt ambulated to bed with S. In bed pt worked on [employer] activities of [facility] with fastening small buttons, isolated finger coordination, intrinsic hand strengthening and L shoulder strength and control with placing and holding followed by controlled eccentric lowering and PNF D1 patterns. Pt stated she really needed to rest and would like to delay her next PT session. Spoke with her PT to allow pt to rest first half of her session. Pt in bed resting with all needs met.   Therapy Documentation Precautions:  Precautions Precautions: Fall Restrictions Weight Bearing Restrictions: No  General OT Amount of Missed Time: 5 Minutes    Pain: Pain Assessment Pain Assessment: 0-10 Pain Score: 3  Pain Type: Acute pain Pain Location: Head Pain Descriptors / Indicators: Aching Pain Onset: Gradual  - RN provided pain meds ADL:  See Function Navigator for Current Functional Status.   Therapy/Group: Individual Therapy  Rosibel Giacobbe 10/11/2015, 9:50 AM

## 2015-10-11 NOTE — Progress Notes (Signed)
[place] PHYSICAL MEDICINE & REHABILITATION     PROGRESS NOTE    Subjective/Complaints: Some anxiety last night which prevented her from sleeping. Didn't take xanax until early this morning. Otherwise feels that she's improving despite being "tired" in the AM.   ROS: Pt denies fever, rash/itching, headache, blurred or double vision, nausea, vomiting, abdominal pain, diarrhea, chest pain, shortness of breath, palpitations, dysuria, dizziness, neck or back pain, bleeding,  or depression   Objective: Vital Signs: Blood pressure 122/61, pulse 65, temperature 97.8 F (36.6 C), temperature source Oral, resp. rate 18, height 5\' 9"  (1.753 m), weight 126.2 kg (278 lb 3.5 oz), last menstrual period 09/27/2011, SpO2 95 %. No results found.  Recent Labs  10/10/15 0537  WBC 3.5*  HGB 10.6*  HCT 30.6*  PLT 291    Recent Labs  10/10/15 0537  NA 140  K 3.4*  CL 105  GLUCOSE 219*  BUN 9  CREATININE 0.72  CALCIUM 8.1*   CBG (last 3)  No results for input(s): GLUCAP in the last 72 hours.  Wt Readings from Last 3 Encounters:  10/07/15 126.2 kg (278 lb 3.5 oz)  09/28/15 113.399 kg (250 lb)  07/26/15 117.935 kg (260 lb)    Physical Exam:  Constitutional: She is oriented to person, place, and time. She appears well-developed and well-nourished.  HENT: #18 site s/p extraction. Area appears to be healing nicely Head: Normocephalic. Crani incision intact Right Ear: External ear normal.  Left Ear: External ear normal.  Eyes: Conjunctivae and EOM are normal.  Neck: Normal range of motion. Neck supple. No thyromegaly present.  Cardiovascular: Normal rate and regular rhythm. No murmurs, rubs, or gallops Respiratory: Effort normal and breath sounds normal. No respiratory distress. STILL NO WHEEZES--GOOD AIR MOVEMENT GI: Soft. Bowel sounds are normal. She exhibits no distension.  Musculoskeletal:  PROM No tenderness in extremities  Neurological: She is alert and oriented to  person, place, and time. Seems to have good insight and awareness, ?slightly disinhibited Follows full commands Sensation 1+/2 LUE and LLE, perhaps more diminished distal left leg Motor: R UE/RLE: 5/5 proximal distal L UE:  4 delt, 4 to 4+/5 bicep,tricep, wrist, HI.  LLE: HF 4- KE 4, ADF/APF 4.  DTRs increased on left side versus right-sided  Skin: Skin is warm and dry. Crani site with staples, dry Psychiatric: Her behavior is normal, pleasant, a little anxious. Judgment normal.   Assessment/Plan: 1. 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 Physiatrist is providing close team supervision and 24 hour management of active medical problems listed below. Physiatrist and rehab team continue to assess barriers to discharge/monitor patient progress toward functional and medical goals.  Function:  Bathing Bathing position   Position: Wheelchair/chair at sink  Bathing parts Body parts bathed by patient: Right arm, Chest, Abdomen, Left arm, Left lower leg, Right lower leg, Left upper leg, Right upper leg    Bathing assist Assist Level: Supervision or verbal cues      Upper Body Dressing/Undressing Upper body dressing   What is the patient wearing?: Hospital gown Bra - Perfomed by patient: Thread/unthread right bra strap, Thread/unthread left bra strap Bra - Perfomed by helper: Hook/unhook bra (pull down sports bra) Pull over shirt/dress - Perfomed by patient: Thread/unthread right sleeve, Thread/unthread left sleeve, Put head through opening, Pull shirt over trunk          Upper body assist  Lower Body Dressing/Undressing Lower body dressing   What is the patient wearing?: Underwear, Pants, Non-skid slipper socks Underwear - Performed by patient: Thread/unthread right underwear leg, Thread/unthread left underwear leg, Pull underwear up/down   Pants- Performed by  patient: Thread/unthread right pants leg, Thread/unthread left pants leg, Pull pants up/down, Fasten/unfasten pants   Non-skid slipper socks- Performed by patient: Don/doff right sock, Don/doff left sock                    Lower body assist Assist for lower body dressing: Touching or steadying assistance (Pt > 75%)      Toileting Toileting   Toileting steps completed by patient: Adjust clothing prior to toileting, Performs perineal hygiene, Adjust clothing after toileting   Toileting Assistive Devices: Grab bar or rail  Toileting assist Assist level: Supervision or verbal cues   Transfers Chair/bed transfer   Chair/bed transfer method: Ambulatory Chair/bed transfer assist level: Supervision or verbal cues Chair/bed transfer assistive device: Armrests     Locomotion Ambulation     Max distance: 15' Assist level: Touching or steadying assistance (Pt > 75%)   Wheelchair     Max wheelchair distance: 100 Assist Level: Supervision or verbal cues  Cognition Comprehension Comprehension assist level: Follows complex conversation/direction with no assist  Expression Expression assist level: Expresses complex ideas: With no assist  Social Interaction Social Interaction assist level: Interacts appropriately with others - No medications needed.  Problem Solving Problem solving assist level: Solves complex problems: Recognizes & self-corrects  Memory Memory assist level: Complete Independence: No helper   Medical Problem List and Plan: 1. 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 She is progressing 2. DVT Prophylaxis/Anticoagulation: SCDs and ambulation 3. Pain Management: Percocet as needed. Monitor with increased activity in therapy 4. Mood: Xanax 0.25 mg 3 times a day as needed. Encouraged her to use at night if needed to help settle her down for sleep. 5. Neuropsych:  This patient is capable of making decisions on her own behalf. 6. Skin/Wound Care: Routine skin checks. Remove staples later this week. Op date 1/16 7. Fluids/Electrolytes/Nutrition: potassium repletion -encouraging adequate PO intake 8. ID. Gram stain with gram-negative rods/GPC in pairs.Flagyl 500 mg QID. Ceftriaxone 6 weeks--confirm start/end dates  -afebrile  -cbc personally reviewed, wbc 3.5, hgb 10.6 9. Tooth extraction   #18. Minimal pain. Tolerating regular diet. Area healing nicely 10. Constipation. Laxative assistance 11. Asthma: lungs completely clear   - albuterol/atrovent HHN q4 prn--anxiety component also  -IS/OOB.   -continue to monitor  LOS (Days) 4 A FACE TO FACE EVALUATION WAS PERFORMED  Thaine Garriga T 10/11/2015 9:07 AM

## 2015-10-12 ENCOUNTER — Inpatient Hospital Stay (HOSPITAL_COMMUNITY): Payer: Self-pay | Admitting: Occupational Therapy

## 2015-10-12 ENCOUNTER — Inpatient Hospital Stay (HOSPITAL_COMMUNITY): Payer: Medicaid Other | Admitting: Physical Therapy

## 2015-10-12 ENCOUNTER — Inpatient Hospital Stay (HOSPITAL_COMMUNITY): Payer: Medicaid Other | Admitting: *Deleted

## 2015-10-12 MED ORDER — DEXAMETHASONE 2 MG PO TABS
2.0000 mg | ORAL_TABLET | Freq: Two times a day (BID) | ORAL | Status: DC
  Administered 2015-10-14 – 2015-10-15 (×2): 2 mg via ORAL
  Filled 2015-10-12 (×3): qty 1

## 2015-10-12 MED ORDER — DEXAMETHASONE 4 MG PO TABS
4.0000 mg | ORAL_TABLET | Freq: Two times a day (BID) | ORAL | Status: DC
  Administered 2015-10-12 – 2015-10-15 (×7): 4 mg via ORAL
  Filled 2015-10-12 (×7): qty 1

## 2015-10-12 NOTE — Progress Notes (Signed)
[place] PHYSICAL MEDICINE & REHABILITATION     PROGRESS NOTE    Subjective/Complaints: Reports that she had headache last night. But overall got to sleep and did much better. Feels refreshed this am with only mild h/a symptoms. Excited to go on outing today  ROS: Pt denies fever, rash/itching, headache, blurred or double vision, nausea, vomiting, abdominal pain, diarrhea, chest pain, shortness of breath, palpitations, dysuria, dizziness, neck or back pain, bleeding,  or depression   Objective: Vital Signs: Blood pressure 146/65, pulse 62, temperature 98.3 F (36.8 C), temperature source Oral, resp. rate 18, height 5\' 9"  (1.753 m), weight 126.2 kg (278 lb 3.5 oz), last menstrual period 09/27/2011, SpO2 97 %. No results found.  Recent Labs  10/10/15 0537  WBC 3.5*  HGB 10.6*  HCT 30.6*  PLT 291    Recent Labs  10/10/15 0537  NA 140  K 3.4*  CL 105  GLUCOSE 219*  BUN 9  CREATININE 0.72  CALCIUM 8.1*   CBG (last 3)  No results for input(s): GLUCAP in the last 72 hours.  Wt Readings from Last 3 Encounters:  10/07/15 126.2 kg (278 lb 3.5 oz)  09/28/15 113.399 kg (250 lb)  07/26/15 117.935 kg (260 lb)    Physical Exam:  Constitutional: She is oriented to person, place, and time. She appears well-developed and well-nourished.  HENT: #18 site s/p extraction. Area appears to be healing nicely Head: Normocephalic. Crani incision intact Right Ear: External ear normal.  Left Ear: External ear normal.  Eyes: Conjunctivae and EOM are normal.  Neck: Normal range of motion. Neck supple. No thyromegaly present.  Cardiovascular: Normal rate and regular rhythm. No murmurs, rubs, or gallops Respiratory: Effort normal and breath sounds normal. No respiratory distress. STILL NO WHEEZES--GOOD AIR MOVEMENT GI: Soft. Bowel sounds are normal. She exhibits no distension.  Musculoskeletal:  PROM No tenderness in extremities  Neurological: She is alert and oriented to person,  place, and time. Seems to have good insight and awareness,   Follows full commands Sensation 1+/2 LUE and LLE, perhaps more diminished distal left leg Motor: R UE/RLE: 5/5 proximal distal L UE:  4 delt, 4 to 4+/5 bicep,tricep, wrist, HI.  LLE: HF 4- KE 4, ADF/APF 4.  DTRs increased on left side versus right-sided  Skin: Skin is warm and dry. Crani site with staples, dry Psychiatric: Her behavior is normal, pleasant, seems relaxed and rested today.   Assessment/Plan: 1. 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 Physiatrist is providing close team supervision and 24 hour management of active medical problems listed below. Physiatrist and rehab team continue to assess barriers to discharge/monitor patient progress toward functional and medical goals.  Function:  Bathing Bathing position   Position: Wheelchair/chair at sink  Bathing parts Body parts bathed by patient: Right arm, Chest, Abdomen, Left arm, Left lower leg, Right lower leg, Left upper leg, Right upper leg    Bathing assist Assist Level: Supervision or verbal cues      Upper Body Dressing/Undressing Upper body dressing   What is the patient wearing?: Hospital gown Bra - Perfomed by patient: Thread/unthread right bra strap, Thread/unthread left bra strap Bra - Perfomed by helper: Hook/unhook bra (pull down sports bra) Pull over shirt/dress - Perfomed by patient: Thread/unthread right sleeve, Thread/unthread left sleeve, Put head through opening, Pull shirt over trunk          Upper body assist  Lower Body Dressing/Undressing Lower body dressing   What is the patient wearing?: Underwear, Pants, Non-skid slipper socks Underwear - Performed by patient: Thread/unthread right underwear leg, Thread/unthread left underwear leg, Pull underwear up/down   Pants- Performed by patient: Thread/unthread right pants  leg, Thread/unthread left pants leg, Pull pants up/down, Fasten/unfasten pants   Non-skid slipper socks- Performed by patient: Don/doff right sock, Don/doff left sock                    Lower body assist Assist for lower body dressing: Touching or steadying assistance (Pt > 75%)      Toileting Toileting   Toileting steps completed by patient: Adjust clothing prior to toileting, Performs perineal hygiene, Adjust clothing after toileting   Toileting Assistive Devices: Grab bar or rail  Toileting assist Assist level: Supervision or verbal cues   Transfers Chair/bed transfer   Chair/bed transfer method: Ambulatory Chair/bed transfer assist level: Supervision or verbal cues Chair/bed transfer assistive device: Armrests     Locomotion Ambulation     Max distance: 15' Assist level: Touching or steadying assistance (Pt > 75%)   Wheelchair     Max wheelchair distance: 100 Assist Level: Supervision or verbal cues  Cognition Comprehension Comprehension assist level: Follows complex conversation/direction with no assist  Expression Expression assist level: Expresses complex ideas: With no assist  Social Interaction Social Interaction assist level: Interacts appropriately with others - No medications needed.  Problem Solving Problem solving assist level: Solves complex problems: Recognizes & self-corrects  Memory Memory assist level: Complete Independence: No helper   Medical Problem List and Plan: 1. 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  -outing today. Should do well with good night sleep 2. DVT Prophylaxis/Anticoagulation: SCDs and ambulation 3. Pain Management: Percocet as needed. Monitor with increased activity in therapy 4. Mood: Xanax 0.25 mg 3 times a day as needed.   -added a scheduled dose to help with sleep. 5. Neuropsych: This patient is capable of  making decisions on her own behalf. 6. Skin/Wound Care: Routine skin checks. Remove staples tomorrow 7. Fluids/Electrolytes/Nutrition: potassium repletion -encouraging adequate PO intake 8. ID. Gram stain with gram-negative rods/GPC in pairs.Flagyl 500 mg QID. Ceftriaxone 6 weeks--confirm start/end dates with ID---i have reviewed the acute chart, and I'm unclear at what starting point they're looking at  -afebrile  -cbc personally reviewed, wbc 3.5, hgb 10.6 9. Tooth extraction   #18. Minimal pain. Tolerating regular diet. Area healing nicely 10. Constipation. Laxative assistance 11. Asthma: lungs completely clear   - albuterol/atrovent HHN q4 prn--anxiety component also  -IS/OOB.   -continue to monitor  LOS (Days) 5 A FACE TO FACE EVALUATION WAS PERFORMED  SWARTZ,ZACHARY T 10/12/2015 9:06 AM

## 2015-10-12 NOTE — Patient Care Conference (Signed)
Inpatient RehabilitationTeam Conference and Plan of Care Update Date: 10/11/2015   Time: 2:10 PM    Patient Name: [name]      Medical Record Number: [id number]  Date of Birth: 1965-12-30 Sex: Female         Room/Bed: 4W11C/4W11C-01 Payor Info: Payor: MEDICAID [place] / Plan: MEDICAID [place] ACCESS / Product Type: *No Product type* /    Admitting Diagnosis: brain abscess  Admit Date/Time:  10/07/2015  2:45 PM Admission Comments: No comment available   Primary Diagnosis:  Brain abscess Principal Problem: Brain abscess  Patient Active Problem List   Diagnosis Date Noted  . Left hemiparesis ([place]) 10/07/2015  . Abscessed tooth 10/07/2015  . Asthma   . Acute blood loss anemia   . 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 Brain lesion 09/29/2015  . TIA (transient ischemic attack) 09/28/2015  . Headache 09/28/2015  . Adjustment disorder with mixed anxiety and depressed mood 07/02/2013  . Depressive disorder 07/03/2012    Expected Discharge Date: Expected Discharge Date: 10/15/15  Team Members Present: Physician leading conference: Dr. Alger Costa Social Worker Present: Teresa Pall, LCSW Nurse Present: Teresa Roberts, RN OT Present: [name], OT SLP Present: [name], SLP PPS Coordinator present : Teresa Nakayama, RN, CRRN     Current Status/Progress Goal Weekly Team Focus  Medical   righ intracranial abscess. left hemiparesis. anxiety/behavioral issues  improve activity/participation so it's more concistent  moods, breathing, sleep, ID   Bowel/Bladder   continent of bowel and bladder LBM 10/10/15  remain continent of bowel and blader with min assist  educate about causes and symptoms of constipation   Swallow/Nutrition/ Hydration             ADL's   Supervision overall  Mod I overall  ADL/IADL re-training;  Neuro re-ed. FM coordination.    Mobility   supervision to steady assist overall  mod I overall  neuro re-ed, gait, stairs, endurance, balance, and d/c planning    Communication             Safety/Cognition/ Behavioral Observations            Pain   Reports headache and lower back pain of 6-8/10  pain less than or equal to 4 on a scale of 0-10  assess pain q4h and prn, medicate as indicated   Skin   Staples to scalp, no other skin issues noted  no new skin injury/breakdown  assess skin q shift and prn observing for any signs of breakdown    Rehab Goals Patient on target to meet rehab goals: Yes *See Care Plan and progress notes for long and short-term goals.  Barriers to Discharge: anxiety, mood.     Possible Resolutions to Barriers:  ego support, improve sleep    Discharge Planning/Teaching Needs:  Plan to d/c home with sister, [name], and family/ friends to provide any assistance needed      Team Discussion:  Some issues continue with poor sleep and anxiety.  Therapies report pt does not seem to fully participate at times per c/o fatigue and wanting to rest.  Outing planned for tomorrow.  Have set mod i goals.  Will need IV abx at home.  MD plans to remove staples prior to d/c.  Revisions to Treatment Plan:  None   Continued Need for Acute Rehabilitation Level of Care: The patient requires daily medical management by a physician with specialized training in physical medicine and rehabilitation for the following conditions: Daily direction of a multidisciplinary physical  rehabilitation program to ensure safe treatment while eliciting the highest outcome that is of practical value to the patient.: Yes Daily medical management of patient stability for increased activity during participation in an intensive rehabilitation regime.: Yes Daily analysis of laboratory values and/or radiology reports with any subsequent need for medication adjustment of medical intervention for : Mood/behavior problems;Wound care problems;Neurological problems  [name] 10/12/2015, 11:25 AM

## 2015-10-12 NOTE — Progress Notes (Signed)
Occupational Therapy Session Note  Patient Details  Name: [name] MRN: [id number] Date of Birth: 06/05/1966  Today's Date: 10/12/2015 OT Concurrent Time: 1110-1330 OT Concurrent Time Calculation (min): 140 min   Short Term Goals: Week 1:  OT Short Term Goal 1 (Week 1): STGs equal to LTGs set at modified independent to independent  Skilled Therapeutic Interventions/Progress Updates:    Pt seen for community outing to local restaurant. Focus of session on community mobility, functional activity tolerance, and education. Pt ambulated at overall supervision level without AD. She required steadying assist and VCs for navigating steps onto rehab van. She completed ambulation over uneven surfaces with supervision and accessed/ used public restroom at mod I level. Pt demonstrated good functional activity tolerance throughout session and able to verbalize and utilize energy conservation techniques without VCs .  Extensive education provided throughout session regarding energy conservation, importance of self advocacy, prioritizing tasks, reducing fall risks, community/ handicap accessibility, and d/c planning. Pt met all goals set for outing. See outing sheet in pt's shadow chart for details. Pt voiced really enjoying outing, stating she finally felt "like [name] again, not just a patient".  Pt returned back to room at end of outing, RN aware and left pt in room with RN present administering medications.    Therapy Documentation Precautions:  Precautions Precautions: Fall Restrictions Weight Bearing Restrictions: No Pain: Pain Assessment Pain Assessment: 0-10 Pain Score: 4  Pain Type: Acute pain Pain Location: Head Pain Orientation: Right Pain Descriptors / Indicators: Aching Pain Frequency: Intermittent Pain Onset: Gradual Patients Stated Pain Goal: 4 Pain Intervention(s): ambulation/ increased activity, relaxation, repositioned, distraction  See Function Navigator for Current  Functional Status.   Therapy/Group: Individual Therapy  Lewis, Amy C 10/12/2015, 1:54 PM  

## 2015-10-12 NOTE — Progress Notes (Signed)
Physical Therapy Session Note  Patient Details  Name: [name] MRN: [id number] Date of Birth: 23-Feb-1966  Today's Date: 10/12/2015 PT Individual Time: 1015-1100 PT Individual Time Calculation (min): 45 min   Short Term Goals: Week 1:  PT Short Term Goal 1 (Week 1): Pt will increase bed mobility to S.  PT Short Term Goal 2 (Week 1): Pt will increase transfers to S.  PT Short Term Goal 3 (Week 1): Pt will increaseambulation to S about 150 feet.  PT Short Term Goal 4 (Week 1): Pt will ascend/descend 1 stairs with no rails and S.  PT Short Term Goal 5 (Week 1): Pt will ascend/descend fligth of stairs with S.   Skilled Therapeutic Interventions/Progress Updates:   Patient sitting in wheelchair, reporting fatigue from bathing and dressing but feeling much better after getting a good night's sleep last night. Patient ambulated without AD throughout tiled and carpeted surfaces with supervision 2 x > 150 ft. Performed high level dynamic standing balance including backwards walking and marching with ambulation with supervision and [place] to R and L with min A overall, attempted standing heel raises but patient deferred task due to fear of getting a muscle cramp. Performed standing stretches for BLE heel cords, hip adductors, and hamstrings, 2 x 60 sec each LE. In stairwell, patient negotiated up/down 22 stairs using R rail with step-to pattern and supervision, standing rest breaks at top of each flight of stairs due to fatigue and shortness of breath. Patient performed furniture transfers to low compliant couch surface with supervision. Patient excited for upcoming outing and left with OT sitting in chairs by elevator.  Therapy Documentation Precautions:  Precautions Precautions: Fall Restrictions Weight Bearing Restrictions: No Pain: Pain Assessment Pain Assessment: No/denies pain Pain Score: 0-No pain   See Function Navigator for Current Functional Status.   Therapy/Group: Individual  Therapy  Laretta Alstrom 10/12/2015, 11:11 AM

## 2015-10-12 NOTE — Progress Notes (Signed)
[facility]  Patient Status: New pt for [facility] this admission  [facility] is providing the following services: HHRN and Home Infusion Pharmacy for home IV ABX upon DC.  [facility] hospital Infusion Coordinator will support in hospital teaching for independence at home with the IV ABX set up and administration. Appt made for initial teaching with pt for 11 Am on 10-13-15 as her schedule allows.   If patient discharges after hours, please call [phone].   Larry Sierras 10/12/2015, 5:16 PM

## 2015-10-12 NOTE — Progress Notes (Signed)
Social Work Patient ID: [name], female   DOB: July 06, 1966, 50 y.o.   MRN: [id number]   Pt aware and agreeable with targeted d/c date of 1/28 with mod i goals.  Have contacted [facility] liaison to begin coordinating supplies for home IV abx needs and training.  Pt going on outing today.    Louetta Hollingshead, LCSW

## 2015-10-12 NOTE — Progress Notes (Signed)
Occupational Therapy Session Note  Patient Details  Name: [name] MRN: [id number] Date of Birth: 06/28/1966  Today's Date: 10/12/2015 OT Individual Time: 1400-1430 OT Individual Time Calculation (min): 30 min    Short Term Goals: Week 1:  OT Short Term Goal 1 (Week 1): STGs equal to LTGs set at modified independent to independent  Skilled Therapeutic Interventions/Progress Updates:    Pt seen for OT tx session focusing on functional mobility and transfers. Pt in supine upon arrival, voicing fatigue from earlier community outing, however, agreeable to tx session. She ambulated throughout unit at supervision- mod I level. In ADL apartment, pt completed simulated shower stall transfers. Practiced transfers with shower set-up of her sister's house where she plans to d/c to and set-up of her home shower for when she returns to living independently. Pt completed all shower transfers at supervision- mod I level.  Educated extensively regarding reducing risk of falls, bathroom DME including hand held shower head and grab bars, practicing shower transfer prior to completing showering task, energy conservation in shower including sitting to complete bathing task vs. Standing and sitting to complete dynamic tasks (i.e. Shaving legs) in order to reduce risk of falls. Pt with questions regarding how to best interact and be engaged with her young children while still being safe. Discussed ways to incorporate therapy into play by using L UE during tasks, sitting to complete dynamic games (i.e. Sit to bounce ball back and forth vs. Standing), and discussing with children and caregiver her limitations prior to d/c home. Pt voiced understanding and appreciation for client centered care. Pt returned to room at end of session, returning to supine and left with all needs in reach.    Therapy Documentation Precautions:  Precautions Precautions: Fall Restrictions Weight Bearing Restrictions: No Pain: Pain  Assessment Pain Assessment: 0-10 Pain Score: 4  Pain Type: Acute pain Pain Location: Head Pain Orientation: Right Pain Descriptors / Indicators: Aching Pain Frequency: Intermittent Pain Onset: Gradual Patients Stated Pain Goal: 4 Pain Intervention(s): Pt voiced being pre-medicated prior to tx session.   See Function Navigator for Current Functional Status.   Therapy/Group: Individual Therapy  Lewis, Casia Corti C 10/12/2015, 3:05 PM

## 2015-10-13 ENCOUNTER — Inpatient Hospital Stay (HOSPITAL_COMMUNITY): Payer: Self-pay | Admitting: Occupational Therapy

## 2015-10-13 ENCOUNTER — Inpatient Hospital Stay (HOSPITAL_COMMUNITY): Payer: Medicaid Other

## 2015-10-13 NOTE — Progress Notes (Signed)
Physical Therapy Session Note  Patient Details  Name: [name] MRN: [id number] Date of Birth: 07-26-1966  Today's Date: 10/13/2015 PT Individual Time: 1100-1200 PT Individual Time Calculation (min): 60 min   Short Term Goals: Week 1:  PT Short Term Goal 1 (Week 1): Pt will increase bed mobility to S.  PT Short Term Goal 2 (Week 1): Pt will increase transfers to S.  PT Short Term Goal 3 (Week 1): Pt will increaseambulation to S about 150 feet.  PT Short Term Goal 4 (Week 1): Pt will ascend/descend 1 stairs with no rails and S.  PT Short Term Goal 5 (Week 1): Pt will ascend/descend fligth of stairs with S.   Skilled Therapeutic Interventions/Progress Updates:    Session focused on functional gait on unit with focus on awareness to L side and fluidity of gait pattern, stair negotiation training in stair well up/down 2 flights of stairs x 2 trials to simulate home access at supervision level and extra time, floor transfer training to simulate pt getting on floor to play with her kids at supervision level, and HEP given and reviewed with pt return demonstrating various quadruped activities to activate core, neuro re-ed for balance and WB through LLE and LUE. Handouts also given to patient. Discussed recommendation for trying yoga/pilates upon d/c, as pt does not qualify for follow up therapies, and pt very interested stating there is a yoga studio near her home as well. D/c planning discussed with patient throughout session and performed mobility at overall supervision level.   Therapy Documentation Precautions:  Precautions Precautions: Fall Restrictions Weight Bearing Restrictions: No  Pain: Denies pain.    See Function Navigator for Current Functional Status.   Therapy/Group: Individual Therapy  Canary Brim Ivory Broad, PT, DPT  10/13/2015, 12:29 PM

## 2015-10-13 NOTE — Progress Notes (Signed)
Occupational Therapy Session Note  Patient Details  Name: [name] MRN: [id number] Date of Birth: [birth year]  Today's Date: 10/13/2015 OT Individual Time: 1330-1400 OT Individual Time Calculation (min): 30 min    Short Term Goals: Week 1:  OT Short Term Goal 1 (Week 1): STGs equal to LTGs set at modified independent to independent  Skilled Therapeutic Interventions/Progress Updates:    Pt seen for OT session focusing on IADL re-training. Pt sitting EOB upon arrival with RN present, agreeable to tx session. She denied any pain this session. She ambulated throughout unit at supervision- mod I level.  Completed laundry task with pt responsible for placing/ removing items from front/ top loader machine. Pt demonstrated good functional dynamic standing balance, able to reach into/ bend over to obtain items at supervision- mod I level without LOB episode. Educated regarding laundry modifications for energy conservation including using smaller bottles of detergent so it's not so heavy, doing more smaller loads throughout the week as not to over fatigue, and sitting to fold laundry.  Pt then ambulated to kitchen and discussed kitchen mobility and energy conservation techniques in kitchen. Will practice hands on kitchen tasks tomorrow. Told pt to think about any concerns/ problem solving issues she may have in kitchen and we will address it tomorrow.  Educated throughout regarding planning ahead, energy conservation, designating responsibilities, and d/c planning.   Therapy Documentation Precautions:  Precautions Precautions: Fall Restrictions Weight Bearing Restrictions: No Pain:   No/ denies pain  See Function Navigator for Current Functional Status.   Therapy/Group: Individual Therapy  Lewis, Christifer Chapdelaine C 10/13/2015, 3:09 PM

## 2015-10-13 NOTE — Progress Notes (Signed)
Occupational Therapy Session Note  Patient Details  Name: Wynell Brigance MRN: [id number] Date of Birth: 1966-03-03  Today's Date: 10/13/2015 OT Individual Time: 1000-1100 OT Individual Time Calculation (min): 60 min    Short Term Goals: Week 1:  OT Short Term Goal 1 (Week 1): STGs equal to LTGs set at modified independent to independent  Skilled Therapeutic Interventions/Progress Updates:    Session One: Pt seen for OT ADL bathing and dressing session. Pt in w/c at sink completing oral care upon arrival, agreeable to tx session. She ambulated throughout room at supervision- mod I level. She completed bathing task seated on tub bench mod I and dressed seated on toilet.  Discussed with pt pros and cons of using shower chair versus standing showering task. Will trial showering seated today and standing tomorrow in order to assess how comfortable pt is with both and which she prefers. Discussed proper ways to cover PICC line in prep for showering task as pt going home with PICC line. Discussed energy conservation with showering tasks and referenced handout provided to pt on Monday regarding energy conservation.  Pt ambulated throughout unit at supervision level with min cuing for awareness of environmental obstacles. Pt left in hallway with hand off to PT. Educated throughout session regarding ways to incorperate therapy into everyday tasks, handout provided and will review at next session.   Therapy Documentation Precautions:  Precautions Precautions: Fall Restrictions Weight Bearing Restrictions: No Pain: Pain Assessment Pain Assessment: 0-10 Pain Score: 0-No pain   See Function Navigator for Current Functional Status.   Therapy/Group: Individual Therapy  Lewis, Fatumata Kashani C 10/13/2015, 7:14 AM

## 2015-10-13 NOTE — Progress Notes (Signed)
Physical Therapy Session Note  Patient Details  Name: Kortnie Nordmark MRN: [id number] Date of Birth: Aug 06, 1966  Today's Date: 10/13/2015 PT Individual Time: 1445-1525 PT Individual Time Calculation (min): 40 min   Short Term Goals: Week 1:  PT Short Term Goal 1 (Week 1): Pt will increase bed mobility to S.  PT Short Term Goal 2 (Week 1): Pt will increase transfers to S.  PT Short Term Goal 3 (Week 1): Pt will increaseambulation to S about 150 feet.  PT Short Term Goal 4 (Week 1): Pt will ascend/descend 1 stairs with no rails and S.  PT Short Term Goal 5 (Week 1): Pt will ascend/descend fligth of stairs with S.   Skilled Therapeutic Interventions/Progress Updates:   Education in regards to d/c planning and ways to incorporate HEP into daily life. Handout given for [facility] Level B exercises for neuro re-ed to address balance including figure eight walking, lateral sidestepping, tandem balance, tandem gait, retro gait, single limb balance, and mini squats at overall supervision level. Pt performed functional transfers and mobility in room at modified independent level for extra time. Made pt modified independent in room and RN aware.   Therapy Documentation Precautions:  Precautions Precautions: Fall Restrictions Weight Bearing Restrictions: No   Pain:  Denies pain. Reporting anxiety due to planning for staples to be removed after session.   See Function Navigator for Current Functional Status.   Therapy/Group: Individual Therapy  Canary Brim Ivory Broad, PT, DPT  10/13/2015, 3:44 PM

## 2015-10-13 NOTE — Progress Notes (Addendum)
[facility] PHYSICAL MEDICINE & REHABILITATION     PROGRESS NOTE    Subjective/Complaints: Outing went well. Slept without issues last night. Minimal headache. Anxious about having staples removed from head.  ROS: Pt denies fever, rash/itching,   blurred or double vision, nausea, vomiting, abdominal pain, diarrhea, chest pain, shortness of breath, palpitations, dysuria, dizziness, neck or back pain, bleeding,  or depression   Objective: Vital Signs: Blood pressure 141/79, pulse 87, temperature 98.1 F (36.7 C), temperature source Oral, resp. rate 16, height 5\' 9"  (1.753 m), weight 123.651 kg (272 lb 9.6 oz), last menstrual period 09/27/2011, SpO2 95 %. No results found. No results for input(s): WBC, HGB, HCT, PLT in the last 72 hours. No results for input(s): NA, K, CL, GLUCOSE, BUN, CREATININE, CALCIUM in the last 72 hours.  Invalid input(s): CO CBG (last 3)  No results for input(s): GLUCAP in the last 72 hours.  Wt Readings from Last 3 Encounters:  10/12/15 123.651 kg (272 lb 9.6 oz)  09/28/15 113.399 kg (250 lb)  07/26/15 117.935 kg (260 lb)    Physical Exam:  Constitutional: She is oriented to person, place, and time. She appears well-developed and well-nourished.  HENT: #18 site s/p extraction. Area appears to be healing nicely Head: Normocephalic. Crani incision intact with staples Right Ear: External ear normal.  Left Ear: External ear normal.  Eyes: Conjunctivae and EOM are normal.  Neck: Normal range of motion. Neck supple. No thyromegaly present.  Cardiovascular: Normal rate and regular rhythm. No murmurs, rubs, or gallops Respiratory: Effort normal and breath sounds normal. No respiratory distress. lungs are clear GI: Soft. Bowel sounds are normal. She exhibits no distension.  Musculoskeletal:  PROM No tenderness in extremities  Neurological: She is alert and oriented to person, place, and time. Seems to have good insight and awareness,   Follows full  commands Sensation 1+/2 LUE and LLE, perhaps more diminished distal left leg Motor: R UE/RLE: 5/5 proximal distal L UE:  4 delt, 4 to 4+/5 bicep,tricep, wrist, HI.  LLE: HF 4- KE 4, ADF/APF 4.  DTRs increased on left side versus right-sided  Skin: Skin is warm and dry.   Psychiatric: Her behavior is normal, pleasant, seems relaxed and rested today.   Assessment/Plan: 1. 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 Physiatrist is providing close team supervision and 24 hour management of active medical problems listed below. Physiatrist and rehab team continue to assess barriers to discharge/monitor patient progress toward functional and medical goals.  Function:  Bathing Bathing position   Position: Wheelchair/chair at sink  Bathing parts Body parts bathed by patient: Right arm, Chest, Abdomen, Left arm, Left lower leg, Right lower leg, Left upper leg, Right upper leg    Bathing assist Assist Level: Supervision or verbal cues      Upper Body Dressing/Undressing Upper body dressing   What is the patient wearing?: Hospital gown Bra - Perfomed by patient: Thread/unthread right bra strap, Thread/unthread left bra strap Bra - Perfomed by helper: Hook/unhook bra (pull down sports bra) Pull over shirt/dress - Perfomed by patient: Thread/unthread right sleeve, Thread/unthread left sleeve, Put head through opening, Pull shirt over trunk          Upper body assist        Lower Body Dressing/Undressing Lower body dressing   What is the patient wearing?: Underwear, Pants, Non-skid slipper socks Underwear - Performed by patient: Thread/unthread right  underwear leg, Thread/unthread left underwear leg, Pull underwear up/down   Pants- Performed by patient: Thread/unthread right pants leg, Thread/unthread left pants leg, Pull pants up/down, Fasten/unfasten pants   Non-skid slipper  socks- Performed by patient: Don/doff right sock, Don/doff left sock                    Lower body assist Assist for lower body dressing: Touching or steadying assistance (Pt > 75%)      Toileting Toileting   Toileting steps completed by patient: Adjust clothing prior to toileting, Performs perineal hygiene, Adjust clothing after toileting   Toileting Assistive Devices: Grab bar or rail  Toileting assist Assist level: No help/no cues   Transfers Chair/bed transfer   Chair/bed transfer method: Ambulatory Chair/bed transfer assist level: Supervision or verbal cues Chair/bed transfer assistive device: Armrests     Locomotion Ambulation     Max distance: 150 Assist level: Supervision or verbal cues   Wheelchair     Max wheelchair distance: 100 Assist Level: Supervision or verbal cues  Cognition Comprehension Comprehension assist level: Follows complex conversation/direction with no assist  Expression Expression assist level: Expresses complex ideas: With no assist  Social Interaction Social Interaction assist level: Interacts appropriately with others - No medications needed.  Problem Solving Problem solving assist level: Solves complex problems: Recognizes & self-corrects  Memory Memory assist level: Complete Independence: No helper   Medical Problem List and Plan: 1. 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  -outing went well. Working toward [employer] 2. DVT Prophylaxis/Anticoagulation: SCDs and ambulation 3. Pain Management: Percocet as needed. Monitor with increased activity in therapy 4. Mood: Xanax 0.25 mg 3 times a day as needed.   -xanax scheduled dose to help with sleep. 5. Neuropsych: This patient is capable of making decisions on her own behalf. 6. Skin/Wound Care: Routine skin checks. Remove staples tomorrow 7. Fluids/Electrolytes/Nutrition:  potassium repletion -encouraging adequate PO intake 8. ID. Gram stain with gram-negative rods/GPC in pairs.Flagyl 500 mg QID. Ceftriaxone 6 weeks--stop date in March  -afebrile  -cbc personally reviewed, wbc 3.5, hgb 10.6   -follow up with ID as outpt 9. Tooth extraction   #18. Minimal pain. Tolerating regular diet. Area healing nicely 10. Constipation. Laxative assistance 11. Asthma: lungs completely clear, sx  -albuterol/atrovent HHN q4 prn   -IS/OOB.   -   LOS (Days) 6 A FACE TO FACE EVALUATION WAS PERFORMED  SWARTZ,ZACHARY T 10/13/2015 8:58 AM

## 2015-10-14 ENCOUNTER — Inpatient Hospital Stay (HOSPITAL_COMMUNITY): Payer: Self-pay | Admitting: Occupational Therapy

## 2015-10-14 ENCOUNTER — Inpatient Hospital Stay (HOSPITAL_COMMUNITY): Payer: Medicaid Other

## 2015-10-14 MED ORDER — METRONIDAZOLE 500 MG PO TABS: ORAL_TABLET | ORAL | Status: DC

## 2015-10-14 MED ORDER — ALPRAZOLAM 0.25 MG PO TABS: 0.2500 mg | ORAL_TABLET | Freq: Three times a day (TID) | ORAL | Status: DC | PRN

## 2015-10-14 MED ORDER — DEXAMETHASONE 2 MG PO TABS: 2.0000 mg | ORAL_TABLET | Freq: Two times a day (BID) | ORAL | Status: DC

## 2015-10-14 MED ORDER — ASPIRIN 81 MG PO TBEC: 81.0000 mg | DELAYED_RELEASE_TABLET | Freq: Every day | ORAL | Status: DC

## 2015-10-14 MED ORDER — ALBUTEROL SULFATE HFA 108 (90 BASE) MCG/ACT IN AERS: 2.0000 | INHALATION_SPRAY | Freq: Four times a day (QID) | RESPIRATORY_TRACT | Status: AC | PRN

## 2015-10-14 MED ORDER — OXYCODONE-ACETAMINOPHEN 5-325 MG PO TABS: 1.0000 | ORAL_TABLET | ORAL | Status: DC | PRN

## 2015-10-14 MED ORDER — PANTOPRAZOLE SODIUM 40 MG PO TBEC: 40.0000 mg | DELAYED_RELEASE_TABLET | Freq: Every day | ORAL | Status: DC

## 2015-10-14 NOTE — Progress Notes (Signed)
Occupational Therapy Discharge Summary  Patient Details  Name: [name] MRN: [id number] Date of Birth: Feb 14, 1966   Patient has met 80 of 98 long term goals due to improved activity tolerance, improved balance, postural control, functional use of  LEFT upper extremity and improved coordination.  Patient to discharge at overall Modified Independent level.  Patient's care partner is independent to provide the necessary physical assistance at discharge.    Recommendation:  Patient with no further OT needs at this time.   Equipment: Shower chair  Reasons for discharge: treatment goals met and discharge from hospital  Patient/family agrees with progress made and goals achieved: Yes  OT Discharge Precautions/Restrictions  Precautions Precautions: Fall Restrictions Weight Bearing Restrictions: No Vision/Perception  Vision- History Baseline Vision/History: Wears glasses Wears Glasses: Reading only Patient Visual Report: No change from baseline Vision- Assessment Vision Assessment?: No apparent visual deficits Perception Comments: Voices some problemswith depth perception, however, does not appear to affect function.   Cognition Overall Cognitive Status: Within Functional Limits for tasks assessed Arousal/Alertness: Awake/alert Orientation Level: Oriented X4 Sustained Attention: Appears intact Selective Attention: Appears intact Memory: Appears intact Problem Solving: Appears intact Safety/Judgment: Appears intact Comments: Pt anxious and tends to over analyze simple tasks.  Sensation Sensation Light Touch: Appears Intact Light Touch Impaired Details: Impaired LLE Stereognosis: Appears Intact Hot/Cold: Appears Intact Proprioception: Appears Intact Coordination Gross Motor Movements are Fluid and Coordinated: Yes Fine Motor Movements are Fluid and Coordinated: No 9 Hole Peg Test: R: 20.6 second average    L: 25.3 second average Motor  Motor Motor: Hemiplegia Motor -  Discharge Observations: Pt reports some weakness still in L but significantly improved since admission and very mild Trunk/Postural Assessment  Cervical Assessment Cervical Assessment: Within Functional Limits Thoracic Assessment Thoracic Assessment: Within Functional Limits Lumbar Assessment Lumbar Assessment: Within Functional Limits Postural Control Postural Control: Within Functional Limits  Balance Balance Balance Assessed: Yes Static Sitting Balance Static Sitting - Balance Support: Feet unsupported Static Sitting - Level of Assistance: 6: Modified independent (Device/Increase time);7: Independent Dynamic Sitting Balance Dynamic Sitting - Balance Support: Feet supported Dynamic Sitting - Level of Assistance: 7: Independent Static Standing Balance Static Standing - Balance Support: During functional activity Static Standing - Level of Assistance: 6: Modified independent (Device/Increase time) Dynamic Standing Balance Dynamic Standing - Balance Support: During functional activity Dynamic Standing - Level of Assistance: 6: Modified independent (Device/Increase time) Extremity/Trunk Assessment RUE Assessment RUE Assessment: Within Functional Limits LUE Assessment LUE Assessment: Within Functional Limits (4+5/ overall)   See Function Navigator for Current Functional Status.  Lewis, Vang Kraeger C 10/14/2015, 9:16 AM

## 2015-10-14 NOTE — Progress Notes (Signed)
Social Work  Discharge Note  The overall goal for the admission was met for:   Discharge location: Yes - as planned, pt will d/c to sister's home until she feels ready to return to her own home.  Intermittent support available.  Length of Stay: Yes - 8 days (with 1/28 discharge date)  Discharge activity level: Yes - modified independent  Home/community participation: Yes  Services provided included: MD, RD, PT, OT, SLP, RN, TR, Pharmacy, Neuropsych and SW  Financial Services: Medicaid  Follow-up services arranged: Home Health: RN for IV abx management - [place], DME: tub seat via [facility] and Patient/Family has no preference for HH/DME agencies  Comments (or additional information):  Patient/Family verbalized understanding of follow-up arrangements: Yes  Individual responsible for coordination of the follow-up plan: pt  Confirmed correct DME delivered: Denese Mentink 10/14/2015    Chrisma Hurlock

## 2015-10-14 NOTE — Progress Notes (Signed)
Physical Therapy Session Note  Patient Details  Name: [name] MRN: [id number] Date of Birth: 12/12/1965  Today's Date: 10/14/2015 PT Individual Time: 1045-1150 PT Individual Time Calculation (min): 65 min  Pt missed 10 min due to fatigue  Short Term Goals: Week 1:  PT Short Term Goal 1 (Week 1): Pt will increase bed mobility to S.  PT Short Term Goal 2 (Week 1): Pt will increase transfers to S.  PT Short Term Goal 3 (Week 1): Pt will increaseambulation to S about 150 feet.  PT Short Term Goal 4 (Week 1): Pt will ascend/descend 1 stairs with no rails and S.  PT Short Term Goal 5 (Week 1): Pt will ascend/descend fligth of stairs with S.   Skilled Therapeutic Interventions/Progress Updates:    Session focused on functional gait on unit, endurance, simulated car transfer to Bolt height (with step to simulate runningboard), functional balance, stair negotiation in stairwell completing 3 flights of stairs to simulate home environment access at modified independent level, readministered Berg Balance Test (see results in d/c summary), and addressed overall d/c planning and education in regards to energy conservation, adjusting to home, and re-entry to community. Pt missed last 10 min of session due to fatigue.  Therapy Documentation Precautions:  Precautions Precautions: Fall Restrictions Weight Bearing Restrictions: No  Pain: Denies pain, complaining of being exhausted and worried about personal business issues that she wants to deal with prior to d/c. Emotional support provided and reminded pt of her schedule and available time today.     See Function Navigator for Current Functional Status.   Therapy/Group: Individual Therapy  Canary Brim Ivory Broad, PT, DPT  10/14/2015, 12:09 PM

## 2015-10-14 NOTE — Progress Notes (Signed)
Physical Therapy Discharge Summary  Patient Details  Name: [name] MRN: [id number] Date of Birth: Feb 03, 1966  Today's Date: 10/14/2015 PT Individual Time: 1400-1439 PT Individual Time Calculation (min): 39 min  Pt reporting overall fatigue and exhaustion from the day. Pt performing functional mobility in the room including gait,  toileting, and dynamic balance while performing household tasks such as adjusting thermostat and various transfers/picking up items from the floor at overall modified independent level. Throughout these tasks education reviewed on home management, safety in the home, energy conservation, and pt's long term mobility goals. Pt denies concerns at this time and expresses her gratitude towards her rehab stay. Session ended early due to pt's fatigue and declining engaging in further therapeutic activities (missing 6 min).   Patient has met 7 of 7 long term goals due to improved activity tolerance, improved balance, increased strength, ability to compensate for deficits, functional use of  left upper extremity and left lower extremity and improved coordination.  Patient to discharge at an ambulatory level modified independent to independent.  Pt is to d/c home with her sister who will help with care of pt's 2 young sons.   Reasons goals not met: n/a - all goals met at this time.  Recommendation:  Patient does not qualify for follow-up PT services per her insurance. HEP has been given and reviewed with patient as well as recommendation for yoga/pilates to continue to address higher level balance and coordination limitations.   Equipment: No equipment provided  Reasons for discharge: treatment goals met and discharge from hospital  Patient/family agrees with progress made and goals achieved: Yes  PT Discharge Vision/Perception  Perception Comments: Voices some problemswith depth perception, however, does not appear to affect function.   Cognition Overall Cognitive  Status: Within Functional Limits for tasks assessed Arousal/Alertness: Awake/alert Orientation Level: Oriented X4 Sustained Attention: Appears intact Selective Attention: Appears intact Memory: Appears intact Problem Solving: Appears intact Safety/Judgment: Appears intact Comments: Pt anxious and tends to over analyze simple tasks.  Sensation Sensation Light Touch: Appears Intact Proprioception: Appears Intact Coordination Gross Motor Movements are Fluid and Coordinated: Yes Motor  Motor Motor: Hemiplegia (hemiplegia has improved significantly) Motor - Discharge Observations: Pt reports some weakness still in L but significantly improved since admission and very mild  Trunk/Postural Assessment  Cervical Assessment Cervical Assessment: Within Functional Limits Thoracic Assessment Thoracic Assessment: Within Functional Limits Lumbar Assessment Lumbar Assessment: Within Functional Limits Postural Control Postural Control: Within Functional Limits  Balance Balance Balance Assessed: Yes Standardized Balance Assessment Standardized Balance Assessment: Berg Balance Test Berg Balance Test Sit to Stand: Able to stand without using hands and stabilize independently Standing Unsupported: Able to stand safely 2 minutes Sitting with Back Unsupported but Feet Supported on Floor or Stool: Able to sit safely and securely 2 minutes Stand to Sit: Sits safely with minimal use of hands Transfers: Able to transfer safely, minor use of hands Standing Unsupported with Eyes Closed: Able to stand 10 seconds safely Standing Ubsupported with Feet Together: Able to place feet together independently and stand 1 minute safely From Standing, Reach Forward with Outstretched Arm: Can reach confidently >25 cm (10") From Standing Position, Pick up Object from Floor: Able to pick up shoe safely and easily From Standing Position, Turn to Look Behind Over each Shoulder: Looks behind from both sides and weight  shifts well Turn 360 Degrees: Able to turn 360 degrees safely in 4 seconds or less Standing Unsupported, Alternately Place Feet on Step/Stool: Able to stand independently and  complete 8 steps >20 seconds Standing Unsupported, One Foot in Front: Able to place foot tandem independently and hold 30 seconds Standing on One Leg: Able to lift leg independently and hold 5-10 seconds Total Score: 54 Static Sitting Balance Static Sitting - Balance Support: Feet unsupported Static Sitting - Level of Assistance: 7: Independent Dynamic Sitting Balance Dynamic Sitting - Balance Support: Feet supported Dynamic Sitting - Level of Assistance: 7: Independent Static Standing Balance Static Standing - Balance Support: During functional activity Static Standing - Level of Assistance: 6: Modified independent (Device/Increase time);7: Independent Dynamic Standing Balance Dynamic Standing - Balance Support: During functional activity Dynamic Standing - Level of Assistance: 6: Modified independent (Device/Increase time) Extremity Assessment   see OT d/c summary for UE details   RLE Assessment RLE Assessment: Within Functional Limits LLE Strength LLE Overall Strength Comments: grossly appears WFL with MMT; pt reports weakness at times functionally   See Function Navigator for Current Functional Status.  Canary Brim Ivory Broad, PT, DPT  10/14/2015, 3:32 PM

## 2015-10-14 NOTE — Progress Notes (Signed)
[facility]     PROGRESS NOTE    Subjective/Complaints: Didn't sleep as well last night. More anxious to go home than anything else. Small headache  ROS: Pt denies fever, rash/itching,   blurred or double vision, nausea, vomiting, abdominal pain, diarrhea, chest pain, shortness of breath, palpitations, dysuria, dizziness, neck or back pain, bleeding,  or depression   Objective: Vital Signs: Blood pressure 113/52, pulse 67, temperature 98.5 F (36.9 C), temperature source Oral, resp. rate 18, height 5\' 9"  (1.753 m), weight 123.651 kg (272 lb 9.6 oz), last menstrual period 09/27/2011, SpO2 97 %. No results found. No results for input(s): WBC, HGB, HCT, PLT in the last 72 hours. No results for input(s): NA, K, CL, GLUCOSE, BUN, CREATININE, CALCIUM in the last 72 hours.  Invalid input(s): CO CBG (last 3)  No results for input(s): GLUCAP in the last 72 hours.  Wt Readings from Last 3 Encounters:  10/12/15 123.651 kg (272 lb 9.6 oz)  09/28/15 113.399 kg (250 lb)  07/26/15 117.935 kg (260 lb)    Physical Exam:  Constitutional: She is oriented to person, place, and time. She appears well-developed and well-nourished.  HENT: #18 site s/p extraction. Area appears to be healing nicely Head: Normocephalic. Crani incision intact with staples Right Ear: External ear normal.  Left Ear: External ear normal.  Eyes: Conjunctivae and EOM are normal.  Neck: Normal range of motion. Neck supple. No thyromegaly present.  Cardiovascular: Normal rate and regular rhythm. No murmurs, rubs, or gallops Respiratory: Effort normal and breath sounds normal. No respiratory distress. lungs are clear GI: Soft. Bowel sounds are normal. She exhibits no distension.  Musculoskeletal:  PROM No tenderness in extremities  Neurological: She is alert and oriented to person, place, and time. Seems to have good insight and awareness,   Follows full commands Sensation 1+/2 LUE  and LLE, perhaps more diminished distal left leg Motor: R UE/RLE: 5/5 proximal distal L UE:  4 delt, 4 to 4+/5 bicep,tricep, wrist, HI.  LLE: HF 4- KE 4, ADF/APF 4.  DTRs increased on left side  Skin: Skin is warm and dry.   Psychiatric: Her behavior is normal, pleasant, seems relaxed and rested today.   Assessment/Plan: 1. 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 Physiatrist is providing close team supervision and 24 hour management of active medical problems listed below. Physiatrist and rehab team continue to assess barriers to discharge/monitor patient progress toward functional and medical goals.  Function:  Bathing Bathing position   Position: Shower  Bathing parts Body parts bathed by patient: Right arm, Chest, Abdomen, Left arm, Left lower leg, Right lower leg, Left upper leg, Right upper leg, Front perineal area, Buttocks, Back    Bathing assist Assist Level: No help, No cues      Upper Body Dressing/Undressing Upper body dressing   What is the patient wearing?: Bra, Pull over shirt/dress Bra - Perfomed by patient: Thread/unthread right bra strap, Thread/unthread left bra strap, Hook/unhook bra (pull down sports bra) Bra - Perfomed by helper: Hook/unhook bra (pull down sports bra) Pull over shirt/dress - Perfomed by patient: Thread/unthread right sleeve, Thread/unthread left sleeve, Put head through opening, Pull shirt over trunk          Upper body assist Assist Level: More than reasonable time      Lower Body Dressing/Undressing Lower body dressing   What is the patient wearing?:  Underwear, Pants, Shoes Underwear - Performed by patient: Thread/unthread right underwear leg, Thread/unthread left underwear leg, Pull underwear up/down   Pants- Performed by patient: Thread/unthread right pants leg, Thread/unthread left pants leg, Pull pants up/down,  Fasten/unfasten pants   Non-skid slipper socks- Performed by patient: Don/doff right sock, Don/doff left sock       Shoes - Performed by patient: Don/doff right shoe, Don/doff left shoe (Slide on)            Lower body assist Assist for lower body dressing: More than reasonable time      Toileting Toileting   Toileting steps completed by patient: Adjust clothing prior to toileting, Performs perineal hygiene, Adjust clothing after toileting   Toileting Assistive Devices: Grab bar or rail  Toileting assist Assist level: No help/no cues   Transfers Chair/bed transfer   Chair/bed transfer method: Ambulatory Chair/bed transfer assist level: Supervision or verbal cues Chair/bed transfer assistive device: Armrests     Locomotion Ambulation     Max distance: 150 Assist level: Supervision or verbal cues   Wheelchair     Max wheelchair distance: 100 Assist Level: Supervision or verbal cues  Cognition Comprehension Comprehension assist level: Follows complex conversation/direction with no assist  Expression Expression assist level: Expresses complex ideas: With no assist  Social Interaction Social Interaction assist level: Interacts appropriately with others - No medications needed.  Problem Solving Problem solving assist level: Solves complex problems: Recognizes & self-corrects  Memory Memory assist level: Complete Independence: No helper   Medical Problem List and Plan: 1. 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  -finalizing dc planning for saturday 2. DVT Prophylaxis/Anticoagulation: SCDs and ambulation 3. Pain Management: Percocet as needed. Monitor with increased activity in therapy 4. Mood: Xanax 0.25 mg 3 times a day as needed.   -xanax scheduled dose to help with sleep.===can go home with prn xanax script if she desires 5. Neuropsych: This patient is  capable of making decisions on her own behalf. 6. Skin/Wound Care: Routine skin checks. Remove staples tomorrow 7. Fluids/Electrolytes/Nutrition: potassium repletion -encouraging adequate PO intake 8. ID. Gram stain with gram-negative rods/GPC in pairs.Flagyl 500 mg QID. Ceftriaxone 6 weeks--stop in March  -afebrile  -cbc personally reviewed, wbc 3.5, hgb 10.6   -follow up with ID as outpt 9. Tooth extraction   #18. Minimal pain. Tolerating regular diet. Area healing nicely 10. Constipation. Laxative assistance 11. Asthma: lungs completely clear, sx  -albuterol/atrovent HHN q4 prn   -IS/OOB.   - resume MDI's at home  LOS (Days) 7 A FACE TO FACE EVALUATION WAS PERFORMED  SWARTZ,ZACHARY T 10/14/2015 10:33 AM

## 2015-10-14 NOTE — Progress Notes (Signed)
Occupational Therapy Session Note  Patient Details  Name: [name] MRN: [id number] Date of Birth: 08-08-1966  Today's Date: 10/14/2015 OT Individual Time: 0845-1000 OT Individual Time Calculation (min): 75 min    Short Term Goals: Week 1:  OT Short Term Goal 1 (Week 1): STGs equal to LTGs set at modified independent to independent  Skilled Therapeutic Interventions/Progress Updates:    Pt seen for OT ADL bathing and dressing session. Pt sitting up in bed upon arrival, agreeable to tx session. She voiced feeling increased fatigue from yesterday, worried that she over did it. Educated regarding energy conservation,prioritizing tasks, and d/c planning with pt and friend who was present.    Pt ambulated throughout room mod I- independent, making bed and gathering clothing in prep for showering task.  She bathed seated on shower chair for energy conservation.  Pt ambulated to ADL kitchen where she completed simulated simple meal prep, gathering pots/pans and items from cabinet/ refrigerator. Completed all kitchen mobility at independent level. Discussed kitchen set-up, energy conservation techniques with cooking tasks, delegating tasks, and prioritizing.  Pt completed 9 hole peg test. See results below. Pt left ambulating back to room at end of session, she is independent within her room, all needs and questions answered.  Educated pt throughout session regarding OT goals, energy conservation, reducing risk of falls, and d/c planning.   9 hole peg test:  R: 21,22,19  Avg: 20.6 sec LL 27,25,24 Avg:25.3 sec  Therapy Documentation Precautions:  Precautions Precautions: Fall Restrictions Weight Bearing Restrictions: No Pain: Pain Assessment Pain Assessment: 0-10 Pain Score: no/ denies pain See Function Navigator for Current Functional Status.   Therapy/Group: Individual Therapy  Lewis, Juleah Paradise C 10/14/2015, 7:13 AM

## 2015-10-14 NOTE — Discharge Summary (Signed)
[name], [name] NO.:  [id number]  MEDICAL RECORD NO.:  [id number]  LOCATION:  [id number]                        FACILITY:  [facility]  PHYSICIAN:  Meredith Staggers, M.D.DATE OF BIRTH:  23-Jan-1966  DATE OF ADMISSION:  10/07/2015 DATE OF DISCHARGE:  10/15/2015                              DISCHARGE SUMMARY   DISCHARGE DIAGNOSES: 1. Left-sided weakness and headache secondary to brain abscess likely     secondary to dental caries. 2. SCDs for deep vein thrombosis prophylaxis. 3. Pain management. 4. Anxiety. 5. Gram-negative rods-GPC in pairs with ceftriaxone and Flagyl x6     weeks. 6. Tooth extraction. 7. Constipation, resolved.  HISTORY OF PRESENT ILLNESS:  This is a 50 year old right-handed female with history of asthma, recent tooth infection who lives alone with 2 young children.  Presented on October 02, 2015, with sudden onset of headache, left-sided weakness.  CT and MRI of the brain demonstrated a ring enhancing right post central lesion suspicious for abscess. Underwent stereotactic right frontoparietal craniotomy for resection of lesion on October 03, 2015, per Dr. Kathyrn Sheriff, with Decadron protocol as advised.  Gram stain with gram-negative rods but also GPC in pairs. Infectious Disease consulted.  Initially, on Rocephin and Flagyl and would continue for 6 weeks.  Underwent tooth extraction on October 07, 2015, for recent tooth infection.  Physical and occupational therapy ongoing.  The patient was admitted for a comprehensive rehab program.  PAST MEDICAL HISTORY:  See discharge diagnoses.  SOCIAL HISTORY:  Lives alone, 2 young children, independent prior to admission.  FUNCTIONAL STATUS UPON ADMISSION TO REHAB SERVICES:  Min assist 12 feet to person handheld assistance.  Minimal assist sit to stand, min assist stand pivot transfers, min to mod assist activities of daily living.  PHYSICAL EXAMINATION:  VITAL SIGNS:  Blood pressure 125/50, pulse  78, temperature 98, respirations 16. GENERAL:  This was an alert female, oriented x3. LUNGS:  Clear to auscultation without wheeze. CARDIAC:  Regular rate and rhythm without murmur. ABDOMEN:  Soft, nontender.  Good bowel sounds.  REHABILITATION HOSPITAL COURSE:  The patient was admitted to inpatient rehab services with therapies initiated on a 3-hour daily basis consisting of physical therapy, occupational therapy, and rehabilitation nursing.  The following issues were addressed during the patient's rehabilitation stay.  Pertaining to Ms. Glodowski's brain abscess likely secondary to dental caries for which she underwent tooth extraction. She would follow up Dr. Kathyrn Sheriff after stereotactic right frontoparietal craniotomy.  Decadron taper as advised.  Infectious Disease followup for Gram stain, Gram negative rods, GPC in pairs.  She remained on Rocephin as well as Flagyl through November 18, 2015.  She would follow up Dr. Linus Salmons, [phone] and a home health nurse had been arranged for antibiotic therapy.  She did remain afebrile.  Blood pressure is well controlled.  She is maintained on a regular diet.  Bouts of constipation resolved with laxative assistance.  The patient received weekly collaborative interdisciplinary team conferences to discuss estimated length of stay, family teaching, and any barriers to discharge.  Sessions focused on functional gait on the unit with focus on awareness to the left side.  Stair negotiation up and down stairs, supervision,  floor transfer training to simulate getting off the floor with supervision.  She could gather her belongings for activities of daily living and homemaking.  Demonstrated good functional dynamic standing balance.  Educated regarding all laundry modifications for energy conservation and hygiene.  Full family teaching was completed and plan was discharged to home.  DISCHARGE MEDICATIONS: 1. Xanax 0.25 mg t.i.d. as needed and 0.5 mg at  bedtime. 2. Aspirin 81 mg p.o. daily. 3. Rocephin 2 g every 12 hours until November 18, 2015, and stop. 4. Decadron taper as advised. 5. Flagyl 500 mg p.o. every 6 hours. 6. Percocet 1-2 tablets every 4 hours as needed pain, dispense of 90     tablets. 7. Protonix 40 mg p.o. daily.  DIET:  Regular.  FOLLOWUP:  Dr. Alger Simons at the outpatient rehab service office as directed; Dr. Kathyrn Sheriff, Neurosurgery, 2 weeks call for appointment; Dr. Scharlene Gloss, Infectious Disease 3-4 weeks; Dr. Teena Dunk, Dental Clinic as directed.  Home health nurse was arranged to continue intravenous Rocephin 2 g every 12 hours as well as Flagyl 500 mg q.i.d. until November 18, 2015, and stop.  The patient was advised no driving.     Lauraine Rinne, P.A.   ______________________________ Meredith Staggers, M.D.    DA/MEDQ  D:  10/14/2015  T:  10/14/2015  Job:  [id number]  cc:   Teena Dunk, D.D.S. Thayer Headings, MD Dr. Kathyrn Sheriff

## 2015-10-14 NOTE — Discharge Summary (Signed)
Discharge summary job [phone]

## 2015-10-14 NOTE — Discharge Instructions (Signed)
Inpatient Rehab Discharge Instructions  Kandiss Holyoak Discharge date and time: No discharge date for patient encounter.   Activities/Precautions/ Functional Status: Activity: activity as tolerated Diet: regular diet Wound Care: keep wound clean and dry Functional status:  ___ No restrictions     ___ Walk up steps independently ___ 24/7 supervision/assistance   ___ Walk up steps with assistance ___ Intermittent supervision/assistance  ___ Bathe/dress independently ___ Walk with walker     _x__ Bathe/dress with assistance ___ Walk Independently    ___ Shower independently _x__ Walk with assistance    ___ Shower with assistance ___ No alcohol     ___ Return to work/school ________  COMMUNITY REFERRALS UPON DISCHARGE:    Home Health:    RN                      Agency:  [facility]  Phone: [phone]   Medical Equipment/Items Ordered:  Tub seat                                                      Agency/Supplier:  [id number]       Special Instructions: No driving   Continue intravenous ceftriaxone 2 g every 12 hours and Flagyl 500 mg 4 times daily until 11/18/2015. Per Dr. Linus Salmons infectious disease [phone]  My questions have been answered and I understand these instructions. I will adhere to these goals and the provided educational materials after my discharge from the hospital.  Patient/Caregiver Signature _______________________________ Date __________  Clinician Signature _______________________________________ Date __________  Please bring this form and your medication list with you to all your follow-up doctor's appointments.

## 2015-10-15 MED ORDER — HEPARIN SOD (PORK) LOCK FLUSH 100 UNIT/ML IV SOLN
250.0000 [IU] | INTRAVENOUS | Status: AC | PRN
  Administered 2015-10-15: 250 [IU]

## 2015-10-15 NOTE — Progress Notes (Signed)
[name] is a 50 y.o. female 1966/02/25 [id number]  Subjective: No new complaints. No new problems. Slept well. Feeling great - Ready to go home!  Objective: Vital signs in last 24 hours: Temp:  [97.5 F (36.4 C)-97.8 F (36.6 C)] 97.5 F (36.4 C) (01/28 0517) Pulse Rate:  [76-82] 82 (01/28 0517) Resp:  [17-18] 17 (01/28 0517) BP: (144-147)/(70) 144/70 mmHg (01/28 0517) SpO2:  [93 %-96 %] 93 % (01/28 0517) Weight change:  Last BM Date: 10/13/15  Intake/Output from previous day: 01/27 0701 - 01/28 0700 In: 720 [P.O.:720] Out: -   Physical Exam General: No apparent distress   Friend at side Lungs: Normal effort. Lungs clear to auscultation, no crackles or wheezes. Cardiovascular: Regular rate and rhythm, no edema Musculoskeletal:  Neurovascularly intact Neurological: No new neurological deficits Wounds: PICC site dressing  Clean, dry, intact. No signs of infection.  Lab Results: BMET    Component Value Date/Time   NA 140 10/10/2015 0537   K 3.4* 10/10/2015 0537   CL 105 10/10/2015 0537   CO2 28 10/10/2015 0537   GLUCOSE 219* 10/10/2015 0537   BUN 9 10/10/2015 0537   CREATININE 0.72 10/10/2015 0537   CALCIUM 8.1* 10/10/2015 0537   GFRNONAA >60 10/10/2015 0537   GFRAA >60 10/10/2015 0537   CBC    Component Value Date/Time   WBC 3.5* 10/10/2015 0537   RBC 3.22* 10/10/2015 0537   HGB 10.6* 10/10/2015 0537   HCT 30.6* 10/10/2015 0537   PLT 291 10/10/2015 0537   MCV 95.0 10/10/2015 0537   MCH 32.9 10/10/2015 0537   MCHC 34.6 10/10/2015 0537   RDW 12.6 10/10/2015 0537   LYMPHSABS 1.2 10/10/2015 0537   MONOABS 0.4 10/10/2015 0537   EOSABS 0.0 10/10/2015 0537   BASOSABS 0.0 10/10/2015 0537   CBG's (last 3):  No results for input(s): GLUCAP in the last 72 hours. LFT's Lab Results  Component Value Date   ALT 12* 10/10/2015   AST 20 10/10/2015   ALKPHOS 59 10/10/2015   BILITOT 0.2* 10/10/2015    Studies/Results: No results found.  Medications:  I  have reviewed the patient's current medications. Scheduled Medications: . ALPRAZolam  0.5 mg Oral QHS  . aspirin EC  81 mg Oral Daily  . cefTRIAXone (ROCEPHIN)  IV  2 g Intravenous Q12H  . dexamethasone  2 mg Oral Q12H  . dexamethasone  4 mg Oral Q12H  . docusate sodium  200 mg Oral BID  . metroNIDAZOLE  500 mg Oral Q6H  . pantoprazole  40 mg Oral QHS  . potassium chloride  20 mEq Oral Daily  . senna  1 tablet Oral BID  . sodium chloride  10-40 mL Intracatheter Q12H   PRN Medications: acetaminophen, albuterol, ALPRAZolam, bisacodyl, ipratropium, ondansetron **OR** ondansetron (ZOFRAN) IV, oxyCODONE-acetaminophen, sodium chloride, sorbitol, traZODone  Assessment/Plan: Principal Problem:   Brain abscess Active Problems:   Adjustment disorder with mixed anxiety and depressed mood   Left hemiparesis (HCC)   Abscessed tooth  1. Left-sided weakness and headache secondary to brain abscess likely secondary to dental caries status post tooth extraction -s/p CIR therapies -  -Ready for DC home today to complete medical treatments and follow up as planned 2. DVT Prophylaxis/Anticoagulation: SCDs and ambulation 3. Pain Management: Percocet as needed.  4. Mood: Xanax 0.25 mg 3 times a day as needed.  -xanax scheduled dose to help with sleep.=== home with prn xanax script if she desires 5. Neuropsych: This patient is capable of making decisions  on her own behalf. 6. Skin/Wound Care: staples removed 7. Fluids/Electrolytes/Nutrition: potassium repletion -encouraging adequate PO intake 8. ID. Gram stain with gram-negative rods/GPC in pairs.Flagyl 500 mg QID. Ceftriaxone 6 weeks--stop in March -afebrile -cbc personally reviewed, wbc 3.5, hgb 10.6  -follow up with ID as outpt 9. Tooth extraction #18. Minimal pain. Tolerating regular diet. Area healing nicely 10. Constipation. Laxative assistance 11. Asthma: lungs  completely clear, sx -albuterol/atrovent HHN q4 prn  -IS/OOB.  - resume MDI's at home  Length of stay, days: 8  DC home today as planned - see DC summary prepared 1/27 by PA - no addendum or corrections  Valerie A. Asa Lente, MD 10/15/2015, 9:30 AM

## 2015-10-15 NOTE — Progress Notes (Signed)
Pt given pain medication this am prior to departure. Rates pain at 0 now. Pt left will all belongings. NT escorted Pt to vehicle with friend who will accompany her home. Pt denies any further questions, all questions resolved yesterday with PA discharge conversation.

## 2015-10-31 DIAGNOSIS — G060 Intracranial abscess and granuloma: Secondary | ICD-10-CM | POA: Diagnosis not present

## 2015-10-31 DIAGNOSIS — M069 Rheumatoid arthritis, unspecified: Secondary | ICD-10-CM | POA: Diagnosis not present

## 2015-10-31 DIAGNOSIS — J45909 Unspecified asthma, uncomplicated: Secondary | ICD-10-CM | POA: Diagnosis not present

## 2015-10-31 DIAGNOSIS — K029 Dental caries, unspecified: Secondary | ICD-10-CM | POA: Diagnosis not present

## 2015-11-01 ENCOUNTER — Telehealth: Payer: Self-pay | Admitting: *Deleted

## 2015-11-01 NOTE — Telephone Encounter (Signed)
Relayed information to [place] at [place]

## 2015-11-01 NOTE — Telephone Encounter (Signed)
Was 3.5 on 1/23 so seems to be stable.  Continue with same for now.

## 2015-11-01 NOTE — Telephone Encounter (Signed)
Labs drawn 2/13, received in triage 2/14. WBC dropped, = 3.4. Patient on ceftriaxone and oral flagyl.  Patient scheduled to follow up 2/23. Please advise. Landis Gandy, RN

## 2015-11-02 ENCOUNTER — Telehealth: Payer: Self-pay

## 2015-11-02 MED ORDER — OXYCODONE-ACETAMINOPHEN 5-325 MG PO TABS: 1.0000 | ORAL_TABLET | Freq: Four times a day (QID) | ORAL | Status: DC | PRN

## 2015-11-02 NOTE — Telephone Encounter (Signed)
Pt is calling for a refill on her Percocet. She is still having head pain. She has a follow up appt scheduled with ZS on 11/21/15. Please advise on refill.

## 2015-11-02 NOTE — Telephone Encounter (Signed)
New percocet order written

## 2015-11-03 NOTE — Telephone Encounter (Signed)
Notified Ulah and requested she bring bottle to appointment on 11/21/15

## 2015-11-07 ENCOUNTER — Telehealth: Payer: Self-pay | Admitting: *Deleted

## 2015-11-07 NOTE — Telephone Encounter (Signed)
FYI per [facility]: Patient declined labs today, asked they be drawn 2/21 due to her child being home sick with N/V today. Landis Gandy, RN

## 2015-11-08 NOTE — Telephone Encounter (Signed)
ok 

## 2015-11-10 ENCOUNTER — Encounter: Payer: Self-pay | Admitting: Internal Medicine

## 2015-11-10 ENCOUNTER — Ambulatory Visit (INDEPENDENT_AMBULATORY_CARE_PROVIDER_SITE_OTHER): Payer: Medicaid Other | Admitting: Internal Medicine

## 2015-11-10 VITALS — BP 125/88 | HR 81 | Temp 97.7°F | Ht 70.0 in | Wt 275.0 lb

## 2015-11-10 DIAGNOSIS — F4323 Adjustment disorder with mixed anxiety and depressed mood: Secondary | ICD-10-CM

## 2015-11-10 DIAGNOSIS — G060 Intracranial abscess and granuloma: Secondary | ICD-10-CM | POA: Diagnosis present

## 2015-11-10 DIAGNOSIS — K047 Periapical abscess without sinus: Secondary | ICD-10-CM

## 2015-11-10 NOTE — Assessment & Plan Note (Signed)
I am going to recheck the abscess with a CT scan to see if any residual abscess left.  If ok, will stop after the 6 weeks.  If any residual concerns, will extend for 2 more weeks.

## 2015-11-10 NOTE — Assessment & Plan Note (Signed)
Removed.  No new issues.

## 2015-11-10 NOTE — Progress Notes (Signed)
   Subjective:    Patient ID: [name], female    DOB: 1966-04-04, 50 y.o.   MRN: [id number]  HPI Here for follow up of brain abscess.     Hospitalized in [place] and MRI noted abscess, underwent stereotactic aspiration of right parietal abscess by Dr. Kathyrn Sheriff and grew Strep viridans.  I narrowed her to ceftriaxone and flagyl which she has been on about 4 weeks.  Also had tooth extraction by Dr. Enrique Sack which was the likely source.  Initial order is for 6 weeks.  She did get rehab and continues to follow Dr. Tessa Lerner.  Has some residual left-sided tingling, some anxiety about it improving and hesitancy with walking and is staying with her sister.  No fever, no chills.  Some loose stools.  Picc line working well.  Weekly labs noted and mildly low WBC at 3.4Creat 0.63 on 10/31/15.     Review of Systems  Constitutional: Negative for fever.  Gastrointestinal: Negative for nausea and diarrhea.  Skin: Negative for rash.  Neurological: Positive for light-headedness.       Objective:   Physical Exam  Constitutional: She appears well-developed and well-nourished. No distress.  Eyes: No scleral icterus.  Cardiovascular: Normal rate, regular rhythm and normal heart sounds.   No murmur heard. Musculoskeletal:  Right picc line without erythema, no discharge  Skin: No rash noted.   Social History   Social History  . Marital Status: Divorced    Spouse Name: N/A  . Number of Children: N/A  . Years of Education: N/A   Occupational History  . Not on file.   Social History Main Topics  . Smoking status: Never Smoker   . Smokeless tobacco: Never Used  . Alcohol Use: No  . Drug Use: No  . Sexual Activity: Not Currently   Other Topics Concern  . Not on file   Social History Narrative         Assessment & Plan:

## 2015-11-10 NOTE — Assessment & Plan Note (Signed)
Stable despite abscess.  Some anxiety with situation but adjusting well.

## 2015-11-15 ENCOUNTER — Telehealth: Payer: Self-pay | Admitting: *Deleted

## 2015-11-15 NOTE — Telephone Encounter (Signed)
Out seeing [name] still getting IV antibiotics.  Pt reporting stool very dark brown almost black, no sign of obvious blood but they needed to report to someone and we sent the home care out after discharge.  She has an appt with you 11/21/15 --next Monday.

## 2015-11-16 NOTE — Telephone Encounter (Signed)
They actually got one yesterday as part of the infections disease orders and H&H looked good. Results should be sent to [place].

## 2015-11-16 NOTE — Telephone Encounter (Signed)
i would check a cbc just to be safe. thanks

## 2015-11-16 NOTE — Telephone Encounter (Signed)
Notified Sharyon Cable RN [facility].

## 2015-11-18 ENCOUNTER — Telehealth: Payer: Self-pay | Admitting: *Deleted

## 2015-11-18 NOTE — Telephone Encounter (Signed)
Patient's CT is approved [id number], valid 2/27 - 3/29. RN rescheduled from 4/3 to 3/9, as patient needs this completed to evaluate the stop date for IV antibiotics (currently 3/10 per [facility], [place]) and within the authorized period.  RN left message for patient with the information.  [facility] aware.  Will fax most recent labs to [place] at [phone], attention [place]. [facility] will be notified by Dr. Linus Salmons regarding any changes to the stop date, any orders for PICC. Landis Gandy, RN

## 2015-11-21 ENCOUNTER — Encounter: Payer: Medicaid Other | Admitting: Physical Medicine & Rehabilitation

## 2015-11-22 ENCOUNTER — Telehealth: Payer: Self-pay | Admitting: *Deleted

## 2015-11-22 ENCOUNTER — Telehealth: Payer: Self-pay

## 2015-11-22 NOTE — Telephone Encounter (Signed)
Right scapular pain "stabbing" "9/10."  Breathing increases pain on right side and the pain moves to the front of his chest.  Breath sounds slightly decreased on the left side.  Pt's IV antibiotics continuing until Head CT on 11/24/15 is completed and reviewed. [facility] has stop date of 11/25/15.  MD please advise of changes to plan of care.

## 2015-11-22 NOTE — Telephone Encounter (Signed)
This message has been taken care of by [facility] .

## 2015-11-22 NOTE — Telephone Encounter (Signed)
[facility] Triage Nurse Pool     Phone Number: [phone]            Langley Gauss called to clarify stop date for Rhondi Nold. We had pt's stop date 11/25/15. RX is written for stop date 11/18/15. Pt has meds in the home until 11/25/15. Do you want pt to continue meds in the home or go ahead and stop? Thank you.    Per Dr. Linus Salmons, continue until at least 3/10. CT is to be done 3/9, he will review the results and give orders to continue IV antibiotics or to stop and pull PICC. Landis Gandy, RN

## 2015-11-22 NOTE — Telephone Encounter (Signed)
[facility] calling for information of stop dates for [employer].  They have two stop dates  November 18, 2015 and November 25, 2015.  Please clarify stop date.  [id number] Vinnie Level

## 2015-11-22 NOTE — Telephone Encounter (Signed)
No changes for that.  She may need to see her PCP to evaluate that.  It would not be related to her treatment. thanks

## 2015-11-24 ENCOUNTER — Ambulatory Visit
Admission: RE | Admit: 2015-11-24 | Discharge: 2015-11-24 | Disposition: A | Discharge status: Home / Self Care | Payer: Medicaid Other | Source: Ambulatory Visit | Attending: Internal Medicine | Admitting: Internal Medicine

## 2015-11-24 DIAGNOSIS — G060 Intracranial abscess and granuloma: Secondary | ICD-10-CM

## 2015-11-24 MED ORDER — IOPAMIDOL (ISOVUE-300) INJECTION 61%
75.0000 mL | Freq: Once | INTRAVENOUS | Status: AC | PRN
  Administered 2015-11-24: 75 mL via INTRAVENOUS

## 2015-11-24 NOTE — Telephone Encounter (Signed)
Shared Dr Henreitta Leber message.

## 2015-11-25 ENCOUNTER — Telehealth: Payer: Self-pay | Admitting: *Deleted

## 2015-11-25 ENCOUNTER — Other Ambulatory Visit: Payer: Self-pay | Admitting: Internal Medicine

## 2015-11-25 MED ORDER — CEPHALEXIN 500 MG PO CAPS: 500.0000 mg | ORAL_CAPSULE | Freq: Four times a day (QID) | ORAL | Status: DC

## 2015-11-25 NOTE — Telephone Encounter (Signed)
-----   Message from Thayer Headings, MD sent at 11/25/2015  9:01 AM EST ----- Please let her know the CT looks great, no residual abscess.  I did send a prescription for an oral antibiotic for another month to be sure but will stop the IV.  She can also stop the flagyl and just take the keflex I sent already today.  Please let [facility] know to stop antibiotics and pull picc. thanks

## 2015-11-25 NOTE — Telephone Encounter (Signed)
Called the patient and advised her per note from Dr Linus Salmons and to pick the oral antibiotic up asap also to keep her follow up appt in April. Also that [facility] will be contacting her to D/C the PICC.

## 2015-11-25 NOTE — Telephone Encounter (Signed)
Called [facility] and gave stop date for PICC 11/25/15 per note from [place].

## 2015-12-01 ENCOUNTER — Encounter: Payer: Self-pay | Admitting: Internal Medicine

## 2015-12-02 ENCOUNTER — Telehealth: Payer: Self-pay | Admitting: Physical Medicine & Rehabilitation

## 2015-12-02 NOTE — Telephone Encounter (Signed)
Please advise 

## 2015-12-02 NOTE — Telephone Encounter (Signed)
Patient is out of her oxycodone and needs a refill.  She has an appointment with Dr. Naaman Plummer on 3/21.

## 2015-12-05 NOTE — Telephone Encounter (Signed)
Left message refill at appointment.

## 2015-12-05 NOTE — Telephone Encounter (Signed)
i could fill today, but at this point, if she's coming tomorrow, should probably just wait until her appt

## 2015-12-06 ENCOUNTER — Encounter: Payer: Self-pay | Admitting: Physical Medicine & Rehabilitation

## 2015-12-06 ENCOUNTER — Encounter: Payer: Self-pay | Admitting: Internal Medicine

## 2015-12-06 ENCOUNTER — Encounter: Payer: Medicaid Other | Attending: Physical Medicine & Rehabilitation | Admitting: Physical Medicine & Rehabilitation

## 2015-12-06 VITALS — BP 156/74 | HR 80

## 2015-12-06 DIAGNOSIS — Z5181 Encounter for therapeutic drug level monitoring: Secondary | ICD-10-CM | POA: Diagnosis not present

## 2015-12-06 DIAGNOSIS — J45909 Unspecified asthma, uncomplicated: Secondary | ICD-10-CM | POA: Diagnosis not present

## 2015-12-06 DIAGNOSIS — R51 Headache: Secondary | ICD-10-CM | POA: Diagnosis not present

## 2015-12-06 DIAGNOSIS — S83206A Unspecified tear of unspecified meniscus, current injury, right knee, initial encounter: Secondary | ICD-10-CM | POA: Insufficient documentation

## 2015-12-06 DIAGNOSIS — Z808 Family history of malignant neoplasm of other organs or systems: Secondary | ICD-10-CM | POA: Insufficient documentation

## 2015-12-06 DIAGNOSIS — G060 Intracranial abscess and granuloma: Secondary | ICD-10-CM | POA: Insufficient documentation

## 2015-12-06 DIAGNOSIS — Z79899 Other long term (current) drug therapy: Secondary | ICD-10-CM

## 2015-12-06 DIAGNOSIS — R42 Dizziness and giddiness: Secondary | ICD-10-CM | POA: Diagnosis not present

## 2015-12-06 DIAGNOSIS — Z833 Family history of diabetes mellitus: Secondary | ICD-10-CM | POA: Insufficient documentation

## 2015-12-06 DIAGNOSIS — Z811 Family history of alcohol abuse and dependence: Secondary | ICD-10-CM | POA: Insufficient documentation

## 2015-12-06 DIAGNOSIS — R410 Disorientation, unspecified: Secondary | ICD-10-CM | POA: Diagnosis not present

## 2015-12-06 DIAGNOSIS — M069 Rheumatoid arthritis, unspecified: Secondary | ICD-10-CM | POA: Diagnosis not present

## 2015-12-06 DIAGNOSIS — G8929 Other chronic pain: Secondary | ICD-10-CM | POA: Diagnosis present

## 2015-12-06 DIAGNOSIS — G8194 Hemiplegia, unspecified affecting left nondominant side: Secondary | ICD-10-CM | POA: Diagnosis not present

## 2015-12-06 DIAGNOSIS — R519 Headache, unspecified: Secondary | ICD-10-CM | POA: Insufficient documentation

## 2015-12-06 DIAGNOSIS — R531 Weakness: Secondary | ICD-10-CM | POA: Diagnosis not present

## 2015-12-06 DIAGNOSIS — Z800 Family history of malignant neoplasm of digestive organs: Secondary | ICD-10-CM | POA: Insufficient documentation

## 2015-12-06 MED ORDER — TOPIRAMATE 25 MG PO TABS: 25.0000 mg | ORAL_TABLET | Freq: Every day | ORAL | Status: DC

## 2015-12-06 MED ORDER — OXYCODONE-ACETAMINOPHEN 5-325 MG PO TABS: 1.0000 | ORAL_TABLET | Freq: Three times a day (TID) | ORAL | Status: DC | PRN

## 2015-12-06 NOTE — Patient Instructions (Signed)
  PLEASE CALL ME WITH ANY PROBLEMS OR QUESTIONS (#[phone]).      

## 2015-12-06 NOTE — Progress Notes (Signed)
Subjective:    Patient ID: [name], female    DOB: [birth year], 50 y.o.   MRN: [id number]  HPI   [name] is here in follow up of her brain abscess. She has followed up with ID and NS per routine.  Recent CT of her head showed improvement and no residual abcess. She is still on keflex still per ID  Her anxiety has improved. She is no longer using xanax.   She continues to have headaches, predominantly in the frontal area. She has been taking percocet which has helped, but she's been out of that for a few days. Symptoms are worst in the morning but she can have them at any time during the day. The h/a will be better if she sits down. Exertion makes the pain worse.   She is walking every day and finds this therapeutic.   Right shoulder is problematic related to an old RTC injury. She feels that she became stiff after her PICC line was in place for so long. She states that she was "due for surgery"  Prior to the abscess.   She notices occasional peripheral field issues. Her cognition is noraml. She notices occasional swelling. She walks slower to keep her balance.   [name] deals with the normal stresses of home as it pertains to her children, family issues, etc.    Pain Inventory Average Pain 8 Pain Right Now 8 My pain is dull  In the last 24 hours, has pain interfered with the following? General activity 7 Relation with others 8 Enjoyment of life 8 What TIME of day is your pain at its worst? morning Sleep (in general) Fair  Pain is worse with: bending and inactivity Pain improves with: . Relief from Meds: 7  Mobility walk without assistance how many minutes can you walk? 30 ability to climb steps?  yes do you drive?  yes Do you have any goals in this area?  no  Function employed # of hrs/week 20  Neuro/Psych dizziness confusion  Prior Studies CT/MRI  Physicians involved in your care Inf Disease, [place]   Family History  Problem Relation Age of Onset  . Cancer  Mother     colon cancer  . Cancer Father     brain cancer  . Alcohol abuse Father   . Diabetes Father    Social History   Social History  . Marital Status: Divorced    Spouse Name: N/A  . Number of Children: N/A  . Years of Education: N/A   Social History Main Topics  . Smoking status: Never Smoker   . Smokeless tobacco: Never Used  . Alcohol Use: No  . Drug Use: No  . Sexual Activity: Not Currently   Other Topics Concern  . None   Social History Narrative   Past Surgical History  Procedure Laterality Date  . Cesarean section    . Oophorectomy    . Tubal ligation    . Shoulder surgery    . Craniotomy Right 10/03/2015    Procedure: Stereotactic right frontoparietal craniotomy for excisional biopsy of lesion ;  Surgeon: Consuella Lose, MD;  Location: [place] [facility];  Service: Neurosurgery;  Laterality: Right;  . Application of cranial navigation Right 10/03/2015    Procedure: APPLICATION OF CRANIAL NAVIGATION;  Surgeon: Consuella Lose, MD;  Location: [place] [facility];  Service: Neurosurgery;  Laterality: Right;  . Tooth extraction N/A 10/07/2015    Procedure: Extraction of tooth #18 with alveoloplasty;  Surgeon: Lenn Cal, DDS;  Location: [facility];  Service: Oral Surgery;  Laterality: N/A;   Past Medical History  Diagnosis Date  . Asthma   . Rotator cuff tear   . Perimenopausal   . Slipped intervertebral disc   . Meniscus tear   . Chronic knee pain     right  . Rheumatoid arthritis (HCC)    BP 156/74 mmHg  Pulse 80  SpO2 95%  LMP 09/27/2011  Opioid Risk Score:   Fall Risk Score:  `1  Depression screen PHQ 2/9  Depression screen [name] 2/9 12/06/2015 11/10/2015  Decreased Interest 1 0  Down, Depressed, Hopeless 1 0  PHQ - 2 Score 2 0  Altered sleeping 1 -  Tired, decreased energy 1 -  Change in appetite 0 -  Feeling bad or failure about yourself  0 -  Trouble concentrating 1 -  Moving slowly or fidgety/restless 1 -  Suicidal thoughts 0 -  PHQ-9 Score  6 -  Difficult doing work/chores Somewhat difficult -     Review of Systems     Objective:   Physical Exam  Constitutional: She is oriented to person, place, and time. She appears well-developed and well-nourished.  HENT: #18 site s/p extraction. Area appears to be healing nicely Head: Normocephalic. Crani incision intact with staples Right Ear: External ear normal.  Left Ear: External ear normal.  Eyes: Conjunctivae and EOM are normal.  Neck: Normal range of motion. Neck supple. No thyromegaly present.  Cardiovascular: Normal rate and regular rhythm. No murmurs, rubs, or gallops Respiratory: Effort normal and breath sounds normal. No respiratory distress. lungs are clear GI: Soft. Bowel sounds are normal. She exhibits no distension.  Musculoskeletal:  PROM No tenderness in extremities  Neurological: She is alert and oriented to person, place, and time. Seems to have good insight and awareness, no CN abnormalities. Cognitively displays normal memory, attention, insight and awareness Follows full commands Sensation 2/2. Motor: R UE/RLE: 5/5 proximal distal L UE: 5/5 deltoid, bicep,tricep, wrist, HI.  LLE: 5/5. Normal gait, tandem gait excellent.  DTRs increased on left side  Skin: Skin is warm and dry.  Psychiatric: Her behavior is normal, pleasant,        Assessment & Plan:  1. Left-sided weakness and headache secondary to brain abscess likely secondary to dental caries status post tooth extraction  -doing extremely well. Independent with mobility and all ADL's.  2. Headaches: Percocet as needed. Goal to wean down #60 written today  -topamax 25mg  qhs 4. Mood: improved 5. Neuropsych: This patient is capable of making decisions on her own behalf. 6. ID. Keflex and follow up per ID 7.. Asthma:per primary  Fifteen minutes of face to face patient care time were spent during this visit. All questions were encouraged and answered.  Follow up in one month

## 2015-12-11 LAB — TOXASSURE SELECT,+ANTIDEPR,UR: PDF: 0

## 2015-12-12 NOTE — Progress Notes (Signed)
Urine drug screen for this encounter is consistent for prescribed medication 

## 2015-12-19 ENCOUNTER — Other Ambulatory Visit: Payer: Self-pay

## 2015-12-22 ENCOUNTER — Ambulatory Visit: Payer: Self-pay | Admitting: Internal Medicine

## 2016-01-03 ENCOUNTER — Encounter: Payer: Medicaid Other | Attending: Physical Medicine & Rehabilitation | Admitting: Physical Medicine & Rehabilitation

## 2016-01-03 DIAGNOSIS — R51 Headache: Secondary | ICD-10-CM | POA: Insufficient documentation

## 2016-01-03 DIAGNOSIS — R531 Weakness: Secondary | ICD-10-CM | POA: Insufficient documentation

## 2016-01-03 DIAGNOSIS — Z833 Family history of diabetes mellitus: Secondary | ICD-10-CM | POA: Insufficient documentation

## 2016-01-03 DIAGNOSIS — G8929 Other chronic pain: Secondary | ICD-10-CM | POA: Insufficient documentation

## 2016-01-03 DIAGNOSIS — Z800 Family history of malignant neoplasm of digestive organs: Secondary | ICD-10-CM | POA: Insufficient documentation

## 2016-01-03 DIAGNOSIS — J45909 Unspecified asthma, uncomplicated: Secondary | ICD-10-CM | POA: Insufficient documentation

## 2016-01-03 DIAGNOSIS — Z811 Family history of alcohol abuse and dependence: Secondary | ICD-10-CM | POA: Insufficient documentation

## 2016-01-03 DIAGNOSIS — R410 Disorientation, unspecified: Secondary | ICD-10-CM | POA: Insufficient documentation

## 2016-01-03 DIAGNOSIS — R42 Dizziness and giddiness: Secondary | ICD-10-CM | POA: Insufficient documentation

## 2016-01-03 DIAGNOSIS — G060 Intracranial abscess and granuloma: Secondary | ICD-10-CM | POA: Insufficient documentation

## 2016-01-03 DIAGNOSIS — S83206A Unspecified tear of unspecified meniscus, current injury, right knee, initial encounter: Secondary | ICD-10-CM | POA: Insufficient documentation

## 2016-01-03 DIAGNOSIS — Z808 Family history of malignant neoplasm of other organs or systems: Secondary | ICD-10-CM | POA: Insufficient documentation

## 2016-01-03 DIAGNOSIS — M069 Rheumatoid arthritis, unspecified: Secondary | ICD-10-CM | POA: Insufficient documentation

## 2016-01-04 ENCOUNTER — Telehealth: Payer: Self-pay | Admitting: Physical Medicine & Rehabilitation

## 2016-01-04 NOTE — Telephone Encounter (Signed)
Patient called letting us know she just got in from [place] and needs a refill on her oxycodone.  I left her a voicemail stating that she would need to call our office to get an appointment to get her medication.

## 2016-01-10 ENCOUNTER — Encounter (HOSPITAL_BASED_OUTPATIENT_CLINIC_OR_DEPARTMENT_OTHER): Payer: Medicaid Other | Admitting: Physical Medicine & Rehabilitation

## 2016-01-10 ENCOUNTER — Encounter: Payer: Self-pay | Admitting: Physical Medicine & Rehabilitation

## 2016-01-10 VITALS — BP 138/69 | HR 75 | Resp 14

## 2016-01-10 DIAGNOSIS — G8194 Hemiplegia, unspecified affecting left nondominant side: Secondary | ICD-10-CM | POA: Diagnosis not present

## 2016-01-10 DIAGNOSIS — R51 Headache: Secondary | ICD-10-CM | POA: Diagnosis not present

## 2016-01-10 DIAGNOSIS — R42 Dizziness and giddiness: Secondary | ICD-10-CM | POA: Diagnosis not present

## 2016-01-10 DIAGNOSIS — R519 Headache, unspecified: Secondary | ICD-10-CM

## 2016-01-10 DIAGNOSIS — Z808 Family history of malignant neoplasm of other organs or systems: Secondary | ICD-10-CM | POA: Diagnosis not present

## 2016-01-10 DIAGNOSIS — S83206A Unspecified tear of unspecified meniscus, current injury, right knee, initial encounter: Secondary | ICD-10-CM | POA: Diagnosis not present

## 2016-01-10 DIAGNOSIS — M069 Rheumatoid arthritis, unspecified: Secondary | ICD-10-CM | POA: Diagnosis not present

## 2016-01-10 DIAGNOSIS — R531 Weakness: Secondary | ICD-10-CM | POA: Diagnosis not present

## 2016-01-10 DIAGNOSIS — J45909 Unspecified asthma, uncomplicated: Secondary | ICD-10-CM | POA: Diagnosis not present

## 2016-01-10 DIAGNOSIS — Z800 Family history of malignant neoplasm of digestive organs: Secondary | ICD-10-CM | POA: Diagnosis not present

## 2016-01-10 DIAGNOSIS — G060 Intracranial abscess and granuloma: Secondary | ICD-10-CM | POA: Diagnosis not present

## 2016-01-10 DIAGNOSIS — Z811 Family history of alcohol abuse and dependence: Secondary | ICD-10-CM | POA: Diagnosis not present

## 2016-01-10 DIAGNOSIS — G8929 Other chronic pain: Secondary | ICD-10-CM | POA: Diagnosis present

## 2016-01-10 DIAGNOSIS — R410 Disorientation, unspecified: Secondary | ICD-10-CM | POA: Diagnosis not present

## 2016-01-10 DIAGNOSIS — Z833 Family history of diabetes mellitus: Secondary | ICD-10-CM | POA: Diagnosis not present

## 2016-01-10 MED ORDER — OXYCODONE-ACETAMINOPHEN 5-325 MG PO TABS: 1.0000 | ORAL_TABLET | Freq: Three times a day (TID) | ORAL | Status: DC | PRN

## 2016-01-10 MED ORDER — DIVALPROEX SODIUM 250 MG PO DR TAB: 250.0000 mg | DELAYED_RELEASE_TABLET | Freq: Two times a day (BID) | ORAL | Status: DC

## 2016-01-10 NOTE — Progress Notes (Signed)
Subjective:    Patient ID: [name], female    DOB: 15-Oct-1965, 49 y.o.   MRN: [id number]  HPI  [name] is here in follow up of her brain abscess and chronic headaches. She didn't tolerate the topamax. She felt it caused some nausea. She is having some ongoing diarrhea secondary to abx. She is complaining of "blurred" vision although a recent eye exam revealed she had "20/20" vision. She feels that perhaps there's a "film" over her eyes. She sometimes feels that it is difficult to concentrate and sometimes she "stutters."   Percocet seems to help pain and doesn't make her sleepy.  Her sleep is poor due to stress and her headaches.   She is doing some work and busy with her twin boys one of which is autistic.     Pain Inventory Average Pain 7 Pain Right Now 7 My pain is dull  In the last 24 hours, has pain interfered with the following? General activity 6 Relation with others 7 Enjoyment of life 8 What TIME of day is your pain at its worst? all Sleep (in general) Fair  Pain is worse with: unsure Pain improves with: medication Relief from Meds: 8  Mobility walk without assistance how many minutes can you walk? 20-30 ability to climb steps?  yes do you drive?  yes Do you have any goals in this area?  yes  Function employed # of hrs/week 20-30  Neuro/Psych bowel control problems confusion anxiety  Prior Studies Any changes since last visit?  no  Physicians involved in your care Any changes since last visit?  no   Family History  Problem Relation Age of Onset  . Cancer Mother     colon cancer  . Cancer Father     brain cancer  . Alcohol abuse Father   . Diabetes Father    Social History   Social History  . Marital Status: Divorced    Spouse Name: N/A  . Number of Children: N/A  . Years of Education: N/A   Social History Main Topics  . Smoking status: Never Smoker   . Smokeless tobacco: Never Used  . Alcohol Use: No  . Drug Use: No  . Sexual  Activity: Not Currently   Other Topics Concern  . None   Social History Narrative   Past Surgical History  Procedure Laterality Date  . Cesarean section    . Oophorectomy    . Tubal ligation    . Shoulder surgery    . Craniotomy Right 10/03/2015    Procedure: Stereotactic right frontoparietal craniotomy for excisional biopsy of lesion ;  Surgeon: Consuella Lose, MD;  Location: [place] [facility];  Service: Neurosurgery;  Laterality: Right;  . Application of cranial navigation Right 10/03/2015    Procedure: APPLICATION OF CRANIAL NAVIGATION;  Surgeon: Consuella Lose, MD;  Location: [place] [facility];  Service: Neurosurgery;  Laterality: Right;  . Tooth extraction N/A 10/07/2015    Procedure: Extraction of tooth #18 with alveoloplasty;  Surgeon: Lenn Cal, DDS;  Location: [place] [facility];  Service: Oral Surgery;  Laterality: N/A;   Past Medical History  Diagnosis Date  . Asthma   . Rotator cuff tear   . Perimenopausal   . Slipped intervertebral disc   . Meniscus tear   . Chronic knee pain     right  . Rheumatoid arthritis (HCC)    BP 138/69 mmHg  Pulse 75  Resp 14  SpO2 97%  LMP 09/27/2011  Opioid Risk Score:  Fall Risk Score:  `1  Depression screen PHQ 2/9  Depression screen [facility] 2/9 12/06/2015 11/10/2015  Decreased Interest 1 0  Down, Depressed, Hopeless 1 0  PHQ - 2 Score 2 0  Altered sleeping 1 -  Tired, decreased energy 1 -  Change in appetite 0 -  Feeling bad or failure about yourself  0 -  Trouble concentrating 1 -  Moving slowly or fidgety/restless 1 -  Suicidal thoughts 0 -  PHQ-9 Score 6 -  Difficult doing work/chores Somewhat difficult -     Review of Systems  Gastrointestinal: Positive for diarrhea.  All other systems reviewed and are negative.      Objective:   Physical Exam  Constitutional: She is oriented to person, place, and time. She appears well-developed and well-nourished.  HENT: #18 site s/p extraction. Area appears to be healing nicely    Head: Normocephalic. Crani incision intact with staples Right Ear: External ear normal.  Left Ear: External ear normal.  Eyes: Conjunctivae and EOM are normal.  Neck: Normal range of motion. Neck supple. No thyromegaly present.  Cardiovascular: Normal rate and regular rhythm. No murmurs, rubs, or gallops  Respiratory: Effort normal and breath sounds normal. No respiratory distress. lungs are clear  GI: Soft. Bowel sounds are normal. She exhibits no distension.  Musculoskeletal:  PROM No tenderness in extremities  Neurological: She is alert and oriented to person, place, and time. i see no delays in language or processing. Attention is good.  Sensation 2/2. Motor: R UE/RLE: 5/5 proximal distal L UE: 5/5 deltoid, bicep,tricep, wrist, HI.  LLE: 5/5. Normal gait, tandem gait excellent.    Skin: Skin is warm and dry.  Psychiatric: Her behavior is normal, pleasant, a little anxious at times.   Assessment & Plan:   1. Left-sided weakness and headache secondary to brain abscess likely secondary to dental caries status post tooth extraction  -discussed realistic expectations -reviewed some time mgt/organizational strategies -she is still trying to compare herself to who she was prior to the abscess.  2. Headaches: Percocet as needed. Goal to wean down #60 written today  -dc topamax -begin trial of depakote 250mg  qhs for one week then increase to bid -melatonin trial for sleep may be an option 4. Mood: improved. Off xanax 5. Neuropsych: This patient is capable of making decisions on her own behalf.  6. ID. keflex per ID/NS 7.. Asthma:per primary    25 minutes of face to face patient care time were spent during this visit. All questions were encouraged and answered.  Follow up in two month

## 2016-01-10 NOTE — Patient Instructions (Signed)
  PLEASE CALL ME WITH ANY PROBLEMS OR QUESTIONS (#[phone]).      

## 2016-01-16 ENCOUNTER — Encounter: Payer: Self-pay | Admitting: Internal Medicine

## 2016-02-03 ENCOUNTER — Telehealth: Payer: Self-pay | Admitting: *Deleted

## 2016-02-03 DIAGNOSIS — R519 Headache, unspecified: Secondary | ICD-10-CM

## 2016-02-03 DIAGNOSIS — R51 Headache: Principal | ICD-10-CM

## 2016-02-03 NOTE — Telephone Encounter (Signed)
Left message to call back  

## 2016-02-03 NOTE — Telephone Encounter (Signed)
This was the goal with the percocet:  2. Headaches: Percocet as needed. Goal to wean down #60 written today  -dc topamax -begin trial of depakote 250mg  qhs for one week then increase to bid  Not only has she not weaned down, she's taking more. Is she using the depakote? I will not fill these earlier than 30 days

## 2016-02-03 NOTE — Telephone Encounter (Signed)
Patient left a message claiming that she was told to call when she would be needing a refill on her percocet.  Her next appointment is 03/14/2016 with Dr. Naaman Plummer.  She states in the message that she takes 2....sometimes 3 tablets a day for her pain.  Her medication list indicates last script was written 01/10/2016.  Please advise

## 2016-02-07 MED ORDER — OXYCODONE-ACETAMINOPHEN 5-325 MG PO TABS: 1.0000 | ORAL_TABLET | Freq: Three times a day (TID) | ORAL | Status: DC | PRN

## 2016-02-07 NOTE — Telephone Encounter (Signed)
Pt returned call. She states that she has been taking extra Percocet because she can not tolerate the Depakote. She states that Percocet is the only thing right now that is helping with her chronic headaches. She is taking a Percocet every 6-7 hours. I advised the pt that the goal was to wean down from the Percocet, which was discussed at the last OV. I advised her that the last script was printed on 04/25 and we can not fill narcotics early. She is going out of town on Friday and will need some by then. Please advise.

## 2016-02-07 NOTE — Telephone Encounter (Signed)
Contacted pt again and left VM to call back

## 2016-02-07 NOTE — Telephone Encounter (Signed)
I will refill the medicine to be picked up Thursday. However, in the future, if a medication is not working for her, she is not to unilaterally increase narcotics or any medication for that matter. She should contact us to let us know if it's intolerable, and we'll make a decision on the next step.

## 2016-02-07 NOTE — Telephone Encounter (Signed)
Pt advised. Rx has been placed up front for pick up.

## 2016-03-12 IMAGING — CT CT HEAD WO/W CM
3 of 6 series · 16 of 47 positions shown, 19 images · IV contrast (APPLIED)
Comparison: Head MRI 10/03/2015 and 09/28/2015. Head CT 09/28/2015.

CLINICAL DATA: Follow-up brain abscess. Craniotomy [DATE].
Occasional headaches and blurry vision.

EXAM:
CT HEAD WITHOUT AND WITH CONTRAST
TECHNIQUE: Contiguous axial images were obtained from the base of the skull
through the vertex without and with intravenous contrast
CONTRAST:  75mL [id number] IOPAMIDOL ([id number]) INJECTION 61%

[Series 3: head w/cm · axial · 0.47mm/px · z∈[-123,+7]mm · 11 of 30 slices shown, 14 images]
[im 2/30  brain]
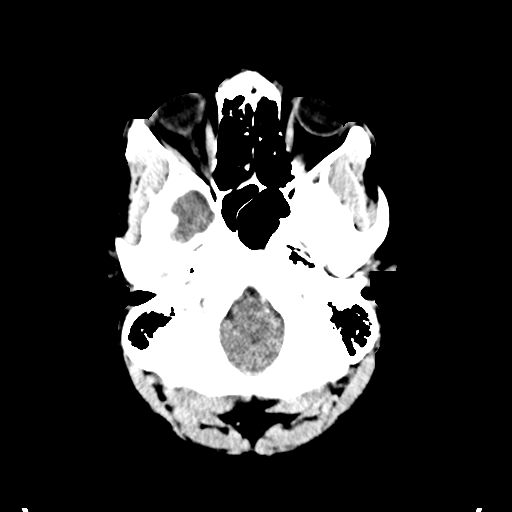
[im 2/30  bone]
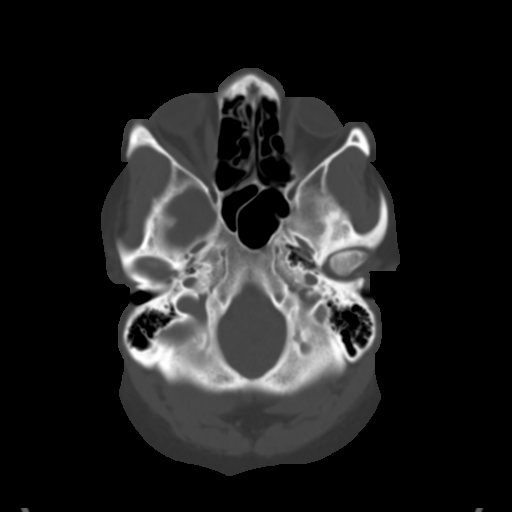
[im 4/30  brain]
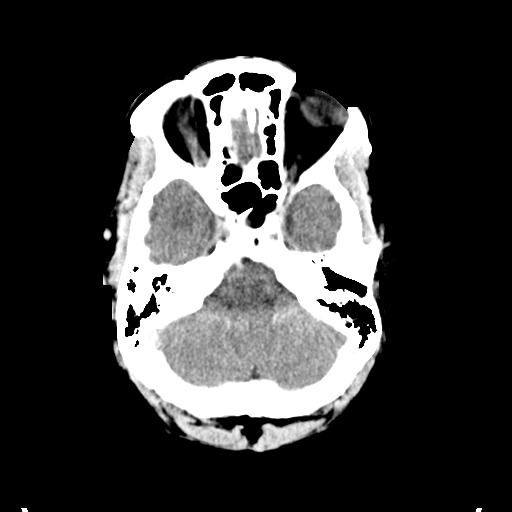
[im 8/30  brain]
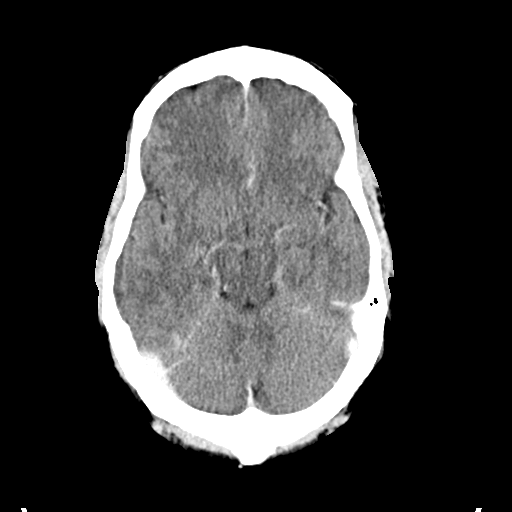
[im 10/30  brain]
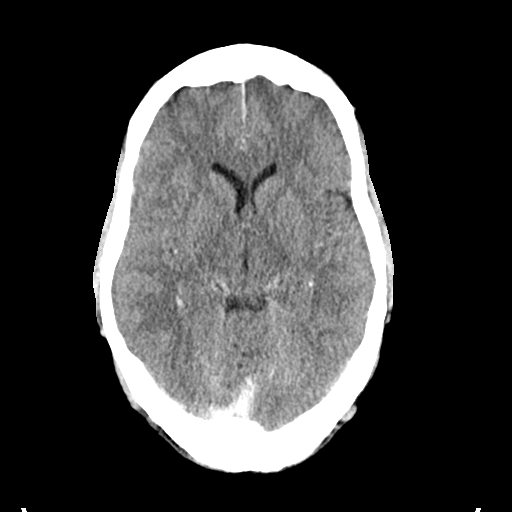
[im 12/30  brain]
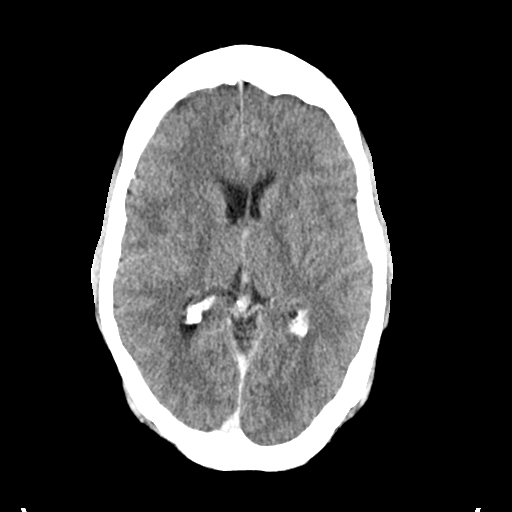
[im 12/30  bone]
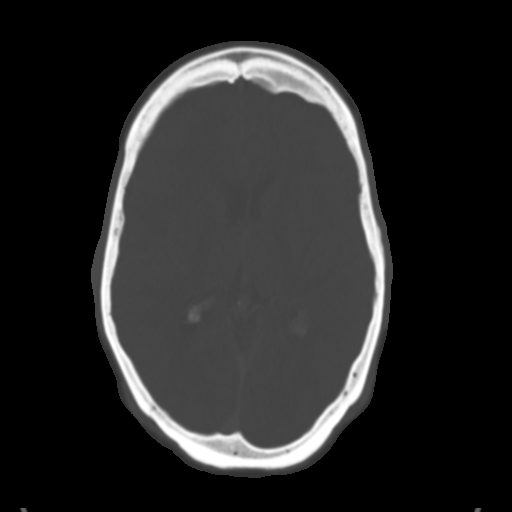
[im 16/30  brain]
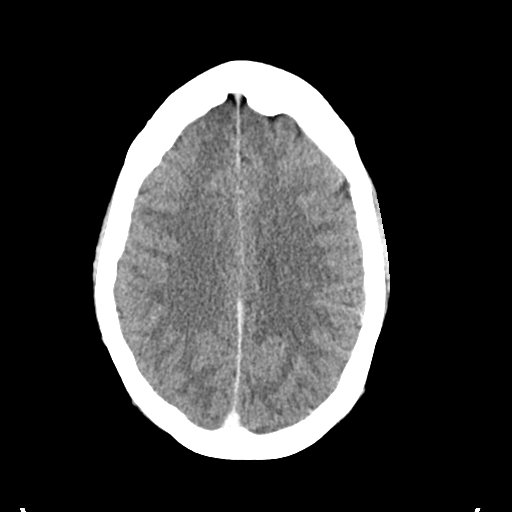
[im 18/30  brain]
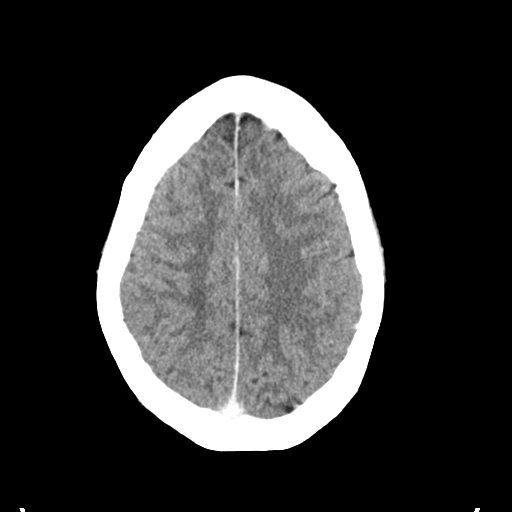
[im 20/30  brain]
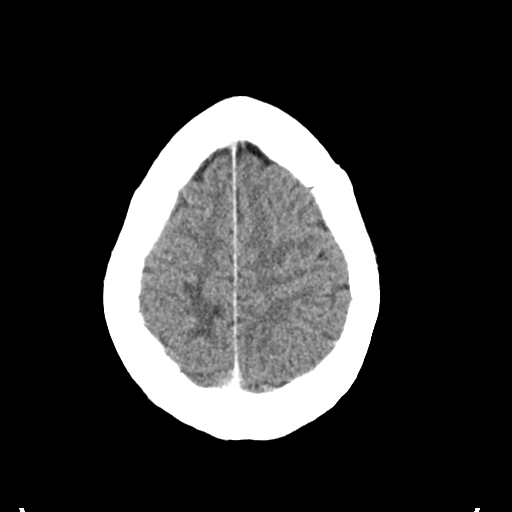
[im 22/30  brain]
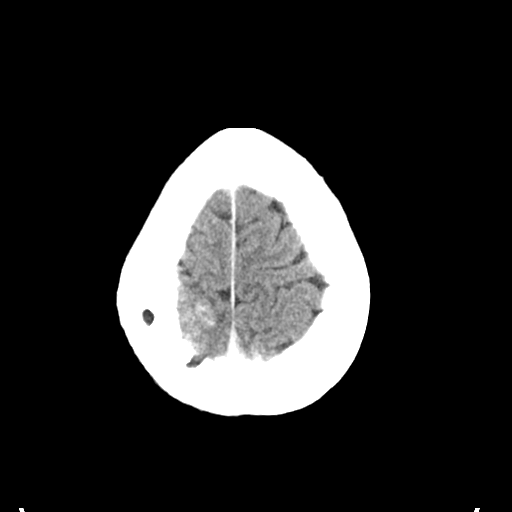
[im 22/30  bone]
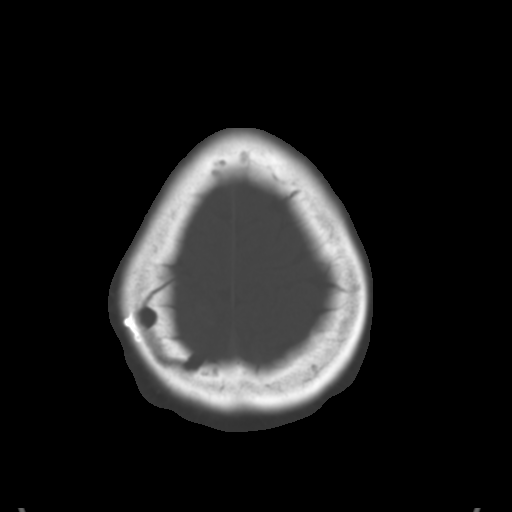
[im 26/30  brain]
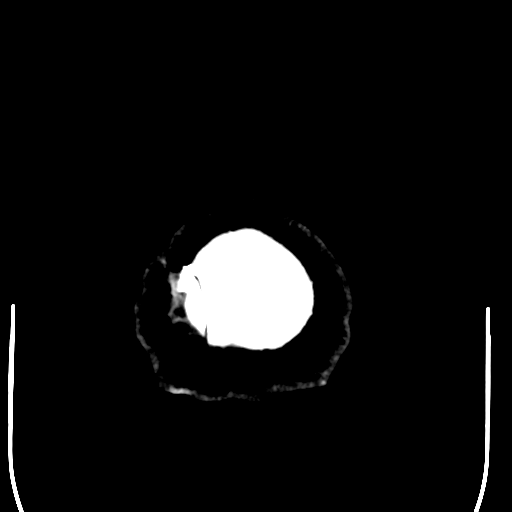
[im 28/30  brain]
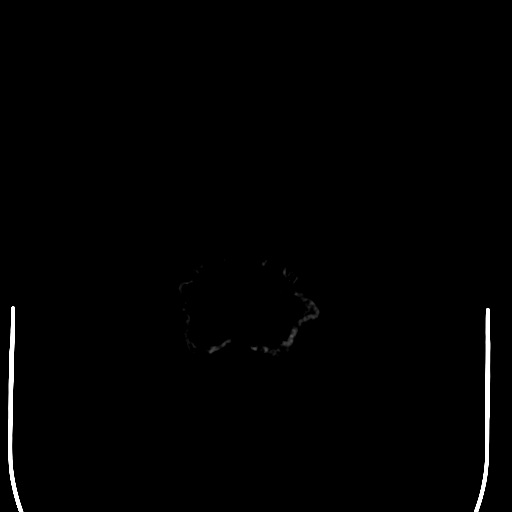

[Series 602: cor without · coronal · non-contrast · 0.33mm/px · 3 of 70 slices shown]
[im 15/70  brain]
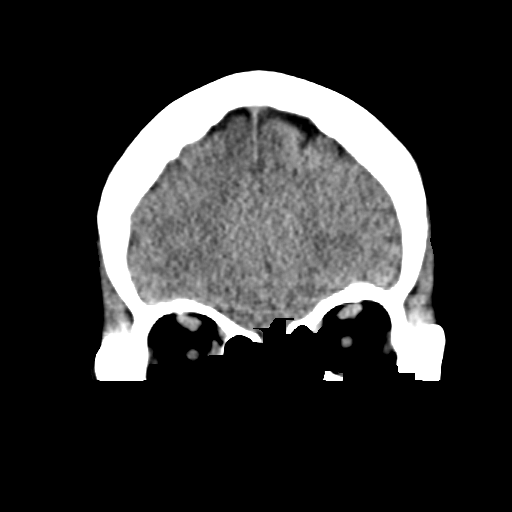
[im 28/70  brain]
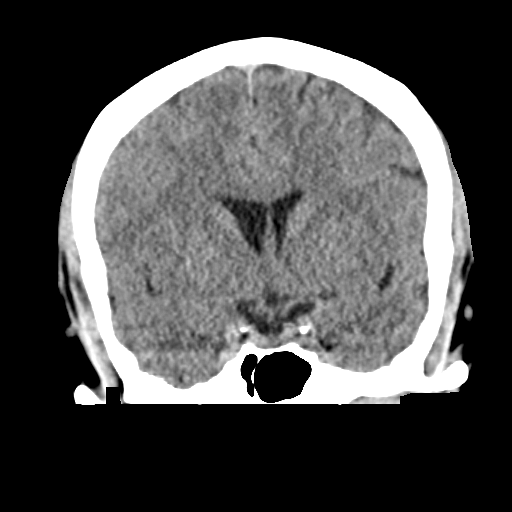
[im 42/70  brain]
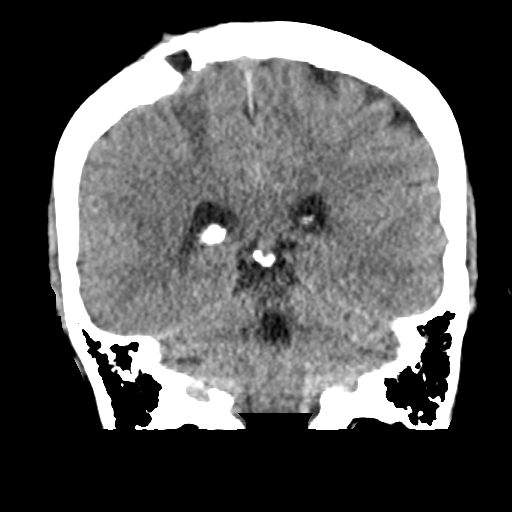

[Series 605: sag with · sagittal · 0.47mm/px · 2 of 52 slices shown]
[im 18/52  brain]
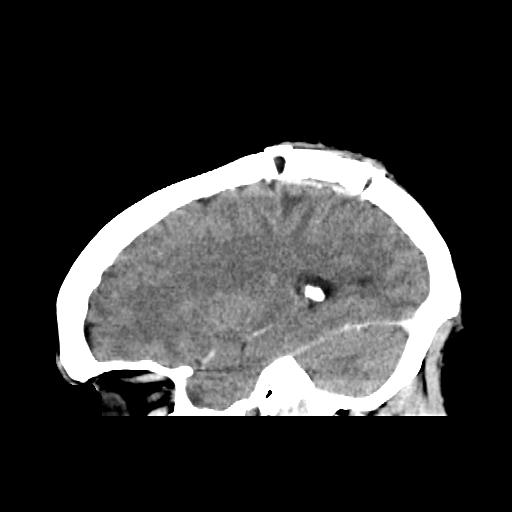
[im 35/52  brain]
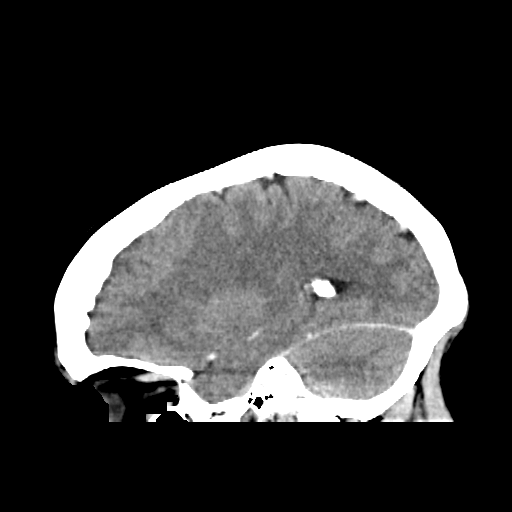

[16 of 47 positions shown; findings below may reference images not displayed]

FINDINGS: Sequelae of interval right parietal craniotomy for lesion
resection/abscess drainage are identified. There is mild residual
vasogenic edema in the right parietal lobe, less than on the
preoperative planning MRI. There is mild enhancement immediately
subjacent to the craniotomy which is partly dural and postoperative.
A small amount of gyriform enhancement is also present. No discrete
residual fluid collection is seen.

Ventricles and sulci are normal in size. No new brain lesions are
identified. There is no midline shift or sizable extra-axial fluid
collection.

Visualized orbits are unremarkable. The visualized paranasal sinuses
and mastoid air cells are clear.
IMPRESSION: Interval postoperative changes from right parietal craniotomy.
Decreased, mild residual edema with mild enhancement at the
operative site. No residual abscess identified by CT.

## 2016-03-14 ENCOUNTER — Encounter: Payer: Medicaid Other | Attending: Physical Medicine & Rehabilitation | Admitting: Physical Medicine & Rehabilitation

## 2016-03-14 ENCOUNTER — Encounter: Payer: Self-pay | Admitting: Physical Medicine & Rehabilitation

## 2016-03-14 VITALS — BP 134/84 | HR 71

## 2016-03-14 DIAGNOSIS — G060 Intracranial abscess and granuloma: Secondary | ICD-10-CM

## 2016-03-14 DIAGNOSIS — Z833 Family history of diabetes mellitus: Secondary | ICD-10-CM | POA: Diagnosis not present

## 2016-03-14 DIAGNOSIS — Z808 Family history of malignant neoplasm of other organs or systems: Secondary | ICD-10-CM | POA: Insufficient documentation

## 2016-03-14 DIAGNOSIS — R42 Dizziness and giddiness: Secondary | ICD-10-CM | POA: Diagnosis not present

## 2016-03-14 DIAGNOSIS — R519 Headache, unspecified: Secondary | ICD-10-CM

## 2016-03-14 DIAGNOSIS — M069 Rheumatoid arthritis, unspecified: Secondary | ICD-10-CM | POA: Diagnosis not present

## 2016-03-14 DIAGNOSIS — G8929 Other chronic pain: Secondary | ICD-10-CM | POA: Diagnosis present

## 2016-03-14 DIAGNOSIS — S83206A Unspecified tear of unspecified meniscus, current injury, right knee, initial encounter: Secondary | ICD-10-CM | POA: Diagnosis not present

## 2016-03-14 DIAGNOSIS — Z811 Family history of alcohol abuse and dependence: Secondary | ICD-10-CM | POA: Diagnosis not present

## 2016-03-14 DIAGNOSIS — R51 Headache: Secondary | ICD-10-CM | POA: Insufficient documentation

## 2016-03-14 DIAGNOSIS — R531 Weakness: Secondary | ICD-10-CM | POA: Insufficient documentation

## 2016-03-14 DIAGNOSIS — R410 Disorientation, unspecified: Secondary | ICD-10-CM | POA: Diagnosis not present

## 2016-03-14 DIAGNOSIS — Z800 Family history of malignant neoplasm of digestive organs: Secondary | ICD-10-CM | POA: Insufficient documentation

## 2016-03-14 DIAGNOSIS — G8194 Hemiplegia, unspecified affecting left nondominant side: Secondary | ICD-10-CM | POA: Diagnosis not present

## 2016-03-14 DIAGNOSIS — J45909 Unspecified asthma, uncomplicated: Secondary | ICD-10-CM | POA: Diagnosis not present

## 2016-03-14 MED ORDER — OXYCODONE-ACETAMINOPHEN 5-325 MG PO TABS: 1.0000 | ORAL_TABLET | Freq: Three times a day (TID) | ORAL | Status: DC | PRN

## 2016-03-14 MED ORDER — DIVALPROEX SODIUM 500 MG PO DR TAB: 500.0000 mg | DELAYED_RELEASE_TABLET | Freq: Two times a day (BID) | ORAL | Status: DC

## 2016-03-14 NOTE — Progress Notes (Signed)
Subjective:    Patient ID: [name], female    DOB: [birth year], 50 y.o.   MRN: [id number]  HPI   [name] is here in follow up of her brain abscess and associated deficits. She is exercising more and losing weight. She's eating better. The VPA was tolerated but didn't work. She is using a little less percocet as a whole.   She tried melatonin for sleep which helps a bit.   She recently had her eyes checked and she's stable. She uses glasses for reading.   [name] remains very active with her children, particularly her autistic son. She spends a lot of time working with them, exercising, etc.   Pain Inventory Average Pain 4 Pain Right Now 7 My pain is dull  In the last 24 hours, has pain interfered with the following? General activity 4 Relation with others 7 Enjoyment of life 5 What TIME of day is your pain at its worst? morning Sleep (in general) Poor  Pain is worse with: none Pain improves with: medication Relief from Meds: 8  Mobility ability to climb steps?  yes do you drive?  yes Do you have any goals in this area?  no  Function employed # of hrs/week 30 - 35 Do you have any goals in this area?  yes  Neuro/Psych dizziness confusion  Prior Studies Any changes since last visit?  no  Physicians involved in your care Any changes since last visit?  no   Family History  Problem Relation Age of Onset  . Cancer Mother     colon cancer  . Cancer Father     brain cancer  . Alcohol abuse Father   . Diabetes Father    Social History   Social History  . Marital Status: Divorced    Spouse Name: N/A  . Number of Children: N/A  . Years of Education: N/A   Social History Main Topics  . Smoking status: Never Smoker   . Smokeless tobacco: Never Used  . Alcohol Use: No  . Drug Use: No  . Sexual Activity: Not Currently   Other Topics Concern  . None   Social History Narrative   Past Surgical History  Procedure Laterality Date  . Cesarean section    .  Oophorectomy    . Tubal ligation    . Shoulder surgery    . Craniotomy Right 10/03/2015    Procedure: Stereotactic right frontoparietal craniotomy for excisional biopsy of lesion ;  Surgeon: Consuella Lose, MD;  Location: [facility];  Service: Neurosurgery;  Laterality: Right;  . Application of cranial navigation Right 10/03/2015    Procedure: APPLICATION OF CRANIAL NAVIGATION;  Surgeon: Consuella Lose, MD;  Location: [facility];  Service: Neurosurgery;  Laterality: Right;  . Tooth extraction N/A 10/07/2015    Procedure: Extraction of tooth #18 with alveoloplasty;  Surgeon: Lenn Cal, DDS;  Location: [facility];  Service: Oral Surgery;  Laterality: N/A;   Past Medical History  Diagnosis Date  . Asthma   . Rotator cuff tear   . Perimenopausal   . Slipped intervertebral disc   . Meniscus tear   . Chronic knee pain     right  . Rheumatoid arthritis ([place])    LMP 09/27/2011  Opioid Risk Score:   Fall Risk Score:  `1  Depression screen PHQ 2/9  Depression screen [facility] 2/9 12/06/2015 11/10/2015  Decreased Interest 1 0  Down, Depressed, Hopeless 1 0  PHQ - 2 Score 2 0  Altered sleeping 1 -  Tired, decreased energy 1 -  Change in appetite 0 -  Feeling bad or failure about yourself  0 -  Trouble concentrating 1 -  Moving slowly or fidgety/restless 1 -  Suicidal thoughts 0 -  PHQ-9 Score 6 -  Difficult doing work/chores Somewhat difficult -     Review of Systems     Objective:   Physical Exam BP 134/84 mmHg  Pulse 71  SpO2 97%  LMP 09/27/2011  Constitutional: She is oriented to person, place, and time. She appears well-developed and well-nourished.  HENT: #18 site s/p extraction. Area appears to be healing nicely  Head: Normocephalic. Crani incision intact with staples Right Ear: External ear normal.  Left Ear: External ear normal.  Eyes: Conjunctivae and EOM are normal.  Neck: Normal range of motion. Neck supple. No thyromegaly present.  Cardiovascular: Normal  rate and regular rhythm. No murmurs, rubs, or gallops  Respiratory: Effort normal and breath sounds normal. No respiratory distress. lungs are clear  GI: Soft. Bowel sounds are normal. She exhibits no distension.  Musculoskeletal:  PROM No tenderness in extremities  Neurological: She is alert and oriented to person, place, and time. i see no delays in language or processing. Attention is good.  Sensation 2/2. Motor: R UE/RLE: 5/5 proximal distal L UE: 5/5 deltoid, bicep,tricep, wrist, HI.  LLE: 5/5. Normal gait, tandem gait excellent.  Skin: Skin is warm and dry.  Psychiatric: Her behavior is normal, pleasant   Assessment & Plan:   1. Left-sided weakness and headache secondary to brain abscess likely secondary to dental caries status post tooth extraction  -she continues to progress.  2. Headaches: Percocet as needed. Continue to wean as possible. #45.  -increase depakote to 500mg  bid -melatonin trial for sleep may be an option  4. Mood: improved. Off xanax  5. Neuropsych: This patient is capable of making decisions on her own behalf.  6. ID. keflex per ID/NS  7.. Asthma:per primary  25 minutes of face to face patient care time were spent during this visit. All questions were encouraged and answered.  Follow up in two months

## 2016-03-14 NOTE — Patient Instructions (Signed)
PLEASE CALL ME WITH ANY PROBLEMS OR QUESTIONS ([phone])  

## 2016-04-24 ENCOUNTER — Telehealth: Payer: Self-pay | Admitting: Physical Medicine & Rehabilitation

## 2016-04-24 DIAGNOSIS — R51 Headache: Principal | ICD-10-CM

## 2016-04-24 DIAGNOSIS — R519 Headache, unspecified: Secondary | ICD-10-CM

## 2016-04-24 MED ORDER — OXYCODONE-ACETAMINOPHEN 5-325 MG PO TABS: 1.0000 | ORAL_TABLET | Freq: Three times a day (TID) | ORAL | 0 refills | Status: DC | PRN

## 2016-04-24 NOTE — Telephone Encounter (Signed)
Left message that rx will be available tomorrow. She has not signed a CSA so one will be placed with the RX to sign.

## 2016-04-24 NOTE — Telephone Encounter (Signed)
Patient is needing a refill on her Percocet.  Please call when patient when she can pick this up.

## 2016-04-24 NOTE — Telephone Encounter (Signed)
Error

## 2016-05-14 ENCOUNTER — Encounter: Payer: Medicaid Other | Attending: Physical Medicine & Rehabilitation | Admitting: Physical Medicine & Rehabilitation

## 2016-05-14 ENCOUNTER — Encounter: Payer: Medicaid Other | Admitting: Physical Medicine & Rehabilitation

## 2016-05-14 ENCOUNTER — Encounter: Payer: Self-pay | Admitting: Physical Medicine & Rehabilitation

## 2016-05-14 VITALS — BP 125/83 | HR 72 | Resp 16

## 2016-05-14 DIAGNOSIS — Z5181 Encounter for therapeutic drug level monitoring: Secondary | ICD-10-CM | POA: Diagnosis not present

## 2016-05-14 DIAGNOSIS — Z833 Family history of diabetes mellitus: Secondary | ICD-10-CM | POA: Diagnosis not present

## 2016-05-14 DIAGNOSIS — R410 Disorientation, unspecified: Secondary | ICD-10-CM | POA: Diagnosis not present

## 2016-05-14 DIAGNOSIS — Z808 Family history of malignant neoplasm of other organs or systems: Secondary | ICD-10-CM | POA: Diagnosis not present

## 2016-05-14 DIAGNOSIS — Z811 Family history of alcohol abuse and dependence: Secondary | ICD-10-CM | POA: Diagnosis not present

## 2016-05-14 DIAGNOSIS — Z79899 Other long term (current) drug therapy: Secondary | ICD-10-CM | POA: Diagnosis not present

## 2016-05-14 DIAGNOSIS — Z800 Family history of malignant neoplasm of digestive organs: Secondary | ICD-10-CM | POA: Diagnosis not present

## 2016-05-14 DIAGNOSIS — R531 Weakness: Secondary | ICD-10-CM | POA: Insufficient documentation

## 2016-05-14 DIAGNOSIS — R51 Headache: Secondary | ICD-10-CM | POA: Insufficient documentation

## 2016-05-14 DIAGNOSIS — G8929 Other chronic pain: Secondary | ICD-10-CM | POA: Insufficient documentation

## 2016-05-14 DIAGNOSIS — R42 Dizziness and giddiness: Secondary | ICD-10-CM | POA: Diagnosis not present

## 2016-05-14 DIAGNOSIS — G060 Intracranial abscess and granuloma: Secondary | ICD-10-CM | POA: Insufficient documentation

## 2016-05-14 DIAGNOSIS — G8194 Hemiplegia, unspecified affecting left nondominant side: Secondary | ICD-10-CM

## 2016-05-14 DIAGNOSIS — F4323 Adjustment disorder with mixed anxiety and depressed mood: Secondary | ICD-10-CM

## 2016-05-14 DIAGNOSIS — S83206A Unspecified tear of unspecified meniscus, current injury, right knee, initial encounter: Secondary | ICD-10-CM | POA: Diagnosis not present

## 2016-05-14 DIAGNOSIS — M069 Rheumatoid arthritis, unspecified: Secondary | ICD-10-CM | POA: Insufficient documentation

## 2016-05-14 DIAGNOSIS — J45909 Unspecified asthma, uncomplicated: Secondary | ICD-10-CM | POA: Insufficient documentation

## 2016-05-14 DIAGNOSIS — R519 Headache, unspecified: Secondary | ICD-10-CM

## 2016-05-14 MED ORDER — DULOXETINE HCL 20 MG PO CPEP: 20.0000 mg | ORAL_CAPSULE | Freq: Every day | ORAL | 2 refills | Status: DC

## 2016-05-14 MED ORDER — OXYCODONE-ACETAMINOPHEN 5-325 MG PO TABS: 1.0000 | ORAL_TABLET | Freq: Three times a day (TID) | ORAL | 0 refills | Status: DC | PRN

## 2016-05-14 NOTE — Progress Notes (Signed)
Subjective:    Patient ID: [name], female    DOB: October 08, 1965, 50 y.o.   MRN: [id number]  HPI   [name] is here in follow up of her headaches/brain abscess. She was diagnosed with DM and is now on glucophage. She feels that she's been more run-down of late, with nausea, diarrhea.  She is having struggling to sleep at times and has felt depressed since being diagnosed with DM. She feels some "fullness" in the front of her head with associated headache at times. She was given gabapentin for headache mgt but hasn't started the medication yet  She is using oxycodone for more severe pain, but is trying to wean off.   Pain Inventory Average Pain 8 Pain Right Now 8 My pain is dull  In the last 24 hours, has pain interfered with the following? General activity 8 Relation with others 8 Enjoyment of life 9 What TIME of day is your pain at its worst? morning Sleep (in general) Poor  Pain is worse with: inactivity Pain improves with: medication Relief from Meds: 8  Mobility walk without assistance how many minutes can you walk? 30-45 ability to climb steps?  yes do you drive?  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  yes  Neuro/Psych bladder control problems dizziness confusion depression  Prior Studies Any changes since last visit?  no  Physicians involved in your care Any changes since last visit?  no   Family History  Problem Relation Age of Onset  . Cancer Mother     colon cancer  . Cancer Father     brain cancer  . Alcohol abuse Father   . Diabetes Father    Social History   Social History  . Marital status: Divorced    Spouse name: N/A  . Number of children: N/A  . Years of education: N/A   Social History Main Topics  . Smoking status: Never Smoker  . Smokeless tobacco: Never Used  . Alcohol use No  . Drug use: No  . Sexual activity: Not Currently   Other Topics Concern  . None    Social History Narrative  . None   Past Surgical History:  Procedure Laterality Date  . APPLICATION OF CRANIAL NAVIGATION Right 10/03/2015   Procedure: APPLICATION OF CRANIAL NAVIGATION;  Surgeon: Consuella Lose, MD;  Location: [facility];  Service: Neurosurgery;  Laterality: Right;  . CESAREAN SECTION    . CRANIOTOMY Right 10/03/2015   Procedure: Stereotactic right frontoparietal craniotomy for excisional biopsy of lesion ;  Surgeon: Consuella Lose, MD;  Location: [place] [facility];  Service: Neurosurgery;  Laterality: Right;  . OOPHORECTOMY    . SHOULDER SURGERY    . TOOTH EXTRACTION N/A 10/07/2015   Procedure: Extraction of tooth #18 with alveoloplasty;  Surgeon: Lenn Cal, DDS;  Location: [facility];  Service: Oral Surgery;  Laterality: N/A;  . TUBAL LIGATION     Past Medical History:  Diagnosis Date  . Asthma   . Chronic knee pain    right  . Meniscus tear   . Perimenopausal   . Rheumatoid arthritis ([place])   . Rotator cuff tear   . Slipped intervertebral disc    BP 125/83 (BP Location: Left Arm, Patient Position: Sitting, Cuff Size: Large)   Pulse 72   Resp 16   LMP 09/27/2011   SpO2 97%   Opioid Risk Score:   Fall Risk  Score:  `1  Depression screen PHQ 2/9  Depression screen [facility] 2/9 05/14/2016 12/06/2015 11/10/2015  Decreased Interest 1 1 0  Down, Depressed, Hopeless 1 1 0  PHQ - 2 Score 2 2 0  Altered sleeping - 1 -  Tired, decreased energy - 1 -  Change in appetite - 0 -  Feeling bad or failure about yourself  - 0 -  Trouble concentrating - 1 -  Moving slowly or fidgety/restless - 1 -  Suicidal thoughts - 0 -  PHQ-9 Score - 6 -  Difficult doing work/chores - Somewhat difficult -    Review of Systems  Constitutional: Positive for unexpected weight change.  Endocrine:       Type II diabetes dx new  All other systems reviewed and are negative.      Objective:   Physical Exam  Constitutional: She is oriented to person, place, and time. She  appears well-developed and well-nourished.  HENT: #18 site s/p extraction. Area appears to be healing nicely  Head: Normocephalic. Crani incision intact with staples Right Ear: External ear normal.  Left Ear: External ear normal.  Eyes: Conjunctivae and EOM are normal.  Neck: Normal range of motion. Neck supple. No thyromegaly present.  Cardiovascular: Normal rate and regular rhythm. No murmurs, rubs, or gallops  Respiratory: Effort normal and breath sounds normal. No respiratory distress. lungs are clear  GI: Soft. Bowel sounds are normal. She exhibits no distension.  Musculoskeletal:  PROM  Neurological: She is alert and oriented to person, place, and time. i see no delays in language or processing. Attention is good.  Sensation 2/2. Motor: R UE/RLE: 5/5 proximal distal L UE: 5/5 deltoid, bicep,tricep, wrist, HI.  LLE: 5/5. Normal gait, tandem gait now normal also.  Skin: Skin is warm and dry.  Psychiatric: Her behavior is normal, pleasant   Assessment & Plan:   1. Left-sided weakness and headache secondary to brain abscess likely secondary to dental caries status post tooth extraction  -she has done well -needs to work on pacing herself and taking time for herself.  2. Headaches: Percocet as needed. refilled #45.  -depakote stopped. Would hold on gabapentin for now also -melatonin trial for sleep may be an option  -trial of cymbalta for pain/reactive mood 4. Mood:  Cymbalta, needs to make time for stress relief/work on coping scares 5. Neuropsych: This patient is capable of making decisions on her own behalf.  6. ID. Follow up with Dr. Linus Salmons  7. Asthma:per primary  25 minutes of face to face patient care time were spent during this visit. All questions were encouraged and answered.  Follow up in two months

## 2016-05-14 NOTE — Patient Instructions (Signed)
PLEASE CALL ME WITH ANY PROBLEMS OR QUESTIONS ([phone])  

## 2016-05-15 ENCOUNTER — Ambulatory Visit: Payer: Self-pay | Admitting: Internal Medicine

## 2016-05-23 LAB — TOXASSURE SELECT,+ANTIDEPR,UR: PDF: 0

## 2016-05-24 ENCOUNTER — Telehealth: Payer: Self-pay

## 2016-05-24 ENCOUNTER — Ambulatory Visit: Payer: Self-pay | Admitting: Internal Medicine

## 2016-05-24 NOTE — Telephone Encounter (Signed)
Pt's UDS showed a positive level of Tramadol. However, it is not on her med list. CSA was signed 04/24/16. Please advise.

## 2016-06-01 ENCOUNTER — Telehealth: Payer: Self-pay | Admitting: Physical Medicine & Rehabilitation

## 2016-06-01 NOTE — Telephone Encounter (Signed)
Needs OV to receive any more percocet---me or [name]

## 2016-06-01 NOTE — Telephone Encounter (Signed)
Patient needs a refill on oxycodone, it also looks like she didn't set up an appointment for October to return to see Naaman Plummer.  Please call patient when this is ready to be picked up.

## 2016-06-01 NOTE — Telephone Encounter (Signed)
Oxycodone acetaminophen was filled 05/14/16 and 04/25/16 (#45) Use is escalating from past use.  Please advise She does not have return appt. Though your AVS said return in 2 months

## 2016-06-04 NOTE — Telephone Encounter (Signed)
Left a voicemail for patient to call our office and schedule an appointment with [name] or himself to get medication.

## 2016-06-21 ENCOUNTER — Encounter: Payer: Self-pay | Admitting: Registered Nurse

## 2016-06-21 ENCOUNTER — Encounter: Payer: Medicaid Other | Attending: Physical Medicine & Rehabilitation | Admitting: Registered Nurse

## 2016-06-21 VITALS — BP 135/85 | HR 97

## 2016-06-21 DIAGNOSIS — Z5181 Encounter for therapeutic drug level monitoring: Secondary | ICD-10-CM

## 2016-06-21 DIAGNOSIS — G8929 Other chronic pain: Secondary | ICD-10-CM | POA: Diagnosis present

## 2016-06-21 DIAGNOSIS — G8194 Hemiplegia, unspecified affecting left nondominant side: Secondary | ICD-10-CM

## 2016-06-21 DIAGNOSIS — R531 Weakness: Secondary | ICD-10-CM | POA: Diagnosis not present

## 2016-06-21 DIAGNOSIS — J45909 Unspecified asthma, uncomplicated: Secondary | ICD-10-CM | POA: Diagnosis not present

## 2016-06-21 DIAGNOSIS — Z833 Family history of diabetes mellitus: Secondary | ICD-10-CM | POA: Insufficient documentation

## 2016-06-21 DIAGNOSIS — R42 Dizziness and giddiness: Secondary | ICD-10-CM | POA: Insufficient documentation

## 2016-06-21 DIAGNOSIS — G060 Intracranial abscess and granuloma: Secondary | ICD-10-CM | POA: Diagnosis not present

## 2016-06-21 DIAGNOSIS — Z800 Family history of malignant neoplasm of digestive organs: Secondary | ICD-10-CM | POA: Diagnosis not present

## 2016-06-21 DIAGNOSIS — R51 Headache: Secondary | ICD-10-CM | POA: Diagnosis not present

## 2016-06-21 DIAGNOSIS — M069 Rheumatoid arthritis, unspecified: Secondary | ICD-10-CM | POA: Insufficient documentation

## 2016-06-21 DIAGNOSIS — Z79899 Other long term (current) drug therapy: Secondary | ICD-10-CM

## 2016-06-21 DIAGNOSIS — S83206A Unspecified tear of unspecified meniscus, current injury, right knee, initial encounter: Secondary | ICD-10-CM | POA: Insufficient documentation

## 2016-06-21 DIAGNOSIS — Z811 Family history of alcohol abuse and dependence: Secondary | ICD-10-CM | POA: Insufficient documentation

## 2016-06-21 DIAGNOSIS — R519 Headache, unspecified: Secondary | ICD-10-CM

## 2016-06-21 DIAGNOSIS — R410 Disorientation, unspecified: Secondary | ICD-10-CM | POA: Insufficient documentation

## 2016-06-21 DIAGNOSIS — Z808 Family history of malignant neoplasm of other organs or systems: Secondary | ICD-10-CM | POA: Insufficient documentation

## 2016-06-21 MED ORDER — OXYCODONE-ACETAMINOPHEN 5-325 MG PO TABS: 1.0000 | ORAL_TABLET | Freq: Three times a day (TID) | ORAL | 0 refills | Status: DC | PRN

## 2016-06-21 NOTE — Progress Notes (Signed)
Subjective:    Patient ID: [name], female    DOB: 1966-02-26, 50 y.o.   MRN: [id number]  HPI: [name] is a 50 year old female who returns for follow up for  Her headaches and medication refill. She states her pain is chronic dull frontal headache. She rates her pain 7. Her current exercise regime is water aerobics.  Also states since being prescribed Cymbalta she has noticed stability with her mood and energy level.    Pain Inventory Average Pain 8 Pain Right Now 7 My pain is dull  In the last 24 hours, has pain interfered with the following? General activity 6 Relation with others 6 Enjoyment of life 6 What TIME of day is your pain at its worst? morning, daytime, evening Sleep (in general) NA  Pain is worse with: some activites Pain improves with: other Relief from Meds: 8  Mobility how many minutes can you walk? n/a ability to climb steps?  yes do you drive?  yes Do you have any goals in this area?  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  yes  Neuro/Psych bladder control problems weakness dizziness  Prior Studies Any changes since last visit?  no  Physicians involved in your care Any changes since last visit?  no   Family History  Problem Relation Age of Onset  . Cancer Mother     colon cancer  . Cancer Father     brain cancer  . Alcohol abuse Father   . Diabetes Father    Social History   Social History  . Marital status: Divorced    Spouse name: N/A  . Number of children: N/A  . Years of education: N/A   Social History Main Topics  . Smoking status: Never Smoker  . Smokeless tobacco: Never Used  . Alcohol use No  . Drug use: No  . Sexual activity: Not Currently   Other Topics Concern  . Not on file   Social History Narrative  . No narrative on file   Past Surgical History:  Procedure Laterality Date  . APPLICATION OF CRANIAL NAVIGATION Right 10/03/2015   Procedure: APPLICATION OF CRANIAL NAVIGATION;  Surgeon: Teresa Lose, MD;  Location: [place];  Service: Neurosurgery;  Laterality: Right;  . CESAREAN SECTION    . CRANIOTOMY Right 10/03/2015   Procedure: Stereotactic right frontoparietal craniotomy for excisional biopsy of lesion ;  Surgeon: Teresa Lose, MD;  Location: [facility];  Service: Neurosurgery;  Laterality: Right;  . OOPHORECTOMY    . SHOULDER SURGERY    . TOOTH EXTRACTION N/A 10/07/2015   Procedure: Extraction of tooth #18 with alveoloplasty;  Surgeon: [name], DDS;  Location: [facility];  Service: Oral Surgery;  Laterality: N/A;  . TUBAL LIGATION     Past Medical History:  Diagnosis Date  . Asthma   . Chronic knee pain    right  . Meniscus tear   . Perimenopausal   . Rheumatoid arthritis ([place])   . Rotator cuff tear   . Slipped intervertebral disc    LMP 09/27/2011   Opioid Risk Score:   Fall Risk Score:  `1  Depression screen PHQ 2/9  Depression screen [facility] 2/9 05/14/2016 12/06/2015 11/10/2015  Decreased Interest 1 1 0  Down, Depressed, Hopeless 1 1 0  PHQ - 2 Score 2 2 0  Altered sleeping - 1 -  Tired, decreased energy -  1 -  Change in appetite - 0 -  Feeling bad or failure about yourself  - 0 -  Trouble concentrating - 1 -  Moving slowly or fidgety/restless - 1 -  Suicidal thoughts - 0 -  PHQ-9 Score - 6 -  Difficult doing work/chores - Somewhat difficult -     Review of Systems  Constitutional: Positive for unexpected weight change.  Gastrointestinal: Positive for nausea.  Genitourinary: Positive for difficulty urinating.  Neurological: Positive for dizziness and weakness.       Objective:   Physical Exam  Constitutional: She is oriented to person, place, and time. She appears well-developed and well-nourished.  HENT:  Head: Normocephalic and atraumatic.  Neck: Normal range of motion. Neck supple.  Cardiovascular: Normal rate and regular rhythm.   Pulmonary/Chest: Effort  normal and breath sounds normal.  Musculoskeletal:  Normal Muscle Bulk and Muscle Testing Reveals: Upper Extremities: Full ROM and Muscle Strength 5/5 Lower Extremities: Full ROM and Muscle Strength 5/5 Arises from chair with ease Narrow Based Gait  Neurological: She is alert and oriented to person, place, and time.  Skin: Skin is warm and dry.  Psychiatric: She has a normal mood and affect.  Nursing note and vitals reviewed.         Assessment & Plan:  1. 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 Headaches: Refilled: Percocet 5/325 mg one tablet every 8 hours  As needed for severe pain as needed. #45.  We will continue the opioid monitoring program, this consists of regular clinic visits, examinations, urine drug screen, pill counts as well as use of [place]  3. Mood: Continue Cymbalta   25 minutes of face to face patient care time was spent during this visit. All questions were encouraged and answered.    Follow up in two months

## 2016-06-25 ENCOUNTER — Encounter: Payer: Medicaid Other | Admitting: Physical Medicine & Rehabilitation

## 2016-06-28 ENCOUNTER — Other Ambulatory Visit: Payer: Self-pay | Admitting: Obstetrics & Gynecology

## 2016-06-29 LAB — CYTOLOGY - PAP

## 2016-07-18 ENCOUNTER — Encounter: Payer: Medicaid Other | Admitting: Registered Nurse

## 2016-07-19 ENCOUNTER — Encounter: Payer: Medicaid Other | Admitting: Registered Nurse

## 2016-07-20 ENCOUNTER — Encounter: Payer: Self-pay | Admitting: Registered Nurse

## 2016-07-20 ENCOUNTER — Encounter: Payer: Medicaid Other | Attending: Physical Medicine & Rehabilitation | Admitting: Registered Nurse

## 2016-07-20 VITALS — BP 124/85 | HR 84 | Resp 14

## 2016-07-20 DIAGNOSIS — G8194 Hemiplegia, unspecified affecting left nondominant side: Secondary | ICD-10-CM | POA: Diagnosis not present

## 2016-07-20 DIAGNOSIS — S83206A Unspecified tear of unspecified meniscus, current injury, right knee, initial encounter: Secondary | ICD-10-CM | POA: Insufficient documentation

## 2016-07-20 DIAGNOSIS — R42 Dizziness and giddiness: Secondary | ICD-10-CM | POA: Insufficient documentation

## 2016-07-20 DIAGNOSIS — G060 Intracranial abscess and granuloma: Secondary | ICD-10-CM

## 2016-07-20 DIAGNOSIS — R519 Headache, unspecified: Secondary | ICD-10-CM

## 2016-07-20 DIAGNOSIS — J45909 Unspecified asthma, uncomplicated: Secondary | ICD-10-CM | POA: Insufficient documentation

## 2016-07-20 DIAGNOSIS — R410 Disorientation, unspecified: Secondary | ICD-10-CM | POA: Insufficient documentation

## 2016-07-20 DIAGNOSIS — Z5181 Encounter for therapeutic drug level monitoring: Secondary | ICD-10-CM | POA: Diagnosis not present

## 2016-07-20 DIAGNOSIS — F4323 Adjustment disorder with mixed anxiety and depressed mood: Secondary | ICD-10-CM

## 2016-07-20 DIAGNOSIS — Z811 Family history of alcohol abuse and dependence: Secondary | ICD-10-CM | POA: Diagnosis not present

## 2016-07-20 DIAGNOSIS — M069 Rheumatoid arthritis, unspecified: Secondary | ICD-10-CM | POA: Insufficient documentation

## 2016-07-20 DIAGNOSIS — G8929 Other chronic pain: Secondary | ICD-10-CM | POA: Diagnosis present

## 2016-07-20 DIAGNOSIS — Z808 Family history of malignant neoplasm of other organs or systems: Secondary | ICD-10-CM | POA: Diagnosis not present

## 2016-07-20 DIAGNOSIS — Z833 Family history of diabetes mellitus: Secondary | ICD-10-CM | POA: Diagnosis not present

## 2016-07-20 DIAGNOSIS — Z800 Family history of malignant neoplasm of digestive organs: Secondary | ICD-10-CM | POA: Diagnosis not present

## 2016-07-20 DIAGNOSIS — R51 Headache: Secondary | ICD-10-CM | POA: Insufficient documentation

## 2016-07-20 DIAGNOSIS — Z79899 Other long term (current) drug therapy: Secondary | ICD-10-CM

## 2016-07-20 DIAGNOSIS — R531 Weakness: Secondary | ICD-10-CM | POA: Insufficient documentation

## 2016-07-20 MED ORDER — DULOXETINE HCL 20 MG PO CPEP: 20.0000 mg | ORAL_CAPSULE | Freq: Every day | ORAL | 2 refills | Status: DC

## 2016-07-20 MED ORDER — OXYCODONE-ACETAMINOPHEN 5-325 MG PO TABS: 1.0000 | ORAL_TABLET | Freq: Three times a day (TID) | ORAL | 0 refills | Status: DC | PRN

## 2016-07-20 NOTE — Progress Notes (Signed)
Subjective:    Patient ID: [name], female    DOB: 08-Sep-1966, 50 y.o.   MRN: [id number]  HPI: [name] is a 50 year old female who returns for follow up for  her headaches and medication refill. She states her pain is chronic dull frontal headache which has increased while applying for law school, work and caring for her sons one is autistic.  At times she had to use the Percocet twice a day, we will increase her tablets. She rates her pain 7. Her current exercise regime is water aerobics three times a week. Also states she has noticed significant improvement with her mood and energy level since being prescribed Cymbalta.  Pain Inventory Average Pain 7 Pain Right Now 7 My pain is dull  In the last 24 hours, has pain interfered with the following? General activity 5 Relation with others 6 Enjoyment of life 6 What TIME of day is your pain at its worst? morning Sleep (in general) Fair  Pain is worse with: other Pain improves with: n/a Relief from Meds: 7  Mobility walk without assistance how many minutes can you walk? 45 ability to climb steps?  yes Do you have any goals in this area?  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  no  Neuro/Psych bladder control problems anxiety  Prior Studies Any changes since last visit?  no  Physicians involved in your care Any changes since last visit?  no   Family History  Problem Relation Age of Onset  . Cancer Mother     colon cancer  . Cancer Father     brain cancer  . Alcohol abuse Father   . Diabetes Father    Social History   Social History  . Marital status: Divorced    Spouse name: N/A  . Number of children: N/A  . Years of education: N/A   Social History Main Topics  . Smoking status: Never Smoker  . Smokeless tobacco: Never Used  . Alcohol use No  . Drug use: No  . Sexual activity: Not Currently   Other Topics Concern  . None   Social History  Narrative  . None   Past Surgical History:  Procedure Laterality Date  . APPLICATION OF CRANIAL NAVIGATION Right 10/03/2015   Procedure: APPLICATION OF CRANIAL NAVIGATION;  Surgeon: Consuella Lose, MD;  Location: [place] [facility];  Service: Neurosurgery;  Laterality: Right;  . CESAREAN SECTION    . CRANIOTOMY Right 10/03/2015   Procedure: Stereotactic right frontoparietal craniotomy for excisional biopsy of lesion ;  Surgeon: Consuella Lose, MD;  Location: [place] [facility];  Service: Neurosurgery;  Laterality: Right;  . OOPHORECTOMY    . SHOULDER SURGERY    . TOOTH EXTRACTION N/A 10/07/2015   Procedure: Extraction of tooth #18 with alveoloplasty;  Surgeon: Lenn Cal, DDS;  Location: [place];  Service: Oral Surgery;  Laterality: N/A;  . TUBAL LIGATION     Past Medical History:  Diagnosis Date  . Asthma   . Chronic knee pain    right  . Meniscus tear   . Perimenopausal   . Rheumatoid arthritis ([place])   . Rotator cuff tear   . Slipped intervertebral disc    BP 124/85   Pulse 84   Resp 14   LMP 09/27/2011   SpO2 95%   Opioid Risk Score:   Fall Risk Score:  `1  Depression screen PHQ 2/9  Depression screen  [facility] 2/9 05/14/2016 12/06/2015 11/10/2015  Decreased Interest 1 1 0  Down, Depressed, Hopeless 1 1 0  PHQ - 2 Score 2 2 0  Altered sleeping - 1 -  Tired, decreased energy - 1 -  Change in appetite - 0 -  Feeling bad or failure about yourself  - 0 -  Trouble concentrating - 1 -  Moving slowly or fidgety/restless - 1 -  Suicidal thoughts - 0 -  PHQ-9 Score - 6 -  Difficult doing work/chores - Somewhat difficult -     Review of Systems  All other systems reviewed and are negative.      Objective:   Physical Exam  Constitutional: She is oriented to person, place, and time. She appears well-developed and well-nourished.  HENT:  Head: Normocephalic and atraumatic.  Neck: Normal range of motion. Neck supple.  Cardiovascular: Normal rate and regular rhythm.     Pulmonary/Chest: Effort normal and breath sounds normal.  Musculoskeletal:  Normal Muscle Bulk and Muscle Testing Reveals: Upper Extremities: Full ROM and Muscle Strength 5/5 Lower Extremities: Full ROM and Muscle Strength 5/5 Arises from chair with ease Narrow Based Gait  Neurological: She is alert and oriented to person, place, and time.  Skin: Skin is warm and dry.  Psychiatric: She has a normal mood and affect.  Nursing note and vitals reviewed.         Assessment & Plan:  1. 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 Headaches: Refilled: Percocet 5/325 mg one tablet every 8 hours  As needed for severe pain as needed increase . #55. We will continue the opioid monitoring program, this consists of regular clinic visits, examinations, urine drug screen, pill counts as well as use of North  3. Mood: Continue Cymbalta   25 minutes of face to face patient care time was spent during this visit. All questions were encouraged and answered.    Follow up in 1 month

## 2016-08-17 ENCOUNTER — Encounter: Payer: Self-pay | Admitting: Registered Nurse

## 2016-08-17 ENCOUNTER — Encounter: Payer: Medicaid Other | Attending: Physical Medicine & Rehabilitation | Admitting: Registered Nurse

## 2016-08-17 VITALS — BP 132/87 | HR 67

## 2016-08-17 DIAGNOSIS — R519 Headache, unspecified: Secondary | ICD-10-CM

## 2016-08-17 DIAGNOSIS — R410 Disorientation, unspecified: Secondary | ICD-10-CM | POA: Insufficient documentation

## 2016-08-17 DIAGNOSIS — Z811 Family history of alcohol abuse and dependence: Secondary | ICD-10-CM | POA: Diagnosis not present

## 2016-08-17 DIAGNOSIS — R42 Dizziness and giddiness: Secondary | ICD-10-CM | POA: Insufficient documentation

## 2016-08-17 DIAGNOSIS — G8929 Other chronic pain: Secondary | ICD-10-CM | POA: Insufficient documentation

## 2016-08-17 DIAGNOSIS — J45909 Unspecified asthma, uncomplicated: Secondary | ICD-10-CM | POA: Insufficient documentation

## 2016-08-17 DIAGNOSIS — R531 Weakness: Secondary | ICD-10-CM | POA: Diagnosis not present

## 2016-08-17 DIAGNOSIS — Z833 Family history of diabetes mellitus: Secondary | ICD-10-CM | POA: Diagnosis not present

## 2016-08-17 DIAGNOSIS — R51 Headache: Secondary | ICD-10-CM | POA: Diagnosis not present

## 2016-08-17 DIAGNOSIS — M069 Rheumatoid arthritis, unspecified: Secondary | ICD-10-CM | POA: Insufficient documentation

## 2016-08-17 DIAGNOSIS — Z808 Family history of malignant neoplasm of other organs or systems: Secondary | ICD-10-CM | POA: Diagnosis not present

## 2016-08-17 DIAGNOSIS — Z5181 Encounter for therapeutic drug level monitoring: Secondary | ICD-10-CM | POA: Diagnosis not present

## 2016-08-17 DIAGNOSIS — Z800 Family history of malignant neoplasm of digestive organs: Secondary | ICD-10-CM | POA: Insufficient documentation

## 2016-08-17 DIAGNOSIS — G8194 Hemiplegia, unspecified affecting left nondominant side: Secondary | ICD-10-CM | POA: Diagnosis not present

## 2016-08-17 DIAGNOSIS — S83206A Unspecified tear of unspecified meniscus, current injury, right knee, initial encounter: Secondary | ICD-10-CM | POA: Insufficient documentation

## 2016-08-17 DIAGNOSIS — G060 Intracranial abscess and granuloma: Secondary | ICD-10-CM | POA: Diagnosis not present

## 2016-08-17 DIAGNOSIS — Z79899 Other long term (current) drug therapy: Secondary | ICD-10-CM

## 2016-08-17 MED ORDER — METHYLPREDNISOLONE 4 MG PO TBPK: ORAL_TABLET | ORAL | 0 refills | Status: DC

## 2016-08-17 MED ORDER — METFORMIN HCL 500 MG PO TABS: ORAL_TABLET | ORAL | 0 refills | Status: DC

## 2016-08-17 MED ORDER — OXYCODONE-ACETAMINOPHEN 5-325 MG PO TABS: 1.0000 | ORAL_TABLET | Freq: Three times a day (TID) | ORAL | 0 refills | Status: DC | PRN

## 2016-08-17 NOTE — Progress Notes (Signed)
Subjective:    Patient ID: [name], female    DOB: October 23, 1965, 50 y.o.   MRN: [id number]  HPI: [name] is a 49 year old female who returns for follow up appointment for chron fell landed on her right sideic pain  and medication refill. She states her pain is located in her upper and lower back mainly right side.  Also states while she was at [employer] a few weeks ago her Autistic son had a behavioral episode in the [employer] parking lot, upon restraining him she lost her balance and fell on her right side. She was able to get up on her own. She didn't seek medical attention. She has been experiencing increase intensity of right side and lower back pain mainly on the right side. No ecchymosis noted, she has tenderness with palpation. We will increase her hydrocodone tablets at this time and will decrease next month. She verbalizes understanding.  She rates her pain 8. Her current exercise regime is walking.  Pain Inventory Average Pain 8 Pain Right Now 8 My pain is constant and dull  In the last 24 hours, has pain interfered with the following? General activity 8 Relation with others 8 Enjoyment of life 8 What TIME of day is your pain at its worst? morning evening night Sleep (in general) Poor  Pain is worse with: sitting and inactivity Pain improves with: medication Relief from Meds: 6  Mobility walk without assistance ability to climb steps?  yes do you drive?  yes Do you have any goals in this area?  yes  Function not employed: date last employed . Do you have any goals in this area?  yes  Neuro/Psych dizziness confusion  Prior Studies Any changes since last visit?  no  Physicians involved in your care Any changes since last visit?  no   Family History  Problem Relation Age of Onset  . Cancer Mother     colon cancer  . Cancer Father     brain cancer  . Alcohol abuse Father   . Diabetes Father    Social History   Social History  . Marital status:  Divorced    Spouse name: N/A  . Number of children: N/A  . Years of education: N/A   Social History Main Topics  . Smoking status: Never Smoker  . Smokeless tobacco: Never Used  . Alcohol use No  . Drug use: No  . Sexual activity: Not Currently   Other Topics Concern  . Not on file   Social History Narrative  . No narrative on file   Past Surgical History:  Procedure Laterality Date  . APPLICATION OF CRANIAL NAVIGATION Right 10/03/2015   Procedure: APPLICATION OF CRANIAL NAVIGATION;  Surgeon: Consuella Lose, MD;  Location: [place] [facility];  Service: Neurosurgery;  Laterality: Right;  . CESAREAN SECTION    . CRANIOTOMY Right 10/03/2015   Procedure: Stereotactic right frontoparietal craniotomy for excisional biopsy of lesion ;  Surgeon: Consuella Lose, MD;  Location: [place] [facility];  Service: Neurosurgery;  Laterality: Right;  . OOPHORECTOMY    . SHOULDER SURGERY    . TOOTH EXTRACTION N/A 10/07/2015   Procedure: Extraction of tooth #18 with alveoloplasty;  Surgeon: Lenn Cal, DDS;  Location: [place];  Service: Oral Surgery;  Laterality: N/A;  . TUBAL LIGATION     Past Medical History:  Diagnosis Date  . Asthma   . Chronic knee pain    right  . Meniscus tear   .  Perimenopausal   . Rheumatoid arthritis ([place])   . Rotator cuff tear   . Slipped intervertebral disc    LMP 09/27/2011   Opioid Risk Score:   Fall Risk Score:  `1  Depression screen PHQ 2/9  Depression screen [facility] 2/9 05/14/2016 12/06/2015 11/10/2015  Decreased Interest 1 1 0  Down, Depressed, Hopeless 1 1 0  PHQ - 2 Score 2 2 0  Altered sleeping - 1 -  Tired, decreased energy - 1 -  Change in appetite - 0 -  Feeling bad or failure about yourself  - 0 -  Trouble concentrating - 1 -  Moving slowly or fidgety/restless - 1 -  Suicidal thoughts - 0 -  PHQ-9 Score - 6 -  Difficult doing work/chores - Somewhat difficult -   Review of Systems  Constitutional: Negative.   HENT: Negative.   Eyes: Negative.    Respiratory: Negative.   Cardiovascular: Negative.   Gastrointestinal: Negative.   Endocrine: Negative.   Genitourinary: Negative.   Musculoskeletal: Negative.   Skin: Negative.   Allergic/Immunologic: Negative.   Neurological: Positive for dizziness.  Hematological: Negative.   Psychiatric/Behavioral: Positive for confusion.  All other systems reviewed and are negative.      Objective:   Physical Exam  Constitutional: She is oriented to person, place, and time. She appears well-developed and well-nourished.  HENT:  Head: Normocephalic and atraumatic.  Neck: Normal range of motion. Neck supple.  Cardiovascular: Normal rate and regular rhythm.   Pulmonary/Chest: Effort normal and breath sounds normal.  Musculoskeletal:  Normal Muscle Bulk and Muscle Testing Reveals: Upper Extremities: Full ROM and Muscle Strength 5/5 Thoracic Paraspinal Tenderness: T-1- T-3 Mainly Right Side Lumbar Paraspinal Tenderness: L-4-L-5 Mainly Right Side Lower Extremities: Full ROM and Muscle Strength 5/5 Arises from Table Slowly Narrow Based Gait  Neurological: She is alert and oriented to person, place, and time.  Skin: Skin is warm and dry.  Psychiatric: She has a normal mood and affect.  Nursing note and vitals reviewed.         Assessment & Plan:  1. 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 Headaches: Refilled: Percocet 5/325 mg one tablet every 8 hours As needed for severe painas needed  increase #65. We will continue the opioid monitoring program, this consists of regular clinic visits, examinations, urine drug screen, pill counts as well as use of North  3. Mood: Continue Cymbalta   25 minutes of face to face patient care time wasspent during this visit. All questions were encouraged and answered.    Follow up in 1 month

## 2016-09-24 ENCOUNTER — Encounter: Payer: Medicaid Other | Attending: Physical Medicine & Rehabilitation | Admitting: Registered Nurse

## 2016-09-24 VITALS — BP 142/86 | HR 72 | Resp 14

## 2016-09-24 DIAGNOSIS — R42 Dizziness and giddiness: Secondary | ICD-10-CM | POA: Diagnosis not present

## 2016-09-24 DIAGNOSIS — G060 Intracranial abscess and granuloma: Secondary | ICD-10-CM | POA: Insufficient documentation

## 2016-09-24 DIAGNOSIS — G8194 Hemiplegia, unspecified affecting left nondominant side: Secondary | ICD-10-CM | POA: Diagnosis not present

## 2016-09-24 DIAGNOSIS — M069 Rheumatoid arthritis, unspecified: Secondary | ICD-10-CM | POA: Insufficient documentation

## 2016-09-24 DIAGNOSIS — J45909 Unspecified asthma, uncomplicated: Secondary | ICD-10-CM | POA: Diagnosis not present

## 2016-09-24 DIAGNOSIS — R531 Weakness: Secondary | ICD-10-CM | POA: Insufficient documentation

## 2016-09-24 DIAGNOSIS — Z5181 Encounter for therapeutic drug level monitoring: Secondary | ICD-10-CM | POA: Diagnosis not present

## 2016-09-24 DIAGNOSIS — Z800 Family history of malignant neoplasm of digestive organs: Secondary | ICD-10-CM | POA: Insufficient documentation

## 2016-09-24 DIAGNOSIS — Z833 Family history of diabetes mellitus: Secondary | ICD-10-CM | POA: Diagnosis not present

## 2016-09-24 DIAGNOSIS — R51 Headache: Secondary | ICD-10-CM | POA: Diagnosis not present

## 2016-09-24 DIAGNOSIS — G8929 Other chronic pain: Secondary | ICD-10-CM | POA: Diagnosis present

## 2016-09-24 DIAGNOSIS — S83206A Unspecified tear of unspecified meniscus, current injury, right knee, initial encounter: Secondary | ICD-10-CM | POA: Diagnosis not present

## 2016-09-24 DIAGNOSIS — R519 Headache, unspecified: Secondary | ICD-10-CM

## 2016-09-24 DIAGNOSIS — Z811 Family history of alcohol abuse and dependence: Secondary | ICD-10-CM | POA: Diagnosis not present

## 2016-09-24 DIAGNOSIS — Z808 Family history of malignant neoplasm of other organs or systems: Secondary | ICD-10-CM | POA: Diagnosis not present

## 2016-09-24 DIAGNOSIS — G894 Chronic pain syndrome: Secondary | ICD-10-CM

## 2016-09-24 DIAGNOSIS — Z79899 Other long term (current) drug therapy: Secondary | ICD-10-CM

## 2016-09-24 DIAGNOSIS — R410 Disorientation, unspecified: Secondary | ICD-10-CM | POA: Diagnosis not present

## 2016-09-24 MED ORDER — METFORMIN HCL 500 MG PO TABS: ORAL_TABLET | ORAL | 0 refills | Status: DC

## 2016-09-24 MED ORDER — OXYCODONE-ACETAMINOPHEN 5-325 MG PO TABS: 1.0000 | ORAL_TABLET | Freq: Three times a day (TID) | ORAL | 0 refills | Status: DC | PRN

## 2016-09-24 NOTE — Progress Notes (Signed)
Subjective:    Patient ID: [name], female    DOB: June 17, 1966, 51 y.o.   MRN: [id number]  HPI: [name] is a 51 year old female who returns for follow up appointment for chronic pain  and medication refill. She states no pain today. Last night she rated her pain 6. States she has been doing very well.Her current exercise regime is walking. Also states she went to [place] to visit her sick Aunt and left her Percocet their.She will be returning to [place] in three weeks she states. Her aunt did not want to send her medication through the mail. According to [place] was picked up on 08/17/2016. [name] was already in the process of weaning the Percocet and has tolerated the weaning.    Pain Inventory Average Pain 7 Pain Right Now 6 My pain is dull  In the last 24 hours, has pain interfered with the following? General activity 7 Relation with others 3 Enjoyment of life 4 What TIME of day is your pain at its worst? morning Sleep (in general) Fair  Pain is worse with: bending, inactivity and some activites Pain improves with: n/a Relief from Meds: 10  Mobility how many minutes can you walk? 30-60 ability to climb steps?  yes do you drive?  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  no  Physicians involved in your care Any changes since last visit?  no   Family History  Problem Relation Age of Onset  . Cancer Mother     colon cancer  . Cancer Father     brain cancer  . Alcohol abuse Father   . Diabetes Father    Social History   Social History  . Marital status: Divorced    Spouse name: N/A  . Number of children: N/A  . Years of education: N/A   Social History Main Topics  . Smoking status: Never Smoker  . Smokeless tobacco: Never Used  . Alcohol use No  . Drug use: No  . Sexual activity: Not Currently   Other Topics Concern  .  Not on file   Social History Narrative  . No narrative on file   Past Surgical History:  Procedure Laterality Date  . APPLICATION OF CRANIAL NAVIGATION Right 10/03/2015   Procedure: APPLICATION OF CRANIAL NAVIGATION;  Surgeon: Consuella Lose, MD;  Location: [place] [facility];  Service: Neurosurgery;  Laterality: Right;  . CESAREAN SECTION    . CRANIOTOMY Right 10/03/2015   Procedure: Stereotactic right frontoparietal craniotomy for excisional biopsy of lesion ;  Surgeon: Consuella Lose, MD;  Location: [place] [facility];  Service: Neurosurgery;  Laterality: Right;  . OOPHORECTOMY    . SHOULDER SURGERY    . TOOTH EXTRACTION N/A 10/07/2015   Procedure: Extraction of tooth #18 with alveoloplasty;  Surgeon: Lenn Cal, DDS;  Location: [place];  Service: Oral Surgery;  Laterality: N/A;  . TUBAL LIGATION     Past Medical History:  Diagnosis Date  . Asthma   . Chronic knee pain    right  . Meniscus tear   . Perimenopausal   . Rheumatoid arthritis ([place])   . Rotator cuff tear   . Slipped intervertebral disc    LMP 09/27/2011   Opioid Risk Score:   Fall Risk Score:  `1  Depression screen PHQ 2/9  Depression screen [facility] 2/9 09/24/2016 05/14/2016 12/06/2015 11/10/2015  Decreased Interest 1 1 1  0  Down, Depressed, Hopeless 1 1 1  0  PHQ - 2 Score 2 2 2  0  Altered sleeping - - 1 -  Tired, decreased energy - - 1 -  Change in appetite - - 0 -  Feeling bad or failure about yourself  - - 0 -  Trouble concentrating - - 1 -  Moving slowly or fidgety/restless - - 1 -  Suicidal thoughts - - 0 -  PHQ-9 Score - - 6 -  Difficult doing work/chores - - Somewhat difficult -    Review of Systems  Constitutional: Negative.   HENT: Negative.   Eyes: Negative.   Respiratory: Negative.   Cardiovascular: Negative.   Gastrointestinal: Negative.   Endocrine: Negative.   Genitourinary: Negative.   Musculoskeletal: Negative.   Skin: Negative.   Neurological: Negative.   Hematological: Negative.     Psychiatric/Behavioral: Negative.   All other systems reviewed and are negative.      Objective:   Physical Exam  Constitutional: She is oriented to person, place, and time. She appears well-developed and well-nourished.  HENT:  Head: Normocephalic and atraumatic.  Neck: Normal range of motion. Neck supple.  Cardiovascular: Normal rate and regular rhythm.   Pulmonary/Chest: Effort normal and breath sounds normal.  Musculoskeletal:  Normal Muscle Bulk and Muscle Testing Reveals: Upper Extremities: Full ROM and Muscle Strength 5/5 Lower Extremities: Full ROM and Muscle Strength 5/5 Left Lower Extremity Flexion Produces Pain into Left Patella  Arises from chair with ease Narrow Based Gait  Neurological: She is alert and oriented to person, place, and time.  Skin: Skin is warm and dry.  Psychiatric: She has a normal mood and affect.  Nursing note and vitals reviewed.         Assessment & Plan:  1. 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 Headaches: Refilled: Percocet 5/325 mg one tablet every 8 hours As needed for severe painas neededdecreased to # 40. We will continue the opioid monitoring program, this consists of regular clinic visits, examinations, urine drug screen, pill counts as well as use of North  3. Mood: Continue Cymbalta   25 minutes of face to face patient care time wasspent during this visit. All questions were encouraged and answered.    Follow up in 1 month

## 2016-09-28 LAB — TOXASSURE SELECT,+ANTIDEPR,UR

## 2016-10-04 ENCOUNTER — Telehealth: Payer: Self-pay | Admitting: Registered Nurse

## 2016-10-04 NOTE — Telephone Encounter (Signed)
On January 18,2018 NCCSR was reviewed: No conflict was seen on the [place] with Multiple Prescribers. Ms. Koenen has a signed  Narcotic Contract with our office. If there were any discrepancies this would have been  reported to her  Physcian.

## 2016-10-09 ENCOUNTER — Telehealth: Payer: Self-pay

## 2016-10-09 NOTE — Progress Notes (Addendum)
Urine drug screen for this encounter is Inconsistent for prescribed medication. Urine sample contained tramadol.

## 2016-10-09 NOTE — Telephone Encounter (Signed)
Spoke with patient and advised patient that her UDS had the presence of tramadol. Patient states she was out of the state visiting an aunt who was very sick. Upon leaving aunts house she left her medication Percocet. Aunt was uncomfortable of sending medication through the mail. Patient states she had an old bottle of tramadol an her medicine cabinet. According to [facility] she had an Rx of tramadol in 2016. Patient was advised not to take  Tramadol anymore. Take only the medication the was prescribed. Per Danella Sensing NP.

## 2016-10-12 ENCOUNTER — Telehealth: Payer: Self-pay | Admitting: Registered Nurse

## 2016-10-12 DIAGNOSIS — R51 Headache: Principal | ICD-10-CM

## 2016-10-12 DIAGNOSIS — R519 Headache, unspecified: Secondary | ICD-10-CM

## 2016-10-12 MED ORDER — OXYCODONE-ACETAMINOPHEN 5-325 MG PO TABS: 1.0000 | ORAL_TABLET | Freq: Three times a day (TID) | ORAL | 0 refills | Status: DC | PRN

## 2016-10-12 MED ORDER — GABAPENTIN 100 MG PO CAPS: 100.0000 mg | ORAL_CAPSULE | Freq: Three times a day (TID) | ORAL | 3 refills | Status: DC

## 2016-10-12 NOTE — Telephone Encounter (Signed)
[name] called stating since she's tapering the Oxycodone, she has noticed increase frequency of headache's.  Also states her son with Autism hasn't been able to get his medication from the manufacture and was prescribed a new medication, he developed side effects. She realizes this stressful event also has impacted her headaches.  In the past she was prescribed Imitrex, Topamax and Gabapentin.  The Imitrex was ineffective and Topamax and Gabapentin caused daytime drowsiness  Her gabapentin dose at that time was 300 mg.  She agrees to try Gabapentin 100 mg QHS, instructed to call office on Monday 10/15/16.  According to [facility] her Oxycodone was picked up on 09/24/2016, we will allow Ms. Rill to pick up a prescription and her tablets have been increased. She verbalizes understanding.

## 2016-11-06 ENCOUNTER — Encounter: Payer: Medicaid Other | Admitting: Physical Medicine & Rehabilitation

## 2016-11-09 ENCOUNTER — Telehealth: Payer: Self-pay | Admitting: Registered Nurse

## 2016-11-09 ENCOUNTER — Encounter: Payer: Self-pay | Admitting: Registered Nurse

## 2016-11-09 ENCOUNTER — Encounter: Payer: Medicaid Other | Attending: Physical Medicine & Rehabilitation | Admitting: Registered Nurse

## 2016-11-09 VITALS — BP 149/82 | HR 72

## 2016-11-09 DIAGNOSIS — M25562 Pain in left knee: Secondary | ICD-10-CM | POA: Diagnosis not present

## 2016-11-09 DIAGNOSIS — Z800 Family history of malignant neoplasm of digestive organs: Secondary | ICD-10-CM | POA: Insufficient documentation

## 2016-11-09 DIAGNOSIS — G060 Intracranial abscess and granuloma: Secondary | ICD-10-CM | POA: Diagnosis not present

## 2016-11-09 DIAGNOSIS — Z79899 Other long term (current) drug therapy: Secondary | ICD-10-CM

## 2016-11-09 DIAGNOSIS — R410 Disorientation, unspecified: Secondary | ICD-10-CM | POA: Diagnosis not present

## 2016-11-09 DIAGNOSIS — Z5181 Encounter for therapeutic drug level monitoring: Secondary | ICD-10-CM

## 2016-11-09 DIAGNOSIS — Z833 Family history of diabetes mellitus: Secondary | ICD-10-CM | POA: Diagnosis not present

## 2016-11-09 DIAGNOSIS — R42 Dizziness and giddiness: Secondary | ICD-10-CM | POA: Insufficient documentation

## 2016-11-09 DIAGNOSIS — M069 Rheumatoid arthritis, unspecified: Secondary | ICD-10-CM | POA: Diagnosis not present

## 2016-11-09 DIAGNOSIS — G894 Chronic pain syndrome: Secondary | ICD-10-CM | POA: Diagnosis not present

## 2016-11-09 DIAGNOSIS — G8929 Other chronic pain: Secondary | ICD-10-CM | POA: Diagnosis present

## 2016-11-09 DIAGNOSIS — S83206A Unspecified tear of unspecified meniscus, current injury, right knee, initial encounter: Secondary | ICD-10-CM | POA: Insufficient documentation

## 2016-11-09 DIAGNOSIS — J45909 Unspecified asthma, uncomplicated: Secondary | ICD-10-CM | POA: Diagnosis not present

## 2016-11-09 DIAGNOSIS — R531 Weakness: Secondary | ICD-10-CM | POA: Diagnosis not present

## 2016-11-09 DIAGNOSIS — R519 Headache, unspecified: Secondary | ICD-10-CM

## 2016-11-09 DIAGNOSIS — Z811 Family history of alcohol abuse and dependence: Secondary | ICD-10-CM | POA: Diagnosis not present

## 2016-11-09 DIAGNOSIS — Z808 Family history of malignant neoplasm of other organs or systems: Secondary | ICD-10-CM | POA: Insufficient documentation

## 2016-11-09 DIAGNOSIS — R51 Headache: Secondary | ICD-10-CM | POA: Diagnosis not present

## 2016-11-09 MED ORDER — OXYCODONE-ACETAMINOPHEN 5-325 MG PO TABS: 1.0000 | ORAL_TABLET | Freq: Three times a day (TID) | ORAL | 0 refills | Status: DC | PRN

## 2016-11-09 NOTE — Progress Notes (Signed)
Subjective:    Patient ID: [name], female    DOB: [birth year], 51 y.o.   MRN: [id number]  HPI: [name] is a 51 year old female who returns for follow up appointmentfor chronic pain and medication refill. She states her pain is located in her left knee. She seen Dr. Noemi Costa on 11/08/2016 and received a cortisone injection in her left knee, she's wearing a left knee brace. She rates her pain 7. Her current exercise regime is walking. [name], NCCSR was reviewed, her oxycodone was picked up on 10/12/2016. [name] has been weaning herself off the Oxycodone.  Also seen a new PCP who prescribed her Hydrocodone, she thought she was being prescribed Tramadol, she will returned the Hydrocodone. Reviewed the Narcotic Policy she verbalizes understanding.   Pain Inventory Average Pain 8 Pain Right Now 7 My pain is dull, stabbing and aching  In the last 24 hours, has pain interfered with the following? General activity 4 Relation with others 9 Enjoyment of life 10 What TIME of day is your pain at its worst? evening Sleep (in general) .  Pain is worse with: walking, bending, standing and some activites Pain improves with: rest, heat/ice and medication Relief from Meds: 8  Mobility walk without assistance  Function Do you have any goals in this area?  no  Neuro/Psych No problems in this area  Prior Studies Any changes since last visit?  no  Physicians involved in your care Any changes since last visit?  no   Family History  Problem Relation Age of Onset  . Cancer Mother     colon cancer  . Cancer Father     brain cancer  . Alcohol abuse Father   . Diabetes Father    Social History   Social History  . Marital status: Divorced    Spouse name: N/A  . Number of children: N/A  . Years of education: N/A   Social History Main Topics  . Smoking status: Never Smoker  . Smokeless tobacco: Never Used  . Alcohol use No  . Drug use:  No  . Sexual activity: Not Currently   Other Topics Concern  . Not on file   Social History Narrative  . No narrative on file   Past Surgical History:  Procedure Laterality Date  . APPLICATION OF CRANIAL NAVIGATION Right 10/03/2015   Procedure: APPLICATION OF CRANIAL NAVIGATION;  Surgeon: Consuella Lose, MD;  Location: [place] [facility];  Service: Neurosurgery;  Laterality: Right;  . CESAREAN SECTION    . CRANIOTOMY Right 10/03/2015   Procedure: Stereotactic right frontoparietal craniotomy for excisional biopsy of lesion ;  Surgeon: Consuella Lose, MD;  Location: [place] [facility];  Service: Neurosurgery;  Laterality: Right;  . OOPHORECTOMY    . SHOULDER SURGERY    . TOOTH EXTRACTION N/A 10/07/2015   Procedure: Extraction of tooth #18 with alveoloplasty;  Surgeon: Lenn Cal, DDS;  Location: [place];  Service: Oral Surgery;  Laterality: N/A;  . TUBAL LIGATION     Past Medical History:  Diagnosis Date  . Asthma   . Chronic knee pain    right  . Meniscus tear   . Perimenopausal   . Rheumatoid arthritis ([place])   . Rotator cuff tear   . Slipped intervertebral disc    LMP 09/27/2011   Opioid Risk Score:   Fall Risk Score:  `1  Depression screen PHQ 2/9  Depression screen [facility] 2/9 09/24/2016 05/14/2016 12/06/2015 11/10/2015  Decreased Interest 1 1 1  0  Down, Depressed, Hopeless 1 1 1  0  PHQ - 2 Score 2 2 2  0  Altered sleeping - - 1 -  Tired, decreased energy - - 1 -  Change in appetite - - 0 -  Feeling bad or failure about yourself  - - 0 -  Trouble concentrating - - 1 -  Moving slowly or fidgety/restless - - 1 -  Suicidal thoughts - - 0 -  PHQ-9 Score - - 6 -  Difficult doing work/chores - - Somewhat difficult -    Review of Systems  Constitutional: Negative.   HENT: Negative.   Eyes: Negative.   Respiratory: Negative.   Cardiovascular: Negative.   Gastrointestinal: Negative.   Endocrine: Negative.   Genitourinary: Negative.   Musculoskeletal: Positive for joint  swelling.  Skin: Negative.   Allergic/Immunologic: Negative.   Neurological: Negative.   Hematological: Negative.   Psychiatric/Behavioral: Negative.   All other systems reviewed and are negative.      Objective:   Physical Exam  Constitutional: She is oriented to person, place, and time. She appears well-developed and well-nourished.  HENT:  Head: Normocephalic and atraumatic.  Neck: Normal range of motion. Neck supple.  Cardiovascular: Normal rate and regular rhythm.   Pulmonary/Chest: Effort normal and breath sounds normal.  Musculoskeletal:  Normal Muscle Bulk and Muscle Testing Reveals: Upper Extremities: Full ROM and Muscle Strength 5/5 Lower Extremities: Full ROM and Muscle Strength 5/5 Left Lower Extremity Flexion Produces Pain into Patella Arises from Table with ease Narrow Based Gait  Neurological: She is alert and oriented to person, place, and time.  Skin: Skin is warm and dry.  Psychiatric: She has a normal mood and affect.  Nursing note and vitals reviewed.         Assessment & Plan:  1. 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 11/09/2016 2. Headaches: Refilled: Percocet 5/325 mg one tablet every 8 hours As needed for severe painas needed Decreased #40. We will continue the opioid monitoring program, this consists of regular clinic visits, examinations, urine drug screen, pill counts as well as use of North  3. Mood: Continue Cymbalta. 11/09/2016 4. Left Knee Pain: Dr. Noemi Costa Following.  30 minutes of face to face patient care time was spent during this visit. All questions were encouraged and answered.   Follow up in 1 month

## 2016-11-09 NOTE — Telephone Encounter (Signed)
On 11/09/2016 NCCSR was reviewed no conflict was seen on the [facility] Controlled Substance Reporting System. [name] has a signed narcotic contract with our office. If there were any discrepancies this would have been reported to her physician.

## 2016-11-12 ENCOUNTER — Telehealth: Payer: Self-pay | Admitting: *Deleted

## 2016-11-12 NOTE — Telephone Encounter (Addendum)
Prior auth for oxycodone acetaminophen 5/325 #40 initiated with [employer].  Prior Approval #: [id number]  Confirmation #:[id number] F. Approved 11/13/16-05/12/17

## 2016-11-14 ENCOUNTER — Ambulatory Visit: Payer: Self-pay | Admitting: Physical Medicine & Rehabilitation

## 2016-12-04 ENCOUNTER — Encounter: Payer: Medicaid Other | Attending: Physical Medicine & Rehabilitation | Admitting: *Deleted

## 2016-12-04 ENCOUNTER — Encounter: Payer: Self-pay | Admitting: *Deleted

## 2016-12-04 ENCOUNTER — Encounter: Payer: Medicaid Other | Admitting: Registered Nurse

## 2016-12-04 ENCOUNTER — Telehealth: Payer: Self-pay | Admitting: *Deleted

## 2016-12-04 VITALS — BP 136/82 | HR 76

## 2016-12-04 DIAGNOSIS — M069 Rheumatoid arthritis, unspecified: Secondary | ICD-10-CM | POA: Insufficient documentation

## 2016-12-04 DIAGNOSIS — R531 Weakness: Secondary | ICD-10-CM | POA: Insufficient documentation

## 2016-12-04 DIAGNOSIS — Z800 Family history of malignant neoplasm of digestive organs: Secondary | ICD-10-CM | POA: Diagnosis not present

## 2016-12-04 DIAGNOSIS — G8929 Other chronic pain: Secondary | ICD-10-CM | POA: Diagnosis present

## 2016-12-04 DIAGNOSIS — G060 Intracranial abscess and granuloma: Secondary | ICD-10-CM | POA: Insufficient documentation

## 2016-12-04 DIAGNOSIS — R42 Dizziness and giddiness: Secondary | ICD-10-CM | POA: Insufficient documentation

## 2016-12-04 DIAGNOSIS — G894 Chronic pain syndrome: Secondary | ICD-10-CM

## 2016-12-04 DIAGNOSIS — R51 Headache: Secondary | ICD-10-CM | POA: Insufficient documentation

## 2016-12-04 DIAGNOSIS — F4323 Adjustment disorder with mixed anxiety and depressed mood: Secondary | ICD-10-CM

## 2016-12-04 DIAGNOSIS — J45909 Unspecified asthma, uncomplicated: Secondary | ICD-10-CM | POA: Diagnosis not present

## 2016-12-04 DIAGNOSIS — Z833 Family history of diabetes mellitus: Secondary | ICD-10-CM | POA: Diagnosis not present

## 2016-12-04 DIAGNOSIS — Z79899 Other long term (current) drug therapy: Secondary | ICD-10-CM

## 2016-12-04 DIAGNOSIS — S83206A Unspecified tear of unspecified meniscus, current injury, right knee, initial encounter: Secondary | ICD-10-CM | POA: Insufficient documentation

## 2016-12-04 DIAGNOSIS — Z811 Family history of alcohol abuse and dependence: Secondary | ICD-10-CM | POA: Diagnosis not present

## 2016-12-04 DIAGNOSIS — Z808 Family history of malignant neoplasm of other organs or systems: Secondary | ICD-10-CM | POA: Insufficient documentation

## 2016-12-04 DIAGNOSIS — R519 Headache, unspecified: Secondary | ICD-10-CM

## 2016-12-04 DIAGNOSIS — R410 Disorientation, unspecified: Secondary | ICD-10-CM | POA: Insufficient documentation

## 2016-12-04 DIAGNOSIS — Z5181 Encounter for therapeutic drug level monitoring: Secondary | ICD-10-CM

## 2016-12-04 MED ORDER — OXYCODONE-ACETAMINOPHEN 5-325 MG PO TABS: 1.0000 | ORAL_TABLET | Freq: Three times a day (TID) | ORAL | 0 refills | Status: DC | PRN

## 2016-12-04 MED ORDER — DULOXETINE HCL 20 MG PO CPEP: 20.0000 mg | ORAL_CAPSULE | Freq: Every day | ORAL | 2 refills | Status: DC

## 2016-12-04 NOTE — Progress Notes (Signed)
[name] is here for a medication refill with RN due to [place] NP being out with illness.  She has been swimming at the [facility] and cutting back on carbs due to diagnosis of DM2.  She is a little concerned because of the medication she is taking (metformin) due to the gastric side effects are bothersome and her headaches have increased.  She only sees her PCP and we talked about possibly finding an endocrinologist that can help guide her through her diagnosis and watch her hA1C.  She says it has decreased from 6.3 to  5.6 last one.  She says that Dr Naaman Plummer talked with her about this before and had mentioned a couple of other medications that might be better since the Metformin is causing her headaches to increase.  I will send him a message about this and let her know what he recommends.  She is not very comfortable with her PCP at this time and is seeing the NP(?) Caryl Pina. Her oxycodone 5/325 #40 was filled 11/11/16 and has #0 today because of the increased headaches.  She takes her gabapentin but it makes her groggy and with her twins and son with autism she is a little hesitant to take it the tid as ordered. She said the Voltaren Gel 1% did not really do much for her.  I checked the [place] and it is appropriate. I refilled her oxycodone 5/325 #40  and her Cymbalta. She wil return to see Dr Naaman Plummer on her scheduled appt May 1.  She may need to call for refill.

## 2016-12-04 NOTE — Telephone Encounter (Signed)
Before she tries anything different, I would recommend just stopping the metformin completely while keeping up dietary changes and exercise. If it goes up, we can look at other options

## 2016-12-04 NOTE — Telephone Encounter (Signed)
Left message to call office. No name on VM.

## 2016-12-04 NOTE — Telephone Encounter (Signed)
[name] was in for visit today.  She is concerned because she has been put on Metformin for her dx of DM2.  Her HgA1c has come down from 6.3 to 5.6.  She is swimming more at the [facility] and trying to watch her carb intake.  She is not comfortable with her PCP Osei Bonsu and has been only seeing his ?NP? Caryl Pina. The metformin is causing her a lot of hypermotility of her lower GI and has increased her headaches quite a bit.  She was doing great decreasing her oxycodone 5/325 but had to take it more often this past month with the headaches.  She says that you mentioned to her a couple of other medications that may be better for her than the metformin and was wanting to know what you thought. I am not sure if there is an endocrinologist that takes medicaid that you could recommend to oversee her hA1c and meds. Please advise.

## 2016-12-17 ENCOUNTER — Telehealth: Payer: Self-pay | Admitting: Physical Medicine & Rehabilitation

## 2016-12-17 NOTE — Telephone Encounter (Signed)
Patient needs refill on oxycodone and patient also has questions about gabapentin.  She would like for someone to call her

## 2016-12-17 NOTE — Telephone Encounter (Signed)
Placed a call to Ms. Conry, no answer left message to return the call in the morning.

## 2016-12-18 ENCOUNTER — Telehealth: Payer: Self-pay | Admitting: Registered Nurse

## 2016-12-18 ENCOUNTER — Telehealth: Payer: Self-pay

## 2016-12-18 DIAGNOSIS — R51 Headache: Principal | ICD-10-CM

## 2016-12-18 DIAGNOSIS — R519 Headache, unspecified: Secondary | ICD-10-CM

## 2016-12-18 MED ORDER — OXYCODONE-ACETAMINOPHEN 5-325 MG PO TABS: 1.0000 | ORAL_TABLET | Freq: Three times a day (TID) | ORAL | 0 refills | Status: DC | PRN

## 2016-12-18 NOTE — Telephone Encounter (Signed)
[name] return call from yesterday, she states she has been experiencing increase frequency of headaches. Last month she was trying to wean her Oxycodone, and her headaches increased in frequency, she has been using Oxycodone BID and some days TID. We will increase her Gabapentin to 200 mg in am, 100 mg late afternoon and 200 mg HS.  She verbalizes understanding.  We will increase her Oxycodone tablets to 75 this month, she has a F/U appointment with Dr. Naaman Plummer 01/15/2017.  Her friend Legrand Pitts may pick up her prescription, she was instructed to bring her license. She verbalizes understanding.  [name] is on her way to take her son to [facility] for Flu symptoms. She states.

## 2016-12-18 NOTE — Telephone Encounter (Signed)
Patient has called stating her son has tested positive for pneumonia, flu and strep throat. Patient has been experiencing a sore throat. Patient would like to know if it was possibly to receive an rx for an antibiotic please advise. NP states patient must go to PCP or urgent care. Patient has been advised and very appreciative.

## 2017-01-15 ENCOUNTER — Encounter: Payer: Self-pay | Admitting: Physical Medicine & Rehabilitation

## 2017-01-15 ENCOUNTER — Encounter: Payer: Medicaid Other | Attending: Physical Medicine & Rehabilitation | Admitting: Physical Medicine & Rehabilitation

## 2017-01-15 VITALS — BP 140/83 | HR 74

## 2017-01-15 DIAGNOSIS — Z79899 Other long term (current) drug therapy: Secondary | ICD-10-CM | POA: Diagnosis not present

## 2017-01-15 DIAGNOSIS — G8929 Other chronic pain: Secondary | ICD-10-CM | POA: Diagnosis present

## 2017-01-15 DIAGNOSIS — G060 Intracranial abscess and granuloma: Secondary | ICD-10-CM | POA: Diagnosis present

## 2017-01-15 DIAGNOSIS — Z800 Family history of malignant neoplasm of digestive organs: Secondary | ICD-10-CM | POA: Diagnosis not present

## 2017-01-15 DIAGNOSIS — R531 Weakness: Secondary | ICD-10-CM | POA: Insufficient documentation

## 2017-01-15 DIAGNOSIS — S83206A Unspecified tear of unspecified meniscus, current injury, right knee, initial encounter: Secondary | ICD-10-CM | POA: Diagnosis not present

## 2017-01-15 DIAGNOSIS — G939 Disorder of brain, unspecified: Secondary | ICD-10-CM

## 2017-01-15 DIAGNOSIS — M069 Rheumatoid arthritis, unspecified: Secondary | ICD-10-CM | POA: Insufficient documentation

## 2017-01-15 DIAGNOSIS — R42 Dizziness and giddiness: Secondary | ICD-10-CM | POA: Insufficient documentation

## 2017-01-15 DIAGNOSIS — G894 Chronic pain syndrome: Secondary | ICD-10-CM | POA: Diagnosis not present

## 2017-01-15 DIAGNOSIS — Z833 Family history of diabetes mellitus: Secondary | ICD-10-CM | POA: Insufficient documentation

## 2017-01-15 DIAGNOSIS — J45909 Unspecified asthma, uncomplicated: Secondary | ICD-10-CM | POA: Insufficient documentation

## 2017-01-15 DIAGNOSIS — R410 Disorientation, unspecified: Secondary | ICD-10-CM | POA: Insufficient documentation

## 2017-01-15 DIAGNOSIS — Z811 Family history of alcohol abuse and dependence: Secondary | ICD-10-CM | POA: Insufficient documentation

## 2017-01-15 DIAGNOSIS — G8194 Hemiplegia, unspecified affecting left nondominant side: Secondary | ICD-10-CM

## 2017-01-15 DIAGNOSIS — R51 Headache: Secondary | ICD-10-CM | POA: Insufficient documentation

## 2017-01-15 DIAGNOSIS — R519 Headache, unspecified: Secondary | ICD-10-CM

## 2017-01-15 DIAGNOSIS — Z808 Family history of malignant neoplasm of other organs or systems: Secondary | ICD-10-CM | POA: Diagnosis not present

## 2017-01-15 DIAGNOSIS — Z5181 Encounter for therapeutic drug level monitoring: Secondary | ICD-10-CM | POA: Diagnosis not present

## 2017-01-15 MED ORDER — GABAPENTIN 100 MG PO CAPS: 100.0000 mg | ORAL_CAPSULE | Freq: Three times a day (TID) | ORAL | 3 refills | Status: DC

## 2017-01-15 MED ORDER — OXYCODONE-ACETAMINOPHEN 5-325 MG PO TABS: 0.5000 | ORAL_TABLET | Freq: Three times a day (TID) | ORAL | 0 refills | Status: DC | PRN

## 2017-01-15 NOTE — Progress Notes (Signed)
Subjective:    Patient ID: [name], female    DOB: 02-May-1966, 51 y.o.   MRN: [id number]  HPI  December is here in follow up of her brain abscess and associated pain/headaches. She is feeling better lately now that flu season is over. Her sons are feeling better. She just chaperoned a class field trip and did a lot of walking. She tolerated it well.   She is taking percocet 2.5/325 3x daily on average. She feels that she's ready to "decrease". She has been using the gabapentin more consistently which has helped. She is taking 100mg  TID currently. She has no sedating side effects.    Pain Inventory Average Pain 7 Pain Right Now 7 My pain is dull  In the last 24 hours, has pain interfered with the following? General activity 6 Relation with others 6 Enjoyment of life 6 What TIME of day is your pain at its worst? morning Sleep (in general) Poor  Pain is worse with: bending and some activites Pain improves with: medication Relief from Meds: 7  Mobility walk without assistance ability to climb steps?  yes do you drive?  yes  Function employed # of hrs/week 30  Neuro/Psych No problems in this area  Prior Studies Any changes since last visit?  no  Physicians involved in your care Any changes since last visit?  no   Family History  Problem Relation Age of Onset  . Cancer Mother     colon cancer  . Cancer Father     brain cancer  . Alcohol abuse Father   . Diabetes Father    Social History   Social History  . Marital status: Divorced    Spouse name: N/A  . Number of children: N/A  . Years of education: N/A   Social History Main Topics  . Smoking status: Never Smoker  . Smokeless tobacco: Never Used  . Alcohol use No  . Drug use: No  . Sexual activity: Not Currently   Other Topics Concern  . None   Social History Narrative  . None   Past Surgical History:  Procedure Laterality Date  . APPLICATION OF CRANIAL NAVIGATION Right 10/03/2015   Procedure:  APPLICATION OF CRANIAL NAVIGATION;  Surgeon: Consuella Lose, MD;  Location: [place] [facility];  Service: Neurosurgery;  Laterality: Right;  . CESAREAN SECTION    . CRANIOTOMY Right 10/03/2015   Procedure: Stereotactic right frontoparietal craniotomy for excisional biopsy of lesion ;  Surgeon: Consuella Lose, MD;  Location: [place] [facility];  Service: Neurosurgery;  Laterality: Right;  . OOPHORECTOMY    . SHOULDER SURGERY    . TOOTH EXTRACTION N/A 10/07/2015   Procedure: Extraction of tooth #18 with alveoloplasty;  Surgeon: Lenn Cal, DDS;  Location: [place];  Service: Oral Surgery;  Laterality: N/A;  . TUBAL LIGATION     Past Medical History:  Diagnosis Date  . Asthma   . Chronic knee pain    right  . Meniscus tear   . Perimenopausal   . Rheumatoid arthritis ([place])   . Rotator cuff tear   . Slipped intervertebral disc    BP 140/83   Pulse 74   LMP 09/27/2011   SpO2 97%   Opioid Risk Score:   Fall Risk Score:  `1  Depression screen PHQ 2/9  Depression screen [facility] 2/9 12/04/2016 09/24/2016 05/14/2016 12/06/2015 11/10/2015  Decreased Interest 1 1 1 1  0  Down, Depressed, Hopeless 1 1 1 1  0  PHQ - 2 Score  2 2 2 2  0  Altered sleeping - - - 1 -  Tired, decreased energy - - - 1 -  Change in appetite - - - 0 -  Feeling bad or failure about yourself  - - - 0 -  Trouble concentrating - - - 1 -  Moving slowly or fidgety/restless - - - 1 -  Suicidal thoughts - - - 0 -  PHQ-9 Score - - - 6 -  Difficult doing work/chores - - - Somewhat difficult -    Review of Systems  Constitutional: Negative.   HENT: Negative.   Eyes: Negative.   Respiratory: Negative.   Cardiovascular: Negative.   Gastrointestinal: Negative.   Endocrine: Negative.   Genitourinary: Negative.   Musculoskeletal: Negative.   Skin: Negative.   Allergic/Immunologic: Negative.   Neurological: Negative.   Hematological: Negative.   Psychiatric/Behavioral: Negative.   All other systems reviewed and are  negative.      Objective:   Physical Exam  Constitutional: She is oriented to person, place, and time. She appears well-developed and well-nourished.  HENT: oral mucosa pink and moist Head: Normocephalic. Crani incision intact with staples Right Ear: External ear normal.  Left Ear: External ear normal.  Eyes: Conjunctivae and EOM are normal.  Neck: Normal range of motion. Neck supple. No thyromegaly present.  Cardiovascular: RRR Respiratory: cta b  GI: Soft. Bowel sounds are normal. She exhibits no distension.  Musculoskeletal:  PROM Neurological: She is alert and oriented to person, place, and time. i see no delays in language or processing. Attention is good.  Sensation 2/2. Motor: R UE/RLE: 5/5 proximal distal L UE: 5/5 deltoid, bicep,tricep, wrist, HI.  LLE: 5/5. Normal gait.   Skin: Skin is warm and dry.  Psychiatric: Her behavior is normal, pleasant   Assessment & Plan:  1. 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 Reviewed some long term sequelae from her injury including fatigue.  -still needs to work on pacing herself and taking time for herself.  2. Headaches: Percocet as needed. refilled #55. 0.5-1 tab 3x daily. Plan is to wean down to tramadol -discussed gabapentin titration. Continue 100mg  tid with increase of HS dose to 200mg  ultimately to TID as tolerated and needed. Wrote new rx for #150 -continue cymbalta for pain/reactive mood 4. Mood:  Cymbalta, needs to make time for stress relief/work on coping scares 5. Asthma:per primary    15 minutes of face to face patient care time were spent during this visit. All questions were encouraged and answered.  Follow up in two months. Greater than 50% of time during this encounter was spent counseling patient/family in regard to pain regimen, migraine prophylaxis.

## 2017-01-15 NOTE — Patient Instructions (Signed)
PLEASE FEEL FREE TO CALL OUR OFFICE WITH ANY PROBLEMS OR QUESTIONS ([phone])      

## 2017-01-22 LAB — TOXASSURE SELECT,+ANTIDEPR,UR

## 2017-01-23 ENCOUNTER — Telehealth: Payer: Self-pay | Admitting: Registered Nurse

## 2017-01-23 NOTE — Telephone Encounter (Signed)
[name] had a UDS on 01/15/2017, her last dose of Oxycodone was 4 days ago. This will make her UDS consistent with the above information.  According to [facility] her Oxycodone was picked up on 12/18/2016 .

## 2017-02-08 ENCOUNTER — Telehealth: Payer: Self-pay | Admitting: Registered Nurse

## 2017-02-08 NOTE — Telephone Encounter (Signed)
On 02/08/2017 the [place] was reviewed no conflict was seen on the [place] with multiple prescribers. Ms. Dishon has a signed narcotic contract with our office. If there were any discrepancies this would have been reported to her physician.

## 2017-02-12 ENCOUNTER — Encounter: Payer: Self-pay | Admitting: Registered Nurse

## 2017-02-12 ENCOUNTER — Encounter (HOSPITAL_BASED_OUTPATIENT_CLINIC_OR_DEPARTMENT_OTHER): Payer: Medicaid Other | Admitting: Registered Nurse

## 2017-02-12 VITALS — BP 140/82 | HR 77 | Resp 14

## 2017-02-12 DIAGNOSIS — R519 Headache, unspecified: Secondary | ICD-10-CM

## 2017-02-12 DIAGNOSIS — R51 Headache: Secondary | ICD-10-CM

## 2017-02-12 DIAGNOSIS — Z79899 Other long term (current) drug therapy: Secondary | ICD-10-CM

## 2017-02-12 DIAGNOSIS — G060 Intracranial abscess and granuloma: Secondary | ICD-10-CM | POA: Diagnosis not present

## 2017-02-12 DIAGNOSIS — Z5181 Encounter for therapeutic drug level monitoring: Secondary | ICD-10-CM

## 2017-02-12 DIAGNOSIS — G894 Chronic pain syndrome: Secondary | ICD-10-CM

## 2017-02-12 MED ORDER — METHYLPREDNISOLONE 4 MG PO TBPK: ORAL_TABLET | ORAL | 0 refills | Status: DC

## 2017-02-12 MED ORDER — OXYCODONE-ACETAMINOPHEN 5-325 MG PO TABS: 0.5000 | ORAL_TABLET | Freq: Three times a day (TID) | ORAL | 0 refills | Status: DC | PRN

## 2017-02-12 NOTE — Progress Notes (Signed)
Subjective:    Patient ID: [name], female    DOB: [birth year], 51 y.o.   MRN: [id number]  HPI: [name] is a 51 year old female who returns for follow up appointmentfor chronicpain and medication refill. She states her pain is located in her head ( headache).She rates her pain 7. Her current exercise regime is walking.  We will continue with a slow tapering of Oxycodone as [name] requested.  Her last UDS 01/15/2017, inconsistent for Oxycodone.   Pain Inventory Average Pain 7 Pain Right Now 7 My pain is aching  In the last 24 hours, has pain interfered with the following? General activity 8 Relation with others 8 Enjoyment of life 8 What TIME of day is your pain at its worst? morning, night Sleep (in general) Poor  Pain is worse with: sitting and inactivity Pain improves with: medication Relief from Meds: 6  Mobility Do you have any goals in this area?  no  Function Do you have any goals in this area?  no  Neuro/Psych anxiety  Prior Studies Any changes since last visit?  no  Physicians involved in your care Any changes since last visit?  no   Family History  Problem Relation Age of Onset  . Cancer Mother        colon cancer  . Cancer Father        brain cancer  . Alcohol abuse Father   . Diabetes Father    Social History   Social History  . Marital status: Divorced    Spouse name: N/A  . Number of children: N/A  . Years of education: N/A   Social History Main Topics  . Smoking status: Never Smoker  . Smokeless tobacco: Never Used  . Alcohol use No  . Drug use: No  . Sexual activity: Not Currently   Other Topics Concern  . None   Social History Narrative  . None   Past Surgical History:  Procedure Laterality Date  . APPLICATION OF CRANIAL NAVIGATION Right 10/03/2015   Procedure: APPLICATION OF CRANIAL NAVIGATION;  Surgeon: Consuella Lose, MD;  Location: [place] [facility];  Service: Neurosurgery;  Laterality: Right;  . CESAREAN  SECTION    . CRANIOTOMY Right 10/03/2015   Procedure: Stereotactic right frontoparietal craniotomy for excisional biopsy of lesion ;  Surgeon: Consuella Lose, MD;  Location: [place] [facility];  Service: Neurosurgery;  Laterality: Right;  . OOPHORECTOMY    . SHOULDER SURGERY    . TOOTH EXTRACTION N/A 10/07/2015   Procedure: Extraction of tooth #18 with alveoloplasty;  Surgeon: Lenn Cal, DDS;  Location: [place];  Service: Oral Surgery;  Laterality: N/A;  . TUBAL LIGATION     Past Medical History:  Diagnosis Date  . Asthma   . Chronic knee pain    right  . Meniscus tear   . Perimenopausal   . Rheumatoid arthritis ([place])   . Rotator cuff tear   . Slipped intervertebral disc    BP 140/82 (BP Location: Left Arm, Patient Position: Sitting, Cuff Size: Large)   Pulse 77   Resp 14   LMP 09/27/2011   SpO2 95%   Opioid Risk Score:   Fall Risk Score:  `1  Depression screen PHQ 2/9  Depression screen [facility] 2/9 12/04/2016 09/24/2016 05/14/2016 12/06/2015 11/10/2015  Decreased Interest 1 1 1 1  0  Down, Depressed, Hopeless 1 1 1 1  0  PHQ - 2 Score 2 2 2 2  0  Altered sleeping - - -  1 -  Tired, decreased energy - - - 1 -  Change in appetite - - - 0 -  Feeling bad or failure about yourself  - - - 0 -  Trouble concentrating - - - 1 -  Moving slowly or fidgety/restless - - - 1 -  Suicidal thoughts - - - 0 -  PHQ-9 Score - - - 6 -  Difficult doing work/chores - - - Somewhat difficult -   Review of Systems  Constitutional: Negative.   HENT: Negative.   Eyes: Negative.   Respiratory: Negative.   Cardiovascular: Negative.   Gastrointestinal: Negative.   Endocrine: Negative.   Genitourinary: Negative.   Musculoskeletal: Positive for arthralgias, back pain and myalgias.  Skin: Negative.   Allergic/Immunologic: Negative.   Neurological: Negative.   Hematological: Negative.   Psychiatric/Behavioral: The patient is nervous/anxious.   All other systems reviewed and are negative.        Objective:   Physical Exam  Constitutional: She is oriented to person, place, and time. She appears well-developed and well-nourished.  HENT:  Head: Normocephalic and atraumatic.  Neck: Normal range of motion. Neck supple.  Cardiovascular: Normal rate and regular rhythm.   Pulmonary/Chest: Effort normal and breath sounds normal.  Musculoskeletal:  Normal Muscle Bulk and Muscle Testing Reveals: Upper Extremities: Full ROM and Muscle Strength 5/5 Lower Extremities: Fu;ll ROM and Muscle Strength 5/5 Arises from chair with ease Narrow Based Gait  Neurological: She is alert and oriented to person, place, and time.  Skin: Skin is warm and dry.  Psychiatric: She has a normal mood and affect.  Nursing note and vitals reviewed.         Assessment & Plan:  1. 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 02/12/2017 2. Headaches: Refilled: Percocet 5/325 mg 0.5-one tablet every 8 hours As needed for severe painas needed #55. 02/12/2017 We will continue the opioid monitoring program, this consists of regular clinic visits, examinations, urine drug screen, pill counts as well as use of North  3. Mood: Continue Cymbalta. 02/12/2017 4. Left Knee Pain: Dr. Noemi Chapel Following. 02/12/2017  20 minutes of face to face patient care time was spent during this visit. All questions were encouraged and answered.   Follow up in 1 month

## 2017-02-15 ENCOUNTER — Encounter: Payer: Medicaid Other | Admitting: Registered Nurse

## 2017-03-11 ENCOUNTER — Telehealth: Payer: Self-pay | Admitting: Registered Nurse

## 2017-03-11 DIAGNOSIS — R519 Headache, unspecified: Secondary | ICD-10-CM

## 2017-03-11 DIAGNOSIS — R51 Headache: Principal | ICD-10-CM

## 2017-03-11 MED ORDER — OXYCODONE-ACETAMINOPHEN 5-325 MG PO TABS: 0.5000 | ORAL_TABLET | Freq: Three times a day (TID) | ORAL | 0 refills | Status: DC | PRN

## 2017-03-11 NOTE — Telephone Encounter (Signed)
Return [name] call, she states she has been using her Oxycodone on an average 2-3 times a day, NCCSR was reviewed She picked her Oxycodone up on 02/12/2017. I will print the Oxycodone prescription, she will be scheduled for an appointment on 02/12/2017. She was instructed to call office on 03/29/2017, we will began the tapering and switch her to Tramadol. [name] desires to be weaned off the opioids, this will be done with a slow tapering, she verbalizes understanding.

## 2017-03-11 NOTE — Telephone Encounter (Signed)
Error

## 2017-03-28 ENCOUNTER — Telehealth: Payer: Self-pay | Admitting: Registered Nurse

## 2017-03-28 MED ORDER — TRAMADOL HCL 50 MG PO TABS: 50.0000 mg | ORAL_TABLET | Freq: Three times a day (TID) | ORAL | 3 refills | Status: DC | PRN

## 2017-03-28 NOTE — Telephone Encounter (Signed)
Return [name] call she states she would like to weaned off the Oxycodone. [name] states she has 11 tablets of Oxycodone. Will prescribe Tramadol 50 mg TID, she will take  her Oxycodone  daily as needed. Instructed to call office on Monday 04/01/2017. She verbalizes understanding.

## 2017-03-29 ENCOUNTER — Telehealth: Payer: Self-pay | Admitting: *Deleted

## 2017-03-29 NOTE — Telephone Encounter (Signed)
Patient left a message asking for Danella Sensing, ANP to call back to discuss previous conversation about medication instructions.  She is having trouble recalling instructions given. Please call back

## 2017-03-29 NOTE — Telephone Encounter (Signed)
Return Ms. Brecheisen call, no answer, left message to return the call.

## 2017-04-04 ENCOUNTER — Telehealth: Payer: Self-pay | Admitting: Registered Nurse

## 2017-04-04 DIAGNOSIS — R51 Headache: Principal | ICD-10-CM

## 2017-04-04 DIAGNOSIS — R519 Headache, unspecified: Secondary | ICD-10-CM

## 2017-04-04 MED ORDER — OXYCODONE-ACETAMINOPHEN 5-325 MG PO TABS: 0.5000 | ORAL_TABLET | Freq: Two times a day (BID) | ORAL | 0 refills | Status: DC | PRN

## 2017-04-04 NOTE — Telephone Encounter (Signed)
Received a call from [name], she is taking her Tramadol 50 mg BID and Oxycodone 1/2 tablet in the morning and 1/2 tablet in the evening.  We will continue the weaning of Oxycodone, prescription printed 30 tablets will be dispensed. Instructed to make an appointment the week of August 6th, 2018, she verbalizes understanding.

## 2017-04-29 ENCOUNTER — Encounter: Payer: Medicaid Other | Attending: Physical Medicine & Rehabilitation | Admitting: Registered Nurse

## 2017-04-29 ENCOUNTER — Encounter: Payer: Self-pay | Admitting: Registered Nurse

## 2017-04-29 VITALS — BP 139/89 | HR 78

## 2017-04-29 DIAGNOSIS — S83206A Unspecified tear of unspecified meniscus, current injury, right knee, initial encounter: Secondary | ICD-10-CM | POA: Diagnosis not present

## 2017-04-29 DIAGNOSIS — R410 Disorientation, unspecified: Secondary | ICD-10-CM | POA: Diagnosis not present

## 2017-04-29 DIAGNOSIS — Z800 Family history of malignant neoplasm of digestive organs: Secondary | ICD-10-CM | POA: Diagnosis not present

## 2017-04-29 DIAGNOSIS — R42 Dizziness and giddiness: Secondary | ICD-10-CM | POA: Insufficient documentation

## 2017-04-29 DIAGNOSIS — Z811 Family history of alcohol abuse and dependence: Secondary | ICD-10-CM | POA: Insufficient documentation

## 2017-04-29 DIAGNOSIS — Z833 Family history of diabetes mellitus: Secondary | ICD-10-CM | POA: Insufficient documentation

## 2017-04-29 DIAGNOSIS — J45909 Unspecified asthma, uncomplicated: Secondary | ICD-10-CM | POA: Insufficient documentation

## 2017-04-29 DIAGNOSIS — R51 Headache: Secondary | ICD-10-CM

## 2017-04-29 DIAGNOSIS — Z79899 Other long term (current) drug therapy: Secondary | ICD-10-CM | POA: Diagnosis not present

## 2017-04-29 DIAGNOSIS — Z5181 Encounter for therapeutic drug level monitoring: Secondary | ICD-10-CM | POA: Diagnosis not present

## 2017-04-29 DIAGNOSIS — M069 Rheumatoid arthritis, unspecified: Secondary | ICD-10-CM | POA: Diagnosis not present

## 2017-04-29 DIAGNOSIS — G894 Chronic pain syndrome: Secondary | ICD-10-CM

## 2017-04-29 DIAGNOSIS — G060 Intracranial abscess and granuloma: Secondary | ICD-10-CM | POA: Diagnosis present

## 2017-04-29 DIAGNOSIS — G8929 Other chronic pain: Secondary | ICD-10-CM | POA: Insufficient documentation

## 2017-04-29 DIAGNOSIS — Z808 Family history of malignant neoplasm of other organs or systems: Secondary | ICD-10-CM | POA: Diagnosis not present

## 2017-04-29 DIAGNOSIS — R531 Weakness: Secondary | ICD-10-CM | POA: Insufficient documentation

## 2017-04-29 DIAGNOSIS — R519 Headache, unspecified: Secondary | ICD-10-CM

## 2017-04-29 MED ORDER — OXYCODONE-ACETAMINOPHEN 5-325 MG PO TABS: 0.5000 | ORAL_TABLET | Freq: Two times a day (BID) | ORAL | 0 refills | Status: DC | PRN

## 2017-04-29 NOTE — Progress Notes (Signed)
Subjective:    Patient ID: [name], female    DOB: 04-Apr-1966, 51 y.o.   MRN: [id number]  HPI:  [name] is a 51 year old female who returns for follow up appointmentfor chronicpain and medication refill. She states her pain is located in her head ( headache) occasionally headaches increase in intensity with stressful situations .She rates her pain 4.Her current exercise regime is walking.  We will continue with a slow tapering of Oxycodone as [name] requested.  Her last UDS 01/15/2017, inconsistent for Oxycodone.    Pain Inventory Average Pain 6 Pain Right Now 4 My pain is dull  In the last 24 hours, has pain interfered with the following? General activity 5 Relation with others 5 Enjoyment of life 5 What TIME of day is your pain at its worst? morning and evening Sleep (in general) Fair  Pain is worse with: inactivity and some activites Pain improves with: medication Relief from Meds: 8  Mobility walk without assistance how many minutes can you walk? 30 ability to climb steps?  yes do you drive?  yes  Function Do you have any goals in this area?  no  Neuro/Psych anxiety  Prior Studies Any changes since last visit?  no  Physicians involved in your care Any changes since last visit?  no   Family History  Problem Relation Age of Onset  . Cancer Mother        colon cancer  . Cancer Father        brain cancer  . Alcohol abuse Father   . Diabetes Father    Social History   Social History  . Marital status: Divorced    Spouse name: N/A  . Number of children: N/A  . Years of education: N/A   Social History Main Topics  . Smoking status: Never Smoker  . Smokeless tobacco: Never Used  . Alcohol use No  . Drug use: No  . Sexual activity: Not Currently   Other Topics Concern  . None   Social History Narrative  . None   Past Surgical History:  Procedure Laterality Date  . APPLICATION OF CRANIAL NAVIGATION Right 10/03/2015   Procedure: APPLICATION OF CRANIAL NAVIGATION;  Surgeon: Consuella Lose, MD;  Location: [place] [facility];  Service: Neurosurgery;  Laterality: Right;  . CESAREAN SECTION    . CRANIOTOMY Right 10/03/2015   Procedure: Stereotactic right frontoparietal craniotomy for excisional biopsy of lesion ;  Surgeon: Consuella Lose, MD;  Location: [place] [facility];  Service: Neurosurgery;  Laterality: Right;  . OOPHORECTOMY    . SHOULDER SURGERY    . TOOTH EXTRACTION N/A 10/07/2015   Procedure: Extraction of tooth #18 with alveoloplasty;  Surgeon: Lenn Cal, DDS;  Location: [place];  Service: Oral Surgery;  Laterality: N/A;  . TUBAL LIGATION     Past Medical History:  Diagnosis Date  . Asthma   . Chronic knee pain    right  . Meniscus tear   . Perimenopausal   . Rheumatoid arthritis ([place])   . Rotator cuff tear   . Slipped intervertebral disc    BP 139/89   Pulse 78   LMP 09/27/2011   SpO2 96%   Opioid Risk Score:   Fall Risk Score:  `1  Depression screen PHQ 2/9  Depression screen [facility] 2/9 12/04/2016 09/24/2016 05/14/2016 12/06/2015 11/10/2015  Decreased Interest 1 1 1 1  0  Down, Depressed, Hopeless 1 1 1 1  0  PHQ - 2 Score 2 2  2 2 0  Altered sleeping - - - 1 -  Tired, decreased energy - - - 1 -  Change in appetite - - - 0 -  Feeling bad or failure about yourself  - - - 0 -  Trouble concentrating - - - 1 -  Moving slowly or fidgety/restless - - - 1 -  Suicidal thoughts - - - 0 -  PHQ-9 Score - - - 6 -  Difficult doing work/chores - - - Somewhat difficult -   Review of Systems  Constitutional: Negative.   HENT: Negative.   Eyes: Negative.   Respiratory: Negative.   Cardiovascular: Negative.   Gastrointestinal: Negative.   Endocrine: Negative.   Genitourinary: Negative.   Musculoskeletal: Negative.   Skin: Negative.   Allergic/Immunologic: Negative.   Neurological: Negative.   Hematological: Negative.   Psychiatric/Behavioral: The patient is nervous/anxious.   All other systems  reviewed and are negative.      Objective:   Physical Exam  Constitutional: She is oriented to person, place, and time. She appears well-developed and well-nourished.  HENT:  Head: Normocephalic and atraumatic.  Neck: Normal range of motion. Neck supple.  Cardiovascular: Normal rate and regular rhythm.   Pulmonary/Chest: Effort normal and breath sounds normal.  Musculoskeletal:  Normal Muscle Bulk and Muscle Testing Reveals: Upper Extremities: Full ROM and Muscle Strength 5/5 Lower Extremities: Full ROM and Muscle Strength 5/5 Arises from chair with ease Narrow Based gait  Neurological: She is alert and oriented to person, place, and time.  Skin: Skin is warm and dry.  Psychiatric: She has a normal mood and affect.  Nursing note and vitals reviewed.         Assessment & Plan:  1. 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 04/29/2017 2. Headaches: Refilled: Percocet 5/325 mg 0.5-one tablet every 8 hours As needed for severe painas needed #45. 04/29/2017 We will continue the opioid monitoring program, this consists of regular clinic visits, examinations, urine drug screen, pill counts as well as use of North  3. Mood: Continue Cymbalta. 04/29/2017 4. Left Knee Pain: Dr. Noemi Chapel Following. 04/29/2017  20 minutes of face to face patient care time was spent during this visit. All questions were encouraged and answered. Follow up in 1 month

## 2017-04-30 ENCOUNTER — Telehealth: Payer: Self-pay | Admitting: Registered Nurse

## 2017-04-30 NOTE — Telephone Encounter (Signed)
On 04/30/2017 the  [place] was reviewed no conflict was seen on the [place] with multiple prescribers. [name] has a signed narcotic contract with our office. If there were any discrepancies this would have been reported to her physician.

## 2017-05-23 ENCOUNTER — Encounter: Payer: Self-pay | Admitting: Registered Nurse

## 2017-05-23 ENCOUNTER — Encounter: Payer: Medicaid Other | Attending: Physical Medicine & Rehabilitation | Admitting: Registered Nurse

## 2017-05-23 VITALS — BP 139/83 | HR 86

## 2017-05-23 DIAGNOSIS — R519 Headache, unspecified: Secondary | ICD-10-CM

## 2017-05-23 DIAGNOSIS — R410 Disorientation, unspecified: Secondary | ICD-10-CM | POA: Insufficient documentation

## 2017-05-23 DIAGNOSIS — M069 Rheumatoid arthritis, unspecified: Secondary | ICD-10-CM | POA: Insufficient documentation

## 2017-05-23 DIAGNOSIS — R42 Dizziness and giddiness: Secondary | ICD-10-CM | POA: Insufficient documentation

## 2017-05-23 DIAGNOSIS — Z5181 Encounter for therapeutic drug level monitoring: Secondary | ICD-10-CM | POA: Diagnosis not present

## 2017-05-23 DIAGNOSIS — J45909 Unspecified asthma, uncomplicated: Secondary | ICD-10-CM | POA: Insufficient documentation

## 2017-05-23 DIAGNOSIS — G8194 Hemiplegia, unspecified affecting left nondominant side: Secondary | ICD-10-CM

## 2017-05-23 DIAGNOSIS — Z800 Family history of malignant neoplasm of digestive organs: Secondary | ICD-10-CM | POA: Diagnosis not present

## 2017-05-23 DIAGNOSIS — G060 Intracranial abscess and granuloma: Secondary | ICD-10-CM

## 2017-05-23 DIAGNOSIS — Z808 Family history of malignant neoplasm of other organs or systems: Secondary | ICD-10-CM | POA: Insufficient documentation

## 2017-05-23 DIAGNOSIS — G939 Disorder of brain, unspecified: Secondary | ICD-10-CM | POA: Diagnosis not present

## 2017-05-23 DIAGNOSIS — G8929 Other chronic pain: Secondary | ICD-10-CM | POA: Diagnosis present

## 2017-05-23 DIAGNOSIS — R51 Headache: Secondary | ICD-10-CM | POA: Diagnosis not present

## 2017-05-23 DIAGNOSIS — Z79899 Other long term (current) drug therapy: Secondary | ICD-10-CM | POA: Diagnosis not present

## 2017-05-23 DIAGNOSIS — Z833 Family history of diabetes mellitus: Secondary | ICD-10-CM | POA: Insufficient documentation

## 2017-05-23 DIAGNOSIS — G894 Chronic pain syndrome: Secondary | ICD-10-CM | POA: Diagnosis not present

## 2017-05-23 DIAGNOSIS — S83206A Unspecified tear of unspecified meniscus, current injury, right knee, initial encounter: Secondary | ICD-10-CM | POA: Diagnosis not present

## 2017-05-23 DIAGNOSIS — R531 Weakness: Secondary | ICD-10-CM | POA: Insufficient documentation

## 2017-05-23 DIAGNOSIS — Z811 Family history of alcohol abuse and dependence: Secondary | ICD-10-CM | POA: Insufficient documentation

## 2017-05-23 MED ORDER — OXYCODONE-ACETAMINOPHEN 5-325 MG PO TABS
0.5000 | ORAL_TABLET | Freq: Two times a day (BID) | ORAL | 0 refills | Status: DC | PRN
Start: 2017-05-23 — End: 2017-06-18

## 2017-05-23 MED ORDER — GABAPENTIN 100 MG PO CAPS: 100.0000 mg | ORAL_CAPSULE | Freq: Three times a day (TID) | ORAL | 3 refills | Status: DC

## 2017-05-23 NOTE — Progress Notes (Signed)
Subjective:    Patient ID: [name], female    DOB: [birth year], 51 y.o.   MRN: [id number]  HPI: [name] is a 51 year old female who returns for follow up appointmentfor chronicpain and medication refill. She states her pain is located in her head ( headache) occasionally headaches increase in intensity with stressful situations .She rates her pain 7.Her current exercise regime is walking.  [name] has increased intensity of pain for the last few weeks and she has been experiencing withdrawal symptoms with the slow tapering of Oxycodone. Also states she has been under stress with her son's returning to school and has been experiencing increase intensity of pain and frequency of headaches. We will increase her Oxycodone tablets and instructions was given as it relates to analgesics, she verbalizes understanding.   Her last UDS 01/15/2017, inconsistent for Oxycodone.    Pain Inventory Average Pain 7 Pain Right Now 7 My pain is dull  In the last 24 hours, has pain interfered with the following? General activity 7 Relation with others 8 Enjoyment of life 8 What TIME of day is your pain at its worst? morning and night Sleep (in general) Poor  Pain is worse with: sitting Pain improves with: medication Relief from Meds: 5  Mobility ability to climb steps?  yes do you drive?  yes  Function Do you have any goals in this area?  no  Neuro/Psych depression anxiety  Prior Studies Any changes since last visit?  no  Physicians involved in your care Any changes since last visit?  no   Family History  Problem Relation Age of Onset  . Cancer Mother        colon cancer  . Cancer Father        brain cancer  . Alcohol abuse Father   . Diabetes Father    Social History   Social History  . Marital status: Divorced    Spouse name: N/A  . Number of children: N/A  . Years of education: N/A   Social History Main Topics  . Smoking status: Never Smoker  .  Smokeless tobacco: Never Used  . Alcohol use No  . Drug use: No  . Sexual activity: Not Currently   Other Topics Concern  . None   Social History Narrative  . None   Past Surgical History:  Procedure Laterality Date  . APPLICATION OF CRANIAL NAVIGATION Right 10/03/2015   Procedure: APPLICATION OF CRANIAL NAVIGATION;  Surgeon: Teresa Lose, MD;  Location: [facility];  Service: Neurosurgery;  Laterality: Right;  . CESAREAN SECTION    . CRANIOTOMY Right 10/03/2015   Procedure: Stereotactic right frontoparietal craniotomy for excisional biopsy of lesion ;  Surgeon: Teresa Lose, MD;  Location: [facility];  Service: Neurosurgery;  Laterality: Right;  . OOPHORECTOMY    . SHOULDER SURGERY    . TOOTH EXTRACTION N/A 10/07/2015   Procedure: Extraction of tooth #18 with alveoloplasty;  Surgeon: [name], DDS;  Location: [facility];  Service: Oral Surgery;  Laterality: N/A;  . TUBAL LIGATION     Past Medical History:  Diagnosis Date  . Asthma   . Chronic knee pain    right  . Meniscus tear   . Perimenopausal   . Rheumatoid arthritis ([place])   . Rotator cuff tear   . Slipped intervertebral disc    BP 139/83   Pulse 86   LMP 09/27/2011   SpO2 96%   Opioid Risk Score:  2  Fall Risk Score:  `1  Depression screen PHQ 2/9  Depression screen [facility] 2/9 05/23/2017 12/04/2016 09/24/2016 05/14/2016 12/06/2015 11/10/2015  Decreased Interest 3 1 1 1 1  0  Down, Depressed, Hopeless 3 1 1 1 1  0  PHQ - 2 Score 6 2 2 2 2  0  Altered sleeping - - - - 1 -  Tired, decreased energy - - - - 1 -  Change in appetite - - - - 0 -  Feeling bad or failure about yourself  - - - - 0 -  Trouble concentrating - - - - 1 -  Moving slowly or fidgety/restless - - - - 1 -  Suicidal thoughts - - - - 0 -  PHQ-9 Score - - - - 6 -  Difficult doing work/chores - - - - Somewhat difficult -    Review of Systems  Constitutional: Negative.   HENT: Negative.   Eyes: Negative.   Respiratory: Negative.     Cardiovascular: Negative.   Gastrointestinal: Negative.   Endocrine: Negative.   Genitourinary: Negative.   Musculoskeletal: Negative.   Allergic/Immunologic: Negative.   Neurological: Negative.   Hematological: Negative.   Psychiatric/Behavioral: Positive for dysphoric mood. The patient is nervous/anxious.   All other systems reviewed and are negative.      Objective:   Physical Exam  Constitutional: She is oriented to person, place, and time. She appears well-developed and well-nourished.  HENT:  Head: Normocephalic and atraumatic.  Neck: Normal range of motion. Neck supple.  Cardiovascular: Normal rate and regular rhythm.   Pulmonary/Chest: Effort normal and breath sounds normal.  Musculoskeletal:  Normal Muscle Bulk and Muscle Testing Reveals: Upper Extremities: Full ROM and Muscle Strength 5/5 Lower Extremities: Full ROM and Muscle Strength 5/5 Arises from chair with ease Narrow Based gait    Neurological: She is alert and oriented to person, place, and time.  Skin: Skin is warm and dry.  Psychiatric: She has a normal mood and affect.  Nursing note and vitals reviewed.         Assessment & Plan:  1. 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 05/23/2017 2. Headaches: Refilled: Percocet 5/325 mg 0.5-one tablet every 8 hours As needed for severe painas needed #55. Resume the weaning Process and continue Tramadol.  05/23/2017 We will continue the opioid monitoring program, this consists of regular clinic visits, examinations, urine drug screen, pill counts as well as use of North  3. Chronic Pain Syndrome: Continue Gabapentin . 05/23/2017 4. Left Knee Pain: No complaints today.Dr. Noemi Chapel Following. 05/23/2017  30 minutes of face to face patient care time was spent during this visit. All questions were encouraged and answered.  Follow up in 1 month

## 2017-05-27 ENCOUNTER — Encounter: Payer: Medicaid Other | Admitting: Registered Nurse

## 2017-06-18 ENCOUNTER — Encounter: Payer: Self-pay | Admitting: Registered Nurse

## 2017-06-18 ENCOUNTER — Telehealth: Payer: Self-pay | Admitting: *Deleted

## 2017-06-18 ENCOUNTER — Encounter: Payer: Medicaid Other | Attending: Physical Medicine & Rehabilitation | Admitting: Registered Nurse

## 2017-06-18 VITALS — BP 124/90 | HR 75

## 2017-06-18 DIAGNOSIS — M7582 Other shoulder lesions, left shoulder: Secondary | ICD-10-CM

## 2017-06-18 DIAGNOSIS — R410 Disorientation, unspecified: Secondary | ICD-10-CM | POA: Diagnosis not present

## 2017-06-18 DIAGNOSIS — S83206A Unspecified tear of unspecified meniscus, current injury, right knee, initial encounter: Secondary | ICD-10-CM | POA: Insufficient documentation

## 2017-06-18 DIAGNOSIS — Z833 Family history of diabetes mellitus: Secondary | ICD-10-CM | POA: Diagnosis not present

## 2017-06-18 DIAGNOSIS — G894 Chronic pain syndrome: Secondary | ICD-10-CM | POA: Diagnosis not present

## 2017-06-18 DIAGNOSIS — R42 Dizziness and giddiness: Secondary | ICD-10-CM | POA: Diagnosis not present

## 2017-06-18 DIAGNOSIS — G8929 Other chronic pain: Secondary | ICD-10-CM | POA: Insufficient documentation

## 2017-06-18 DIAGNOSIS — M069 Rheumatoid arthritis, unspecified: Secondary | ICD-10-CM | POA: Diagnosis not present

## 2017-06-18 DIAGNOSIS — Z811 Family history of alcohol abuse and dependence: Secondary | ICD-10-CM | POA: Diagnosis not present

## 2017-06-18 DIAGNOSIS — M7581 Other shoulder lesions, right shoulder: Secondary | ICD-10-CM

## 2017-06-18 DIAGNOSIS — Z5181 Encounter for therapeutic drug level monitoring: Secondary | ICD-10-CM | POA: Diagnosis not present

## 2017-06-18 DIAGNOSIS — Z808 Family history of malignant neoplasm of other organs or systems: Secondary | ICD-10-CM | POA: Insufficient documentation

## 2017-06-18 DIAGNOSIS — G060 Intracranial abscess and granuloma: Secondary | ICD-10-CM | POA: Diagnosis not present

## 2017-06-18 DIAGNOSIS — R531 Weakness: Secondary | ICD-10-CM | POA: Insufficient documentation

## 2017-06-18 DIAGNOSIS — M778 Other enthesopathies, not elsewhere classified: Secondary | ICD-10-CM

## 2017-06-18 DIAGNOSIS — R51 Headache: Secondary | ICD-10-CM

## 2017-06-18 DIAGNOSIS — Z800 Family history of malignant neoplasm of digestive organs: Secondary | ICD-10-CM | POA: Diagnosis not present

## 2017-06-18 DIAGNOSIS — J45909 Unspecified asthma, uncomplicated: Secondary | ICD-10-CM | POA: Diagnosis not present

## 2017-06-18 DIAGNOSIS — R519 Headache, unspecified: Secondary | ICD-10-CM

## 2017-06-18 DIAGNOSIS — Z79899 Other long term (current) drug therapy: Secondary | ICD-10-CM

## 2017-06-18 MED ORDER — METHYLPREDNISOLONE 4 MG PO TBPK: ORAL_TABLET | ORAL | 0 refills | Status: DC

## 2017-06-18 MED ORDER — OXYCODONE-ACETAMINOPHEN 5-325 MG PO TABS: 0.5000 | ORAL_TABLET | Freq: Two times a day (BID) | ORAL | 0 refills | Status: DC | PRN

## 2017-06-18 NOTE — Telephone Encounter (Signed)
I [name] spoke with [name] the pharmacist, we are in the process of slowly trying to titrate [name] Oxycodone and initializing Tramadol. 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 There are days when she reports increase intensity of pain and the Tramadol is ineffective to her pain. I discuss with [name]  different schedule times as it relates to her Oxycodone and Tramadol she verbalizes understanding.  [name] the pharmacist aware of the above and all questions were answered.

## 2017-06-18 NOTE — Progress Notes (Signed)
Subjective:    Patient ID: [name], female    DOB: June 01, 1966, 51 y.o.   MRN: [id number]  HPI: [name] is a 51 year old female who returns for follow up appointmentfor chronicpain and medication refill. She states her pain is located in her head ( headache) occasionally headaches, she reports increase frequency of headaches with stressful situations .She rates her pain 8.Her current exercise regime is walking 30-45 minutes daily and attending the [facility] three times a week for water aerobics three times a week.   Ms. Teresa pain fluctuates in intensity with her various family stressors, we discuss counseling, she will think about it. At this time we will continue the oxycodone at this time, and Ms. Costa was encouraged to use Tramadol as ordered, discuss the scheduled treatment times she verbalizes understanding.   Ms. Foutz Morphine equivalent is  30 MME.    Her last UDS 01/15/2017, inconsistent for Oxycodone.    Pain Inventory Average Pain 8 Pain Right Now 8 My pain is aching  In the last 24 hours, has pain interfered with the following? General activity 7 Relation with others 7 Enjoyment of life 8 What TIME of day is your pain at its worst? morning and night Sleep (in general) Fair  Pain is worse with: sitting and some activites Pain improves with: rest, heat/ice and medication Relief from Meds: 7  Mobility walk without assistance ability to climb steps?  yes do you drive?  yes  Function Do you have any goals in this area?  no  Neuro/Psych depression anxiety  Prior Studies Any changes since last visit?  no  Physicians involved in your care Any changes since last visit?  no   Family History  Problem Relation Age of Onset  . Cancer Mother        colon cancer  . Cancer Father        brain cancer  . Alcohol abuse Father   . Diabetes Father    Social History   Social History  . Marital status: Divorced    Spouse name: N/A  . Number of  children: N/A  . Years of education: N/A   Social History Main Topics  . Smoking status: Never Smoker  . Smokeless tobacco: Never Used  . Alcohol use No  . Drug use: No  . Sexual activity: Not Currently   Other Topics Concern  . Not on file   Social History Narrative  . No narrative on file   Past Surgical History:  Procedure Laterality Date  . APPLICATION OF CRANIAL NAVIGATION Right 10/03/2015   Procedure: APPLICATION OF CRANIAL NAVIGATION;  Surgeon: Consuella Lose, MD;  Location: [facility];  Service: Neurosurgery;  Laterality: Right;  . CESAREAN SECTION    . CRANIOTOMY Right 10/03/2015   Procedure: Stereotactic right frontoparietal craniotomy for excisional biopsy of lesion ;  Surgeon: Consuella Lose, MD;  Location: [facility];  Service: Neurosurgery;  Laterality: Right;  . OOPHORECTOMY    . SHOULDER SURGERY    . TOOTH EXTRACTION N/A 10/07/2015   Procedure: Extraction of tooth #18 with alveoloplasty;  Surgeon: Lenn Cal, DDS;  Location: [facility];  Service: Oral Surgery;  Laterality: N/A;  . TUBAL LIGATION     Past Medical History:  Diagnosis Date  . Asthma   . Chronic knee pain    right  . Meniscus tear   . Perimenopausal   . Rheumatoid arthritis ([place])   . Rotator cuff tear   .  Slipped intervertebral disc    BP 124/90   Pulse 75   LMP 09/27/2011   SpO2 96%   Opioid Risk Score:  2 Fall Risk Score:  `1  Depression screen PHQ 2/9  Depression screen [facility] 2/9 06/18/2017 05/23/2017 12/04/2016 09/24/2016 05/14/2016 12/06/2015 11/10/2015  Decreased Interest 0 3 1 1 1 1  0  Down, Depressed, Hopeless 0 3 1 1 1 1  0  PHQ - 2 Score 0 6 2 2 2 2  0  Altered sleeping - - - - - 1 -  Tired, decreased energy - - - - - 1 -  Change in appetite - - - - - 0 -  Feeling bad or failure about yourself  - - - - - 0 -  Trouble concentrating - - - - - 1 -  Moving slowly or fidgety/restless - - - - - 1 -  Suicidal thoughts - - - - - 0 -  PHQ-9 Score - - - - - 6 -  Difficult doing  work/chores - - - - - Somewhat difficult -    Review of Systems  Constitutional: Negative.   HENT: Negative.   Eyes: Negative.   Respiratory: Negative.   Cardiovascular: Negative.   Gastrointestinal: Negative.   Endocrine: Negative.   Genitourinary: Negative.   Musculoskeletal: Negative.   Allergic/Immunologic: Negative.   Neurological: Negative.   Hematological: Negative.   Psychiatric/Behavioral: Positive for dysphoric mood. The patient is nervous/anxious.   All other systems reviewed and are negative.      Objective:   Physical Exam  Constitutional: She is oriented to person, place, and time. She appears well-developed and well-nourished.  HENT:  Head: Normocephalic and atraumatic.  Neck: Normal range of motion. Neck supple.  Cardiovascular: Normal rate and regular rhythm.   Pulmonary/Chest: Effort normal and breath sounds normal.  Musculoskeletal:  Normal Muscle Bulk and Muscle Testing Reveals: Upper Extremities: Full  ROM and Muscle Strength 5/5 Thoracic Paraspinal Tenderness: T-1-T-3 Lower Extremities: Full ROM and Muscle Strength 5/5 Arises from chair with ease Narrow Based Gait    Neurological: She is alert and oriented to person, place, and time.  Skin: Skin is warm.  Psychiatric: She has a normal mood and affect.  Nursing note and vitals reviewed.         Assessment & Plan:  1. 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 06/18/2017 2. Headaches: Refilled: Percocet 5/325 mg 0.5-one tablet every 8 hours As needed for severe painas needed #60. Discuss with Ms. Eimer we will resume the slowly weaning of oxycodone in two weeks and continue Tramadol. 06/18/2017 We will continue the opioid monitoring program, this consists of regular clinic visits, examinations, urine drug screen, pill counts as well as use of [place]  3. Chronic Pain Syndrome: Continue Gabapentin . 06/18/2017 4. Left Knee Pain: No complaints  today.Dr. Noemi Chapel Following. 06/18/2017  40 minutes of face to face patient care time was spent during this visit. All questions were encouraged and answered.  Follow up in 1 month

## 2017-06-18 NOTE — Telephone Encounter (Signed)
Pharmacist requesting peer to peer with Danella Sensing regarding patients pain medication.  She is concerned about patients pain medications, oxycodone and tramadol. She is concerned that patient has a surplus. She wants to get on the same page

## 2017-06-27 ENCOUNTER — Telehealth: Payer: Self-pay | Admitting: *Deleted

## 2017-06-27 NOTE — Telephone Encounter (Signed)
Patient is medicaid, patients need prior authorization through [facility] for tramadol

## 2017-06-28 ENCOUNTER — Other Ambulatory Visit: Payer: Self-pay | Admitting: *Deleted

## 2017-06-28 NOTE — Telephone Encounter (Signed)
Prior auth submitted for tramadol to [place] [employer].

## 2017-06-29 ENCOUNTER — Encounter (HOSPITAL_COMMUNITY): Payer: Self-pay | Admitting: Emergency Medicine

## 2017-06-29 ENCOUNTER — Ambulatory Visit (HOSPITAL_COMMUNITY)
Admission: EM | Admit: 2017-06-29 | Discharge: 2017-06-29 | Disposition: A | Discharge status: Home / Self Care | Payer: Medicaid Other | Attending: Internal Medicine | Admitting: Internal Medicine

## 2017-06-29 DIAGNOSIS — L0211 Cutaneous abscess of neck: Secondary | ICD-10-CM | POA: Diagnosis not present

## 2017-06-29 MED ORDER — SULFAMETHOXAZOLE-TRIMETHOPRIM 800-160 MG PO TABS: 1.0000 | ORAL_TABLET | Freq: Two times a day (BID) | ORAL | 0 refills | Status: AC

## 2017-06-29 NOTE — Discharge Instructions (Signed)
Recommend start Bactrim twice a day as directed. May apply warm compresses to area 3 times a day as needed. May take Tramadol as needed for pain. Follow-up with your PCP in 2 days if not improving.

## 2017-06-29 NOTE — ED Provider Notes (Signed)
[place]    CSN: [id number] Arrival date & time: 06/29/17  1627     History   Chief Complaint Chief Complaint  Patient presents with  . Abscess    HPI [name] is a 51 y.o. female.   51 year old female presents with possible abscess on right side of neck that has been present for the past 2 to 3 days. Getting larger and more painful. Was starting to drain yellow, smelly discharge yesterday. Has been applying warm compresses but unable to get out more discharge today and becoming more painful. Denies any fever, chills, decreased range of motion of neck, dizziness or numbness. Has also noticed soreness at base of posterior neck. Uncertain if muscle pain or developing another abscess. 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 Has daily headaches that are controlled some with Neurontin, Oxycodone and Tramadol. May be allergic to Voltaren orally (itching) but can use Voltaren gel. Other chronic health issues include Diabetes and asthma- currently on Metformin and takes Albuterol as needed.    The history is provided by the patient.    Past Medical History:  Diagnosis Date  . Asthma   . Chronic knee pain    right  . Meniscus tear   . Perimenopausal   . Rheumatoid arthritis ([place])   . Rotator cuff tear   . Slipped intervertebral disc     Patient Active Problem List   Diagnosis Date Noted  . Severe frontal headaches 12/06/2015  . Left hemiparesis ([place]) 10/07/2015  . Abscessed tooth 10/07/2015  . Asthma   . Acute blood loss anemia   . 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 Brain lesion 09/29/2015  . TIA (transient ischemic attack) 09/28/2015  . Headache 09/28/2015  . Adjustment disorder with mixed anxiety and depressed mood 07/02/2013  . Depressive disorder 07/03/2012    Past Surgical History:  Procedure Laterality Date  . APPLICATION OF CRANIAL NAVIGATION Right 10/03/2015   Procedure: APPLICATION OF  CRANIAL NAVIGATION;  Surgeon: Consuella Lose, MD;  Location: [place] [facility];  Service: Neurosurgery;  Laterality: Right;  . CESAREAN SECTION    . CRANIOTOMY Right 10/03/2015   Procedure: Stereotactic right frontoparietal craniotomy for excisional biopsy of lesion ;  Surgeon: Consuella Lose, MD;  Location: [place] [facility];  Service: Neurosurgery;  Laterality: Right;  . OOPHORECTOMY    . SHOULDER SURGERY    . TOOTH EXTRACTION N/A 10/07/2015   Procedure: Extraction of tooth #18 with alveoloplasty;  Surgeon: Lenn Cal, DDS;  Location: [facility];  Service: Oral Surgery;  Laterality: N/A;  . TUBAL LIGATION      OB History    No data available       Home Medications    Prior to Admission medications   Medication Sig Start Date End Date Taking? Authorizing Provider  albuterol (PROVENTIL HFA;VENTOLIN HFA) 108 (90 Base) MCG/ACT inhaler Inhale 2 puffs into the lungs every 6 (six) hours as needed for wheezing or shortness of breath. For wheezing 10/14/15  Yes Angiulli, Lavon Paganini, PA-C  gabapentin (NEURONTIN) 100 MG capsule Take 1-2 capsules (100-200 mg total) by mouth 3 (three) times daily. 05/23/17  Yes Bayard Hugger, NP  metFORMIN (GLUCOPHAGE) 500 MG tablet Take half tablet twice a day 09/24/16  Yes Bayard Hugger, NP  ACCU-CHEK AVIVA PLUS test strip USE AS DIRECTED TO TEST BLOOD SUGARS 4 TIMES A DAY 07/09/16   [provider]  cholecalciferol (VITAMIN D)  1000 UNITS tablet Take 1,000 Units by mouth daily with breakfast.    [provider]  hydrochlorothiazide (HYDRODIURIL) 25 MG tablet Take 25 mg by mouth daily. 03/08/16   [provider]  Multiple Vitamin (MULTIVITAMIN WITH MINERALS) TABS tablet Take 1 tablet by mouth daily.    [provider]  oxyCODONE-acetaminophen (PERCOCET) 5-325 MG tablet Take 0.5-1 tablets by mouth 2 (two) times daily as needed for severe pain. 06/18/17   Bayard Hugger, NP  sulfamethoxazole-trimethoprim (BACTRIM DS,SEPTRA DS) 800-160  MG tablet Take 1 tablet by mouth 2 (two) times daily. 06/29/17 07/09/17  Katy Apo, NP  traMADol (ULTRAM) 50 MG tablet Take 1 tablet (50 mg total) by mouth every 8 (eight) hours as needed. 03/28/17   Bayard Hugger, NP  VOLTAREN 1 % GEL Apply 1 application topically 4 (four) times daily as needed. 11/09/16   [provider]    Family History Family History  Problem Relation Age of Onset  . Cancer Mother        colon cancer  . Cancer Father        brain cancer  . Alcohol abuse Father   . Diabetes Father     Social History Social History  Substance Use Topics  . Smoking status: Never Smoker  . Smokeless tobacco: Never Used  . Alcohol use No     Allergies   Diclofenac sodium   Review of Systems Review of Systems  Constitutional: Negative for activity change, appetite change, chills, fatigue and fever.  HENT: Negative for ear discharge, ear pain and facial swelling.   Respiratory: Negative for cough, chest tightness, shortness of breath and wheezing.   Gastrointestinal: Negative for diarrhea, nausea and vomiting.  Musculoskeletal: Positive for arthralgias, back pain, myalgias and neck pain. Negative for neck stiffness.  Skin: Positive for color change and wound. Negative for rash.  Neurological: Positive for headaches. Negative for dizziness, tremors, seizures, syncope, facial asymmetry, speech difficulty, weakness, light-headedness and numbness.  Hematological: Negative for adenopathy. Does not bruise/bleed easily.  Psychiatric/Behavioral: Negative.      Physical Exam Triage Vital Signs ED Triage Vitals  Enc Vitals Group     BP 06/29/17 1640 140/64     Pulse Rate 06/29/17 1640 89     Resp 06/29/17 1640 20     Temp 06/29/17 1640 98.7 F (37.1 C)     Temp Source 06/29/17 1640 Oral     SpO2 06/29/17 1640 100 %     Weight --      Height --      Head Circumference --      Peak Flow --      Pain Score 06/29/17 1641 7     Pain Loc --      Pain Edu? --       Excl. in [place]? --    No data found.   Updated Vital Signs BP 140/64 (BP Location: Left Arm)   Pulse 89   Temp 98.7 F (37.1 C) (Oral)   Resp 20   LMP 09/27/2011   SpO2 100%   Visual Acuity Right Eye Distance:   Left Eye Distance:   Bilateral Distance:    Right Eye Near:   Left Eye Near:    Bilateral Near:     Physical Exam  Constitutional: She is oriented to person, place, and time. She appears well-developed and well-nourished. No distress.  HENT:  Head: Normocephalic and atraumatic.  Right Ear: Hearing, tympanic membrane, external ear and ear  canal normal.  Left Ear: Hearing, tympanic membrane, external ear and ear canal normal.  Nose: Nose normal.  Mouth/Throat: Uvula is midline, oropharynx is clear and moist and mucous membranes are normal.  Eyes: Conjunctivae and EOM are normal.  Neck: Trachea normal and normal range of motion. Neck supple. No JVD present. Muscular tenderness present. Carotid bruit is not present. No neck rigidity. Edema and erythema present.    Oval 2cm by 1cm abscess present on right anterior aspect of neck just above clavicular nodes (which are non-palpable). Is closed and currently not draining any fluid. Very tender and hard at edges but more soft in the middle. Slight surrounding erythema and warmth. Has full range of motion of neck but pain at site of abscess with rotation.  Also tender and swollen mid-line around 5th cervical vertebrae. No distinct abscess present. No redness or warmth. Has full range of motion of neck but pain in this area with flexion.   Cardiovascular: Normal rate.   Pulmonary/Chest: Effort normal.  Musculoskeletal: Normal range of motion. She exhibits tenderness.  Lymphadenopathy:    She has no cervical adenopathy.  Neurological: She is alert and oriented to person, place, and time. She has normal strength. No cranial nerve deficit or sensory deficit.  Skin: Skin is warm and dry. Capillary refill takes less than 2  seconds. No rash noted. There is erythema.  Psychiatric: She has a normal mood and affect. Her behavior is normal. Judgment and thought content normal.     UC Treatments / Results  Labs (all labs ordered are listed, but only abnormal results are displayed) Labs Reviewed - No data to display  EKG  EKG Interpretation None       Radiology No results found.  Procedures .Marland KitchenIncision and Drainage Date/Time: 06/29/2017 6:14 PM Performed by: Melvyn Neth, ANN BERRY Authorized by: Sherlene Shams   Consent:    Consent obtained:  Verbal   Consent given by:  Patient   Risks discussed:  Bleeding, incomplete drainage and pain   Alternatives discussed:  Delayed treatment, alternative treatment, referral and observation Location:    Type:  Abscess   Size:  2cm by 1cm   Location:  Neck   Neck location:  R anterior Pre-procedure details:    Skin preparation:  Betadine Anesthesia (see MAR for exact dosages):    Anesthesia method:  Topical application   Topical anesthesia: cooling spray. Procedure type:    Complexity:  Simple Procedure details:    Needle aspiration: no     Incision types:  Stab incision   Incision depth:  Dermal   Scalpel blade:  11   Drainage:  Bloody and purulent   Drainage amount:  Moderate   Wound treatment:  Wound left open   Packing materials:  None Post-procedure details:    Patient tolerance of procedure:  Tolerated well, no immediate complications Comments:     Mostly blood discharge with some yellowish pus. Unable to obtain a good specimen to send for wound culture. Did not probe more deeply due to location of abscess. Patient tolerated procedure well. Applied bacitracin ointment and covered with gauze.      (including critical care time)  Medications Ordered in UC Medications - No data to display   Initial Impression / Assessment and Plan / UC Course  I have reviewed the triage vital signs and the nursing notes.  Pertinent labs & imaging results  that were available during my care of the patient were reviewed by me and considered in  my medical decision making (see chart for details).    Discussed that unable to completely drain all discharge from abscess. Encouraged to continue warm compresses to area to help with drainage and with comfort. Start Bactrim DS twice a day as directed. Discussed that sore area in posterior neck appears to be muscle tenderness but continue to monitor. May take Tramadol or Oxycodone as needed for pain since unable to take NSAIDs. Recommend follow-up with her PCP in 2 days for recheck or go to ER if pain, swelling increases or any fever develops.   Final Clinical Impressions(s) / UC Diagnoses   Final diagnoses:  Abscess of skin of neck    New Prescriptions Discharge Medication List as of 06/29/2017  6:15 PM    START taking these medications   Details  sulfamethoxazole-trimethoprim (BACTRIM DS,SEPTRA DS) 800-160 MG tablet Take 1 tablet by mouth 2 (two) times daily., Starting Sat 06/29/2017, Until Tue 07/09/2017, Normal         Controlled Substance Prescriptions [place] Controlled Substance Registry consulted? Yes, I have consulted the [place] Controlled Substances Registry for this patient. She just received #60 of Oxycodone on 06/18/17 and indicates that this medication is not very helpful for her pain, although she fills it every month. She was requesting more Tramadol since she states she only has 2 pills left. We informed her that according to the [facility] records, she filled her Tramadol #90 on 05/28/17 and has 2 refills remaining. Recommend contact her PCP if additional pain medication is needed or if unable to get refills from pharmacy.    Katy Apo, NP 06/30/17 [phone]

## 2017-06-29 NOTE — ED Triage Notes (Signed)
Pt reports abscess on anterior of neck onset 2-3 days associated w/pain, pus  Has been applying warm compressions  Denies fevers, chills  A&O x4... NAD... Ambulatory

## 2017-06-29 NOTE — ED Notes (Addendum)
Patient's discharge delayed due to patient's questions related to tramadol use for pain.  Patient said she had 2 pills left.  Provider was in a separate treatment room.  Notified provider, reviewed registry.  Patient has 2 refills left on current tramadol script, this nurse notified patient that she does have tramadol refills. Patient said she would pick them up and agreeable.  Patient discharged by this nurse.

## 2017-07-01 ENCOUNTER — Emergency Department (HOSPITAL_COMMUNITY): Payer: Medicaid Other

## 2017-07-01 ENCOUNTER — Encounter (HOSPITAL_COMMUNITY): Payer: Self-pay | Admitting: Emergency Medicine

## 2017-07-01 ENCOUNTER — Emergency Department (HOSPITAL_COMMUNITY)
Admission: EM | Admit: 2017-07-01 | Discharge: 2017-07-01 | Disposition: A | Discharge status: Home / Self Care | Payer: Medicaid Other | Attending: Emergency Medicine | Admitting: Emergency Medicine

## 2017-07-01 DIAGNOSIS — L03221 Cellulitis of neck: Secondary | ICD-10-CM | POA: Insufficient documentation

## 2017-07-01 DIAGNOSIS — J45909 Unspecified asthma, uncomplicated: Secondary | ICD-10-CM | POA: Insufficient documentation

## 2017-07-01 DIAGNOSIS — L0291 Cutaneous abscess, unspecified: Secondary | ICD-10-CM

## 2017-07-01 DIAGNOSIS — R221 Localized swelling, mass and lump, neck: Secondary | ICD-10-CM

## 2017-07-01 HISTORY — DX: Cerebral infarction, unspecified: I63.9

## 2017-07-01 LAB — CBC WITH DIFFERENTIAL/PLATELET
BASOS ABS: 0 10*3/uL (ref 0.0–0.1)
BASOS PCT: 0 %
EOS ABS: 0.1 10*3/uL (ref 0.0–0.7)
EOS PCT: 2 %
HEMATOCRIT: 36.7 % (ref 36.0–46.0)
Hemoglobin: 12.9 g/dL (ref 12.0–15.0)
Lymphocytes Relative: 45 %
Lymphs Abs: 2.5 10*3/uL (ref 0.7–4.0)
MCH: 33.3 pg (ref 26.0–34.0)
MCHC: 35.1 g/dL (ref 30.0–36.0)
MCV: 94.8 fL (ref 78.0–100.0)
MONO ABS: 0.4 10*3/uL (ref 0.1–1.0)
Monocytes Relative: 8 %
NEUTROS ABS: 2.5 10*3/uL (ref 1.7–7.7)
Neutrophils Relative %: 45 %
PLATELETS: 280 10*3/uL (ref 150–400)
RBC: 3.87 MIL/uL (ref 3.87–5.11)
RDW: 12.2 % (ref 11.5–15.5)
WBC: 5.5 10*3/uL (ref 4.0–10.5)

## 2017-07-01 LAB — BASIC METABOLIC PANEL
ANION GAP: 9 (ref 5–15)
BUN: 10 mg/dL (ref 6–20)
CALCIUM: 9 mg/dL (ref 8.9–10.3)
CO2: 26 mmol/L (ref 22–32)
Chloride: 103 mmol/L (ref 101–111)
Creatinine, Ser: 0.9 mg/dL (ref 0.44–1.00)
GFR calc Af Amer: >60 mL/min (ref >60)
GLUCOSE: 90 mg/dL (ref 65–99)
Potassium: 3.7 mmol/L (ref 3.5–5.1)
Sodium: 138 mmol/L (ref 135–145)

## 2017-07-01 MED ORDER — IOPAMIDOL (ISOVUE-300) INJECTION 61%
75.0000 mL | Freq: Once | INTRAVENOUS | Status: AC | PRN
  Administered 2017-07-01: 75 mL via INTRAVENOUS

## 2017-07-01 MED ORDER — SODIUM CHLORIDE 0.9 % IV BOLUS (SEPSIS): 500.0000 mL | Freq: Once | INTRAVENOUS | Status: DC

## 2017-07-01 MED ORDER — OXYCODONE-ACETAMINOPHEN 5-325 MG PO TABS
1.0000 | ORAL_TABLET | Freq: Once | ORAL | Status: AC
  Administered 2017-07-01: 1 via ORAL
  Filled 2017-07-01: qty 1

## 2017-07-01 MED ORDER — TRAMADOL HCL 50 MG PO TABS: 50.0000 mg | ORAL_TABLET | Freq: Once | ORAL | Status: DC

## 2017-07-01 MED ORDER — HYDROCODONE-ACETAMINOPHEN 5-325 MG PO TABS: 2.0000 | ORAL_TABLET | ORAL | 0 refills | Status: DC | PRN

## 2017-07-01 MED ORDER — IOPAMIDOL (ISOVUE-300) INJECTION 61%
INTRAVENOUS | Status: AC
  Administered 2017-07-01: 75 mL via INTRAVENOUS
  Filled 2017-07-01: qty 75

## 2017-07-01 NOTE — Discharge Instructions (Signed)
You were seen in the ED today with a skin infection on the neck. The CT did not show an abscess. You should continue you antibiotics and follow up with both your PCP and a local ENT. Apply warm compresses in the area.   Return to the ED with any fever, chills, difficulty breathing, or difficulty swallowing.

## 2017-07-01 NOTE — Telephone Encounter (Signed)
Prior authorization Approved for tramadol

## 2017-07-01 NOTE — ED Provider Notes (Signed)
Emergency Department Provider Note   I have reviewed the triage vital signs and the nursing notes.   HISTORY  Chief Complaint Abscess and Neck Pain   HPI Lei Dower is a 51 y.o. female with PMH of asthma, RA, and CVA presents to the emergency department for evaluation of right neck mass has become larger and more painful since I&D on Saturday at [facility]. Patient denies any associated fevers and chills. She was started on Bactrim which she began taking yesterday. No drainage from the mass. The staff at urgent care performed incision and drainage but patient reports minimal amount of drainage and the procedure was aborted. Patient states the area has become more tender and gotten larger since the procedure. She has a sensation of discomfort in the middle of her throat but denies any difficulty swallowing or changes to voice.   Past Medical History:  Diagnosis Date  . Asthma   . Chronic knee pain    right  . Meniscus tear   . Perimenopausal   . Rheumatoid arthritis ([place])   . Rotator cuff tear   . Slipped intervertebral disc   . Stroke [facility])     Patient Active Problem List   Diagnosis Date Noted  . Severe frontal headaches 12/06/2015  . Left hemiparesis ([place]) 10/07/2015  . Abscessed tooth 10/07/2015  . Asthma   . Acute blood loss anemia   . 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 Brain lesion 09/29/2015  . TIA (transient ischemic attack) 09/28/2015  . Headache 09/28/2015  . Adjustment disorder with mixed anxiety and depressed mood 07/02/2013  . Depressive disorder 07/03/2012    Past Surgical History:  Procedure Laterality Date  . APPLICATION OF CRANIAL NAVIGATION Right 10/03/2015   Procedure: APPLICATION OF CRANIAL NAVIGATION;  Surgeon: Consuella Lose, MD;  Location: [place] [facility];  Service: Neurosurgery;  Laterality: Right;  . CESAREAN SECTION    . CRANIOTOMY Right 10/03/2015   Procedure: Stereotactic right frontoparietal craniotomy for excisional biopsy of lesion ;  Surgeon:  Consuella Lose, MD;  Location: [place] [facility];  Service: Neurosurgery;  Laterality: Right;  . OOPHORECTOMY    . SHOULDER SURGERY    . TOOTH EXTRACTION N/A 10/07/2015   Procedure: Extraction of tooth #18 with alveoloplasty;  Surgeon: Lenn Cal, DDS;  Location: [place];  Service: Oral Surgery;  Laterality: N/A;  . TUBAL LIGATION      Current Outpatient Rx  . Order #: [id number] Class: Normal  . Order #: [id number] Class: Historical Med  . Order #: [id number] Class: Historical Med  . Order #: [id number] Class: Historical Med  . Order #: [id number] Class: Normal  . Order #: [id number] Class: Normal  . Order #: [id number] Class: Historical Med  . Order #: [id number] Class: Print  . Order #: [id number] Class: Historical Med  . Order #: [id number] Class: Normal  . Order #: [id number] Class: Phone In  . Order #: [id number] Class: Historical Med  . Order #: [id number] Class: Historical Med  . Order #: [id number] Class: Print    Allergies Diclofenac sodium  Family History  Problem Relation Age of Onset  . Cancer Mother        colon cancer  . Cancer Father        brain cancer  . Alcohol abuse Father   . Diabetes Father     Social History Social History  Substance Use Topics  . Smoking status: Never Smoker  . Smokeless tobacco: Never Used  . Alcohol use No    Review of Systems  Constitutional: No fever/chills Eyes: No visual changes. ENT:  No sore throat. Positive right neck mass.  Cardiovascular: Denies chest pain. Respiratory: Denies shortness of breath. Gastrointestinal: No abdominal pain.  No nausea, no vomiting.  No diarrhea.  No constipation. Genitourinary: Negative for dysuria. Musculoskeletal: Negative for back pain. Skin: Negative for rash. Neurological: Negative for headaches, focal weakness or numbness.  10-point ROS otherwise negative.  ____________________________________________   PHYSICAL EXAM:  VITAL SIGNS: ED Triage Vitals  Enc Vitals Group     BP 07/01/17 0817 (!)  158/84     Pulse Rate 07/01/17 0817 81     Resp 07/01/17 0817 16     Temp 07/01/17 0817 98.1 F (36.7 C)     Temp Source 07/01/17 0817 Oral     SpO2 07/01/17 0817 96 %     Pain Score 07/01/17 0823 10   Constitutional: Alert and oriented. Well appearing and in no acute distress. Eyes: Conjunctivae are normal. Head: Atraumatic. Nose: No congestion/rhinnorhea. Mouth/Throat: Mucous membranes are moist. Neck: No stridor. 2 x 3 cm erythematous right neck mass with greater area of surrounding induration. No drainage. No fluctuance. Mass is mobile.  Cardiovascular: Normal rate, regular rhythm. Good peripheral circulation. Grossly normal heart sounds.   Respiratory: Normal respiratory effort.  No retractions. Lungs CTAB. Gastrointestinal: Soft and nontender. No distention.  Musculoskeletal: No lower extremity tenderness nor edema. No gross deformities of extremities. Neurologic:  Normal speech and language. No gross focal neurologic deficits are appreciated.  Skin:  Skin is warm, dry and intact. No rash noted.  ____________________________________________   LABS (all labs ordered are listed, but only abnormal results are displayed)  Labs Reviewed  CULTURE, BLOOD (ROUTINE X 2)  CULTURE, BLOOD (ROUTINE X 2)  BASIC METABOLIC PANEL  CBC WITH DIFFERENTIAL/PLATELET   ____________________________________________  RADIOLOGY  Ct Soft Tissue Neck W Contrast  Result Date: 07/01/2017 CLINICAL DATA:  Right neck abscess, status post incision and drainage. EXAM: CT NECK WITH CONTRAST TECHNIQUE: Multidetector CT imaging of the neck was performed using the standard protocol following the bolus administration of intravenous contrast. CONTRAST:  75 mL Isovue-300 COMPARISON:  None. FINDINGS: Pharynx and larynx: --Nasopharynx: Fossae of Rosenmuller are clear. Normal adenoid tonsils for age. --Oral cavity and oropharynx: The palatine and lingual tonsils are normal. The visible oral cavity and floor of mouth  are normal. --Hypopharynx: Normal vallecula and pyriform sinuses. --Larynx: Normal epiglottis and pre-epiglottic space. Normal aryepiglottic and vocal folds. --Retropharyngeal space: No abscess, effusion or lymphadenopathy. Salivary glands: --Parotid: No mass lesion or inflammation. No sialolithiasis or ductal dilatation. --Submandibular: Symmetric without inflammation. No sialolithiasis or ductal dilatation. --Sublingual: Normal. No ranula or other visible lesion of the base of tongue and floor of mouth. Thyroid: 9 mm right thyroid hypodense nodule. Lymph nodes: No enlarged or abnormal density lymph nodes. Vascular: There is a normal variant aortic arch branching pattern with the brachiocephalic and left common carotid arteries sharing a common origin. Retropharyngeal right internal carotid artery at the level of the oropharynx. Limited intracranial: Normal. Visualized orbits: Normal. Mastoids and visualized paranasal sinuses: Bilateral maxillary retention cysts. Mastoids and middle ears are clear. Skeleton: No bony spinal canal stenosis. No lytic or blastic lesions. Upper chest: Clear. Other: There is skin thickening and subcutaneous inflammatory change within the superficial right anterior neck at approximately the level of the thyroid cartilage. There is low-attenuation material measuring 2.2 x 1.1 cm. No capsule is visible. IMPRESSION: 1. Skin thickening and subcutaneous inflammation of the right anterior neck at the level of the thyroid cartilage. Associated low attenuation material  is likely residual following incision and drainage of reported abscess. No in capsulated or well-defined fluid collection. 2. No lymphadenopathy. Electronically Signed   By: Ulyses Jarred M.D.   On: 07/01/2017 13:59    ____________________________________________   PROCEDURES  Procedure(s) performed:   Procedures  None ____________________________________________   INITIAL IMPRESSION / ASSESSMENT AND PLAN / ED  COURSE  Pertinent labs & imaging results that were available during my care of the patient were reviewed by me and considered in my medical decision making (see chart for details).  Patient with a right lateral neck mass increasing in size since incision and drainage. Patient started on Bactrim but has taken only 2 doses so far. No concern for airway compromise at this time. Went for CT of the neck for further evaluation along with labs and pain medication.   CT with no evidence of deep-space neck infection or abscess. Suspect cellulitis from I&D. No systemic signs of infection. Patient has only taken 2 doses of abx. Plan to continue Bactrim and provide pain medication. Plan for f/u with PCP and ENT. Discussed return precautions in detail.  ____________________________________________  FINAL CLINICAL IMPRESSION(S) / ED DIAGNOSES  Final diagnoses:  Neck mass  Cellulitis of neck     MEDICATIONS GIVEN DURING THIS VISIT:  Medications  oxyCODONE-acetaminophen (PERCOCET/ROXICET) 5-325 MG per tablet 1 tablet (1 tablet Oral Given 07/01/17 1243)  iopamidol (ISOVUE-300) 61 % injection 75 mL (75 mLs Intravenous Contrast Given 07/01/17 1501)     NEW OUTPATIENT MEDICATIONS STARTED DURING THIS VISIT:  Discharge Medication List as of 07/01/2017  3:16 PM    START taking these medications   Details  HYDROcodone-acetaminophen (NORCO/VICODIN) 5-325 MG tablet Take 2 tablets by mouth every 4 (four) hours as needed., Starting Mon 07/01/2017, Print        Note:  This document was prepared using Dragon voice recognition software and may include unintentional dictation errors.  Nanda Quinton, MD Emergency Medicine    Long, [facility], MD 07/02/17 [phone]

## 2017-07-01 NOTE — ED Triage Notes (Signed)
Pt presents with right neck abscess; had drained over the weekend at urgent care; size larger since I/D.

## 2017-07-03 ENCOUNTER — Telehealth: Payer: Self-pay | Admitting: Registered Nurse

## 2017-07-03 NOTE — Telephone Encounter (Signed)
Received a call from [name], she stated two weeks ago she noticed a bump on her neck ( right side) she thought it was a pimple. She began to watch it and noticed increase swelling and increase intensity of pain.  She went to [facility] on 06/29/2017, notes were reviewed. And Oval 2 cm by 1 cm abscess was noted and I&D was performed they were unable to obtain a good specimen to send for a wound culture. She was prescribed Bactrim.  On 07/01/2017 she went to [facility] ED she had noticed the right neck abscess was increasing in size and became fearful with her history of Brain abscess. Note was reviewed, Lab work was performed and CT scan.  Blood Culture: No Growth CT Scan of Neck: IMPRESSION: 1. Skin thickening and subcutaneous inflammation of the right anterior neck at the level of the thyroid cartilage. Associated low attenuation material is likely residual following incision and drainage of reported abscess. No in capsulated or well-defined fluid collection. 2. No lymphadenopathy.  Other: There is skin thickening and subcutaneous inflammatory change within the superficial right anterior neck at approximately the level of the thyroid cartilage. There is low-attenuation material measuring 2.2 x 1.1 cm. No capsule is visible.  The Plan was to F/U with PCP and ENT: At this time she is in the process of obtaining a new PCP, she stated she will f/u with ENT.  She called and stated she is still having neck pain with tenderness and is very concerned due to her history of brain abscess. Instructed to call ENT today, she verbalizes understanding.

## 2017-07-06 LAB — CULTURE, BLOOD (ROUTINE X 2)
CULTURE: NO GROWTH
Special Requests: ADEQUATE

## 2017-07-17 ENCOUNTER — Encounter: Payer: Self-pay | Admitting: Registered Nurse

## 2017-07-17 ENCOUNTER — Encounter (HOSPITAL_BASED_OUTPATIENT_CLINIC_OR_DEPARTMENT_OTHER): Payer: Medicaid Other | Admitting: Registered Nurse

## 2017-07-17 VITALS — BP 141/84 | HR 81

## 2017-07-17 DIAGNOSIS — G894 Chronic pain syndrome: Secondary | ICD-10-CM | POA: Diagnosis not present

## 2017-07-17 DIAGNOSIS — R51 Headache: Secondary | ICD-10-CM

## 2017-07-17 DIAGNOSIS — R519 Headache, unspecified: Secondary | ICD-10-CM

## 2017-07-17 DIAGNOSIS — Z79899 Other long term (current) drug therapy: Secondary | ICD-10-CM

## 2017-07-17 DIAGNOSIS — G060 Intracranial abscess and granuloma: Secondary | ICD-10-CM | POA: Diagnosis not present

## 2017-07-17 DIAGNOSIS — Z5181 Encounter for therapeutic drug level monitoring: Secondary | ICD-10-CM | POA: Diagnosis not present

## 2017-07-17 MED ORDER — OXYCODONE-ACETAMINOPHEN 5-325 MG PO TABS: 0.5000 | ORAL_TABLET | Freq: Two times a day (BID) | ORAL | 0 refills | Status: DC | PRN

## 2017-07-17 NOTE — Progress Notes (Signed)
Subjective:    Patient ID: [name], female    DOB: [birth year], 51 y.o.   MRN: [id number]  HPI: [name] is a 51 year old female who returns for follow up appointmentfor chronicpain and medication refill. She states her pain is located in her head ( headache) occasionally headaches, neck and lower back pain. She rates her pain 4.Her current exercise regime is walking 30-45 minutes daily and attending the [facility] three times a week for water aerobics three times a week.   [name] picked up her Oxycodone on 06/18/2017, per PMP Aware web-site, she was prescribed Hydrocodone on 07/01/2017 by Dr. Laverta Baltimore when she was seen in [facility] ED for Neck Mass.   She was seen in Otolaryngology Dr. Constance Holster on 07/04/2017 for cutaneous abscess, notes reviewed.   [name] equivalent is  30 MME.    Her last UDS 01/15/2017, inconsistent for Oxycodone.    Pain Inventory Average Pain 7 Pain Right Now 4 My pain is dull  In the last 24 hours, has pain interfered with the following? General activity 4 Relation with others 4 Enjoyment of life 3 What TIME of day is your pain at its worst? morning and night Sleep (in general) Fair  Pain is worse with: . Pain improves with: rest, heat/ice, therapy/exercise and medication Relief from Meds: 6  Mobility walk without assistance ability to climb steps?  yes do you drive?  yes  Function Do you have any goals in this area?  no  Neuro/Psych No problems in this area  Prior Studies Any changes since last visit?  no  Physicians involved in your care Any changes since last visit?  no   Family History  Problem Relation Age of Onset  . Cancer Mother        colon cancer  . Cancer Father        brain cancer  . Alcohol abuse Father   . Diabetes Father    Social History   Social History  . Marital status: Divorced    Spouse name: N/A  . Number of children: N/A  . Years of education: N/A   Social History Main Topics  .  Smoking status: Never Smoker  . Smokeless tobacco: Never Used  . Alcohol use No  . Drug use: No  . Sexual activity: Not Currently   Other Topics Concern  . Not on file   Social History Narrative  . No narrative on file   Past Surgical History:  Procedure Laterality Date  . APPLICATION OF CRANIAL NAVIGATION Right 10/03/2015   Procedure: APPLICATION OF CRANIAL NAVIGATION;  Surgeon: Consuella Lose, MD;  Location: [place] [facility];  Service: Neurosurgery;  Laterality: Right;  . CESAREAN SECTION    . CRANIOTOMY Right 10/03/2015   Procedure: Stereotactic right frontoparietal craniotomy for excisional biopsy of lesion ;  Surgeon: Consuella Lose, MD;  Location: [place] [facility];  Service: Neurosurgery;  Laterality: Right;  . OOPHORECTOMY    . SHOULDER SURGERY    . TOOTH EXTRACTION N/A 10/07/2015   Procedure: Extraction of tooth #18 with alveoloplasty;  Surgeon: Lenn Cal, DDS;  Location: [place];  Service: Oral Surgery;  Laterality: N/A;  . TUBAL LIGATION     Past Medical History:  Diagnosis Date  . Asthma   . Chronic knee pain    right  . Meniscus tear   . Perimenopausal   . Rheumatoid arthritis ([place])   . Rotator cuff tear   . Slipped intervertebral disc   .  Stroke ([place])    LMP 09/27/2011   Opioid Risk Score:  2 Fall Risk Score:  `1  Depression screen PHQ 2/9  Depression screen [facility] 2/9 07/17/2017 06/18/2017 05/23/2017 12/04/2016 09/24/2016 05/14/2016 12/06/2015  Decreased Interest 0 0 3 1 1 1 1   Down, Depressed, Hopeless 0 0 3 1 1 1 1   PHQ - 2 Score 0 0 6 2 2 2 2   Altered sleeping - - - - - - 1  Tired, decreased energy - - - - - - 1  Change in appetite - - - - - - 0  Feeling bad or failure about yourself  - - - - - - 0  Trouble concentrating - - - - - - 1  Moving slowly or fidgety/restless - - - - - - 1  Suicidal thoughts - - - - - - 0  PHQ-9 Score - - - - - - 6  Difficult doing work/chores - - - - - - Somewhat difficult    Review of Systems  Constitutional: Negative.     HENT: Negative.   Eyes: Negative.   Respiratory: Negative.   Cardiovascular: Negative.   Gastrointestinal: Negative.   Endocrine: Negative.   Genitourinary: Negative.   Musculoskeletal: Negative.   Allergic/Immunologic: Negative.   Neurological: Negative.   Hematological: Negative.   Psychiatric/Behavioral: Positive for dysphoric mood. The patient is nervous/anxious.   All other systems reviewed and are negative.      Objective:   Physical Exam  Constitutional: She is oriented to person, place, and time. She appears well-developed and well-nourished.  HENT:  Head: Normocephalic and atraumatic.  Neck: Normal range of motion. Neck supple.  Cardiovascular: Normal rate and regular rhythm.   Pulmonary/Chest: Effort normal and breath sounds normal.  Musculoskeletal:  Normal Muscle Bulk and Muscle Testing Reveals: Upper Extremities: Full  ROM and Muscle Strength 5/5 Lumbar Paraspinal Tenderness: L-4-L-5 Lower Extremities: Full ROM and Muscle Strength 5/5 Arises from chair with ease Narrow Based Gait    Neurological: She is alert and oriented to person, place, and time.  Skin: Skin is warm.  Psychiatric: She has a normal mood and affect.  Nursing note and vitals reviewed.         Assessment & Plan:  1. 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 07/17/2017 2. Headaches: Refilled: Percocet 5/325 mg 0.5-one tablet every 8 hours As needed for severe painas needed #60. Continue Tramadol  Continue with slow weaning of Oxycodone.  We will continue the opioid monitoring program, this consists of regular clinic visits, examinations, urine drug screen, pill counts as well as use of North  3. Chronic Pain Syndrome: Continue Gabapentin . 07/17/2017 4. Left Knee Pain: No complaints today.Dr. Noemi Chapel Following. 07/17/2017  30 minutes of face to face patient care time was spent during this visit. All questions were encouraged and  answered.  Follow up in 1 month

## 2017-07-18 ENCOUNTER — Ambulatory Visit: Payer: Self-pay | Admitting: Registered Nurse

## 2017-08-05 ENCOUNTER — Other Ambulatory Visit: Payer: Self-pay | Admitting: Registered Nurse

## 2017-08-05 ENCOUNTER — Telehealth: Payer: Self-pay | Admitting: Registered Nurse

## 2017-08-05 MED ORDER — TRAMADOL HCL 50 MG PO TABS: 50.0000 mg | ORAL_TABLET | Freq: Three times a day (TID) | ORAL | 3 refills | Status: DC | PRN

## 2017-08-05 NOTE — Telephone Encounter (Signed)
[name] requesting refill on Tramadol, Tramadol called in. Ms. Strader aware of the above.

## 2017-08-12 ENCOUNTER — Telehealth: Payer: Self-pay | Admitting: Registered Nurse

## 2017-08-12 NOTE — Telephone Encounter (Signed)
Received a call from Ms. Kapler she has an appointment on Friday 08/16/2017. Her appointment has been rescheduled for 08/13/2017, she verbalizes understanding.

## 2017-08-13 ENCOUNTER — Telehealth: Payer: Self-pay | Admitting: Registered Nurse

## 2017-08-13 ENCOUNTER — Encounter: Payer: Medicaid Other | Attending: Physical Medicine & Rehabilitation | Admitting: Registered Nurse

## 2017-08-13 ENCOUNTER — Encounter: Payer: Self-pay | Admitting: Registered Nurse

## 2017-08-13 VITALS — BP 138/82 | HR 76

## 2017-08-13 DIAGNOSIS — Z808 Family history of malignant neoplasm of other organs or systems: Secondary | ICD-10-CM | POA: Insufficient documentation

## 2017-08-13 DIAGNOSIS — R42 Dizziness and giddiness: Secondary | ICD-10-CM | POA: Diagnosis not present

## 2017-08-13 DIAGNOSIS — G060 Intracranial abscess and granuloma: Secondary | ICD-10-CM

## 2017-08-13 DIAGNOSIS — G8929 Other chronic pain: Secondary | ICD-10-CM | POA: Diagnosis present

## 2017-08-13 DIAGNOSIS — Z800 Family history of malignant neoplasm of digestive organs: Secondary | ICD-10-CM | POA: Diagnosis not present

## 2017-08-13 DIAGNOSIS — M069 Rheumatoid arthritis, unspecified: Secondary | ICD-10-CM | POA: Diagnosis not present

## 2017-08-13 DIAGNOSIS — Z5181 Encounter for therapeutic drug level monitoring: Secondary | ICD-10-CM | POA: Diagnosis not present

## 2017-08-13 DIAGNOSIS — R410 Disorientation, unspecified: Secondary | ICD-10-CM | POA: Diagnosis not present

## 2017-08-13 DIAGNOSIS — J45909 Unspecified asthma, uncomplicated: Secondary | ICD-10-CM | POA: Diagnosis not present

## 2017-08-13 DIAGNOSIS — R51 Headache: Secondary | ICD-10-CM | POA: Diagnosis not present

## 2017-08-13 DIAGNOSIS — G894 Chronic pain syndrome: Secondary | ICD-10-CM

## 2017-08-13 DIAGNOSIS — Z833 Family history of diabetes mellitus: Secondary | ICD-10-CM | POA: Diagnosis not present

## 2017-08-13 DIAGNOSIS — R531 Weakness: Secondary | ICD-10-CM | POA: Insufficient documentation

## 2017-08-13 DIAGNOSIS — Z811 Family history of alcohol abuse and dependence: Secondary | ICD-10-CM | POA: Insufficient documentation

## 2017-08-13 DIAGNOSIS — S83206A Unspecified tear of unspecified meniscus, current injury, right knee, initial encounter: Secondary | ICD-10-CM | POA: Diagnosis not present

## 2017-08-13 DIAGNOSIS — R519 Headache, unspecified: Secondary | ICD-10-CM

## 2017-08-13 DIAGNOSIS — Z79899 Other long term (current) drug therapy: Secondary | ICD-10-CM

## 2017-08-13 MED ORDER — OXYCODONE-ACETAMINOPHEN 5-325 MG PO TABS: 0.5000 | ORAL_TABLET | Freq: Two times a day (BID) | ORAL | 0 refills | Status: DC | PRN

## 2017-08-13 NOTE — Telephone Encounter (Signed)
On 08/13/2017 the [place] was reviewed no conflict was seen on the [place] with multiple prescribers. [name] has a signed narcotic contract with our office. If there were any discrepancies this would have been reported to her physician.

## 2017-08-13 NOTE — Progress Notes (Signed)
Subjective:    Patient ID: [name], female    DOB: 10-27-1965, 51 y.o.   MRN: [id number]  HPI: [name] is a 51 year old female who returns for follow up appointmentfor chronicpain and medication refill. She states her pain is located in her head ( headache) occasionally, no headache at this time. Also reports upper back pain. She rates her pain 5.Her current exercise regime is walking 30-45 minutes daily.  [name] Morphine equivalent is  30 MME.    Her last UDS 01/15/2017, inconsistent for Oxycodone.    Pain Inventory Average Pain 8 Pain Right Now 5 My pain is dull  In the last 24 hours, has pain interfered with the following? General activity 5 Relation with others 5 Enjoyment of life 5 What TIME of day is your pain at its worst? morning Sleep (in general) Fair  Pain is worse with: . Pain improves with: rest, heat/ice, therapy/exercise and medication Relief from Meds: 6  Mobility walk without assistance ability to climb steps?  yes do you drive?  yes  Function Do you have any goals in this area?  no  Neuro/Psych No problems in this area  Prior Studies Any changes since last visit?  no  Physicians involved in your care Any changes since last visit?  no   Family History  Problem Relation Age of Onset  . Cancer Mother        colon cancer  . Cancer Father        brain cancer  . Alcohol abuse Father   . Diabetes Father    Social History   Socioeconomic History  . Marital status: Divorced    Spouse name: Not on file  . Number of children: Not on file  . Years of education: Not on file  . Highest education level: Not on file  Social Needs  . Financial resource strain: Not on file  . Food insecurity - worry: Not on file  . Food insecurity - inability: Not on file  . Transportation needs - medical: Not on file  . Transportation needs - non-medical: Not on file  Occupational History  . Not on file  Tobacco Use  . Smoking status:  Never Smoker  . Smokeless tobacco: Never Used  Substance and Sexual Activity  . Alcohol use: No  . Drug use: No  . Sexual activity: Not Currently  Other Topics Concern  . Not on file  Social History Narrative  . Not on file   Past Surgical History:  Procedure Laterality Date  . APPLICATION OF CRANIAL NAVIGATION Right 10/03/2015   Procedure: APPLICATION OF CRANIAL NAVIGATION;  Surgeon: Consuella Lose, MD;  Location: [place] [facility];  Service: Neurosurgery;  Laterality: Right;  . CESAREAN SECTION    . CRANIOTOMY Right 10/03/2015   Procedure: Stereotactic right frontoparietal craniotomy for excisional biopsy of lesion ;  Surgeon: Consuella Lose, MD;  Location: [place] [facility];  Service: Neurosurgery;  Laterality: Right;  . OOPHORECTOMY    . SHOULDER SURGERY    . TOOTH EXTRACTION N/A 10/07/2015   Procedure: Extraction of tooth #18 with alveoloplasty;  Surgeon: Lenn Cal, DDS;  Location: [place];  Service: Oral Surgery;  Laterality: N/A;  . TUBAL LIGATION     Past Medical History:  Diagnosis Date  . Asthma   . Chronic knee pain    right  . Meniscus tear   . Perimenopausal   . Rheumatoid arthritis ([place])   . Rotator cuff tear   .  Slipped intervertebral disc   . Stroke ([place])    LMP 09/27/2011   Opioid Risk Score:  2 Fall Risk Score:  `1  Depression screen PHQ 2/9  Depression screen [facility] 2/9 07/17/2017 06/18/2017 05/23/2017 12/04/2016 09/24/2016 05/14/2016 12/06/2015  Decreased Interest 0 0 3 1 1 1 1   Down, Depressed, Hopeless 0 0 3 1 1 1 1   PHQ - 2 Score 0 0 6 2 2 2 2   Altered sleeping - - - - - - 1  Tired, decreased energy - - - - - - 1  Change in appetite - - - - - - 0  Feeling bad or failure about yourself  - - - - - - 0  Trouble concentrating - - - - - - 1  Moving slowly or fidgety/restless - - - - - - 1  Suicidal thoughts - - - - - - 0  PHQ-9 Score - - - - - - 6  Difficult doing work/chores - - - - - - Somewhat difficult    Review of Systems  Constitutional:  Negative.   HENT: Negative.   Eyes: Negative.   Respiratory: Negative.   Cardiovascular: Negative.   Gastrointestinal: Negative.   Endocrine: Negative.   Genitourinary: Negative.   Musculoskeletal: Negative.   Allergic/Immunologic: Negative.   Neurological: Negative.   Hematological: Negative.   Psychiatric/Behavioral: Negative.   All other systems reviewed and are negative.      Objective:   Physical Exam  Constitutional: She is oriented to person, place, and time. She appears well-developed and well-nourished.  HENT:  Head: Normocephalic and atraumatic.  Neck: Normal range of motion. Neck supple.  Cardiovascular: Normal rate and regular rhythm.  Pulmonary/Chest: Effort normal and breath sounds normal.  Musculoskeletal:  Normal Muscle Bulk and Muscle Testing Reveals: Upper Extremities: Full  ROM and Muscle Strength 5/5 Thoracic  Paraspinal Tenderness: T-1-T-3 Lower Extremities: Full ROM and Muscle Strength 5/5 Arises from chair with ease Narrow Based Gait    Neurological: She is alert and oriented to person, place, and time.  Skin: Skin is warm.  Psychiatric: She has a normal mood and affect.  Nursing note and vitals reviewed.         Assessment & Plan:  1. 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 08/13/2017 2. Headaches: Refilled: Percocet 5/325 mg 0.5-one tablet every 8 hours As needed for severe painas needed #60. Continue Tramadol  Continue with slow weaning of Oxycodone.  We will continue the opioid monitoring program, this consists of regular clinic visits, examinations, urine drug screen, pill counts as well as use of North  3. Chronic Pain Syndrome: Continue Gabapentin . 08/13/2017 4. Left Knee Pain: No complaints today.Dr. Noemi Chapel Following. 08/13/2017  20 minutes of face to face patient care time was spent during this visit. All questions were encouraged and answered.  Follow up in 1 month

## 2017-08-16 ENCOUNTER — Ambulatory Visit: Payer: Self-pay | Admitting: Registered Nurse

## 2017-08-20 ENCOUNTER — Encounter: Payer: Medicaid Other | Admitting: Registered Nurse

## 2017-09-04 ENCOUNTER — Telehealth: Payer: Self-pay | Admitting: Registered Nurse

## 2017-09-04 MED ORDER — METHYLPREDNISOLONE 4 MG PO TBPK: ORAL_TABLET | ORAL | 0 refills | Status: DC

## 2017-09-04 NOTE — Telephone Encounter (Signed)
[name] called reporting increase inflammation in her upper back since last week during the snow storm she had to shovel. Medrol dose pak will be ordered today.  She's  scheduled for 11:00 appointment on 09/05/2017, she verbalizes understanding.

## 2017-09-05 ENCOUNTER — Encounter: Payer: Medicaid Other | Attending: Physical Medicine & Rehabilitation | Admitting: Registered Nurse

## 2017-09-05 ENCOUNTER — Other Ambulatory Visit: Payer: Self-pay

## 2017-09-05 ENCOUNTER — Encounter: Payer: Self-pay | Admitting: Registered Nurse

## 2017-09-05 VITALS — BP 132/76 | HR 82

## 2017-09-05 DIAGNOSIS — M546 Pain in thoracic spine: Secondary | ICD-10-CM

## 2017-09-05 DIAGNOSIS — M7582 Other shoulder lesions, left shoulder: Secondary | ICD-10-CM | POA: Diagnosis not present

## 2017-09-05 DIAGNOSIS — R51 Headache: Secondary | ICD-10-CM | POA: Insufficient documentation

## 2017-09-05 DIAGNOSIS — J45909 Unspecified asthma, uncomplicated: Secondary | ICD-10-CM | POA: Insufficient documentation

## 2017-09-05 DIAGNOSIS — Z800 Family history of malignant neoplasm of digestive organs: Secondary | ICD-10-CM | POA: Insufficient documentation

## 2017-09-05 DIAGNOSIS — R42 Dizziness and giddiness: Secondary | ICD-10-CM | POA: Diagnosis not present

## 2017-09-05 DIAGNOSIS — S83206A Unspecified tear of unspecified meniscus, current injury, right knee, initial encounter: Secondary | ICD-10-CM | POA: Diagnosis not present

## 2017-09-05 DIAGNOSIS — G8929 Other chronic pain: Secondary | ICD-10-CM | POA: Insufficient documentation

## 2017-09-05 DIAGNOSIS — G894 Chronic pain syndrome: Secondary | ICD-10-CM

## 2017-09-05 DIAGNOSIS — R531 Weakness: Secondary | ICD-10-CM | POA: Diagnosis not present

## 2017-09-05 DIAGNOSIS — M25511 Pain in right shoulder: Secondary | ICD-10-CM

## 2017-09-05 DIAGNOSIS — Z833 Family history of diabetes mellitus: Secondary | ICD-10-CM | POA: Insufficient documentation

## 2017-09-05 DIAGNOSIS — Z5181 Encounter for therapeutic drug level monitoring: Secondary | ICD-10-CM

## 2017-09-05 DIAGNOSIS — Z811 Family history of alcohol abuse and dependence: Secondary | ICD-10-CM | POA: Diagnosis not present

## 2017-09-05 DIAGNOSIS — Z808 Family history of malignant neoplasm of other organs or systems: Secondary | ICD-10-CM | POA: Insufficient documentation

## 2017-09-05 DIAGNOSIS — G060 Intracranial abscess and granuloma: Secondary | ICD-10-CM | POA: Diagnosis not present

## 2017-09-05 DIAGNOSIS — M778 Other enthesopathies, not elsewhere classified: Secondary | ICD-10-CM

## 2017-09-05 DIAGNOSIS — Z79899 Other long term (current) drug therapy: Secondary | ICD-10-CM

## 2017-09-05 DIAGNOSIS — M069 Rheumatoid arthritis, unspecified: Secondary | ICD-10-CM | POA: Insufficient documentation

## 2017-09-05 DIAGNOSIS — M7581 Other shoulder lesions, right shoulder: Secondary | ICD-10-CM | POA: Diagnosis not present

## 2017-09-05 DIAGNOSIS — R410 Disorientation, unspecified: Secondary | ICD-10-CM | POA: Insufficient documentation

## 2017-09-05 DIAGNOSIS — S46911A Strain of unspecified muscle, fascia and tendon at shoulder and upper arm level, right arm, initial encounter: Secondary | ICD-10-CM | POA: Diagnosis not present

## 2017-09-05 DIAGNOSIS — R519 Headache, unspecified: Secondary | ICD-10-CM

## 2017-09-05 MED ORDER — OXYCODONE-ACETAMINOPHEN 5-325 MG PO TABS: 0.5000 | ORAL_TABLET | Freq: Four times a day (QID) | ORAL | 0 refills | Status: DC | PRN

## 2017-09-05 MED ORDER — HYDROCODONE-ACETAMINOPHEN 5-325 MG PO TABS: 1.0000 | ORAL_TABLET | Freq: Four times a day (QID) | ORAL | 0 refills | Status: DC | PRN

## 2017-09-05 NOTE — Progress Notes (Signed)
Subjective:    Patient ID: [name], female    DOB: Dec 21, 1965, 51 y.o.   MRN: [id number]  HPI: [name] is a 51 year old female who returns for follow up appointmentfor chronicpain and medication refill. She states her pain is located in her head ( headache) occasionally, no headache at this time, left shoulder and upper back. [name] called office on 09/04/2017 with increase intensity of left shoulder pain with inflammation. With shoveling and caring for children during the snow storm has increased in intensity she realizes she over exerted herself, educated on balance and temperance with activity, she verbalizes understanding. Medrol dose pak was ordered yesterday, we will increase her Hydrocodone tablets for two weeks. [name] expresses's interest every month about weaning off Oxycodone we have prescribed Tramadol not effective at this time. We shall prescribe Oxycodone for two weeks and changed to hydrocodone with the goal to change to  Tramadol and eventually weaned off opioids. She verbalizes understanding. Left shoulder X-ray ordered, if shoulder pain increases in intensity she will follow up with her orthopedist she states.  She rates her pain 9.Her current exercise regime is walking 30-45 minutes daily.  [name] Morphine equivalent is  30 MME.    Her last UDS 01/15/2017, inconsistent for Oxycodone.    Pain Inventory Average Pain 9 Pain Right Now 9 My pain is dull and aching  In the last 24 hours, has pain interfered with the following? General activity 10 Relation with others 10 Enjoyment of life 10 What TIME of day is your pain at its worst? morning evening and night Sleep (in general) Poor  Pain is worse with: walking and some activites Pain improves with: medication Relief from Meds: 5  Mobility walk without assistance Do you have any goals in this area?  no  Function Do you have any goals in this area?  no  Neuro/Psych spasms  Prior  Studies Any changes since last visit?  no  Physicians involved in your care Any changes since last visit?  no   Family History  Problem Relation Age of Onset  . Cancer Mother        colon cancer  . Cancer Father        brain cancer  . Alcohol abuse Father   . Diabetes Father    Social History   Socioeconomic History  . Marital status: Divorced    Spouse name: None  . Number of children: None  . Years of education: None  . Highest education level: None  Social Needs  . Financial resource strain: None  . Food insecurity - worry: None  . Food insecurity - inability: None  . Transportation needs - medical: None  . Transportation needs - non-medical: None  Occupational History  . None  Tobacco Use  . Smoking status: Never Smoker  . Smokeless tobacco: Never Used  Substance and Sexual Activity  . Alcohol use: No  . Drug use: No  . Sexual activity: Not Currently  Other Topics Concern  . None  Social History Narrative  . None   Past Surgical History:  Procedure Laterality Date  . APPLICATION OF CRANIAL NAVIGATION Right 10/03/2015   Procedure: APPLICATION OF CRANIAL NAVIGATION;  Surgeon: Consuella Lose, MD;  Location: [place] [facility];  Service: Neurosurgery;  Laterality: Right;  . CESAREAN SECTION    . CRANIOTOMY Right 10/03/2015   Procedure: Stereotactic right frontoparietal craniotomy for excisional biopsy of lesion ;  Surgeon: Consuella Lose, MD;  Location:  [place] [facility];  Service: Neurosurgery;  Laterality: Right;  . OOPHORECTOMY    . SHOULDER SURGERY    . TOOTH EXTRACTION N/A 10/07/2015   Procedure: Extraction of tooth #18 with alveoloplasty;  Surgeon: Lenn Cal, DDS;  Location: [place];  Service: Oral Surgery;  Laterality: N/A;  . TUBAL LIGATION     Past Medical History:  Diagnosis Date  . Asthma   . Chronic knee pain    right  . Meniscus tear   . Perimenopausal   . Rheumatoid arthritis ([place])   . Rotator cuff tear   . Slipped intervertebral disc   .  Stroke ([place])    BP (!) 159/85   Pulse 82   LMP 09/27/2011   SpO2 96%   Opioid Risk Score:  2 Fall Risk Score:  `1  Depression screen PHQ 2/9  Depression screen [facility] 2/9 09/05/2017 07/17/2017 06/18/2017 05/23/2017 12/04/2016 09/24/2016 05/14/2016  Decreased Interest 0 0 0 3 1 1 1   Down, Depressed, Hopeless 0 0 0 3 1 1 1   PHQ - 2 Score 0 0 0 6 2 2 2   Altered sleeping - - - - - - -  Tired, decreased energy - - - - - - -  Change in appetite - - - - - - -  Feeling bad or failure about yourself  - - - - - - -  Trouble concentrating - - - - - - -  Moving slowly or fidgety/restless - - - - - - -  Suicidal thoughts - - - - - - -  PHQ-9 Score - - - - - - -  Difficult doing work/chores - - - - - - -    Review of Systems  Constitutional: Negative.   HENT: Negative.   Eyes: Negative.   Respiratory: Negative.   Cardiovascular: Negative.   Gastrointestinal: Negative.   Endocrine: Negative.   Genitourinary: Negative.   Musculoskeletal: Positive for neck pain and neck stiffness.  Allergic/Immunologic: Negative.   Neurological: Negative.   Hematological: Negative.   Psychiatric/Behavioral: Negative.   All other systems reviewed and are negative.      Objective:   Physical Exam  Constitutional: She is oriented to person, place, and time. She appears well-developed and well-nourished.  HENT:  Head: Normocephalic and atraumatic.  Neck: Normal range of motion. Neck supple.  Cardiovascular: Normal rate and regular rhythm.  Pulmonary/Chest: Effort normal and breath sounds normal.  Musculoskeletal:  Normal Muscle Bulk and Muscle Testing Reveals: Upper Extremities: Full  ROM and Muscle Strength 5/5 Right AC Joint Tenderness Thoracic Inflammation: Left: T-1- T-3 Mainly Left Side Thoracic  Paraspinal Tenderness: T-1-T-3 Lower Extremities: Full ROM and Muscle Strength 5/5 Arises from chair with ease Narrow Based Gait    Neurological: She is alert and oriented to person, place, and time.   Skin: Skin is warm.  Psychiatric: She has a normal mood and affect.  Nursing note and vitals reviewed.         Assessment & Plan:  1. 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 09/05/2017 2. Headaches: Refilled: Percocet 5/325 mg 0.5-one tablet every 6 hours As needed for severe painas needed #48 for two weeks.  Change to Hydrocodone 5/325 mg one tablet every 6 hours as needed for moderate pain #40. Hold  Tramadol at this time. After Acte Pain we will resume slow weaning of Oxycodone. 09/05/2017. We will continue the opioid monitoring program, this consists of regular clinic visits,  examinations, urine drug screen, pill counts as well as use of North  3. Chronic Pain Syndrome: Continue Gabapentin .09/05/2017 4. Left Knee Pain: No complaints today.Dr. Noemi Chapel Following. 09/05/2017  30 minutes of face to face patient care time was spent during this visit. All questions were encouraged and answered.  Follow up in 1 month

## 2017-09-06 ENCOUNTER — Telehealth: Payer: Self-pay | Admitting: Registered Nurse

## 2017-09-06 NOTE — Telephone Encounter (Signed)
[name] pharmacy only dispense one week of Oxycodone, they read the prescription as an acute care prescription. [name] will call office in a week for the remainder of Oxycodone prescription. I spoke with the pharmacist.

## 2017-09-13 ENCOUNTER — Telehealth: Payer: Self-pay | Admitting: *Deleted

## 2017-09-13 DIAGNOSIS — R51 Headache: Principal | ICD-10-CM

## 2017-09-13 DIAGNOSIS — R519 Headache, unspecified: Secondary | ICD-10-CM

## 2017-09-13 MED ORDER — OXYCODONE-ACETAMINOPHEN 5-325 MG PO TABS: 0.5000 | ORAL_TABLET | Freq: Four times a day (QID) | ORAL | 0 refills | Status: DC | PRN

## 2017-09-13 NOTE — Telephone Encounter (Signed)
Pharmacy only filled a 7 day supply of her pain meds.  She is asking [name] to write her another script to cover the balance. [name] agrees. Patient says she will be here 09/13/2017 around 3:00pm

## 2017-09-13 NOTE — Telephone Encounter (Signed)
Pharmacist only dispense a week of Oxycodone. Oxycodone prescription printed for a week, then she will began Hydrocodone. With a goal to prescribe Tramadol. Ms. Witzke was notified of the above. She will pick up prescription this afternoon

## 2017-09-23 ENCOUNTER — Telehealth: Payer: Self-pay | Admitting: Registered Nurse

## 2017-09-23 NOTE — Telephone Encounter (Signed)
Received a call from Kathryne Hitch, Ms. Carlson states on Friday 09/20/2017 she developed a headache and was very fatigue also denies any cold symptoms, fever or chills. Also reports she had taken her Oxycodone as prescribed, she did receive some relief of her headache. This morning after she hd taken her son's to school she had experienced some disorientation and  headache she had correlated this with her blood sugar. She did not check her blood sugar at that time, she did eat something. She stated for the rest of the day she was not feeling well, headache more intense. We have been weaning her slowly from Oxycodone and she has  tolerate the wean.Today was the first day using the Hydrocodone and continues with Tramadol for  Breakthrough pain. She also reports she has been under stress, instructed to go to the Emergency Room for Evauation due to her past history of Brain Abscess.  She verbalizes  understanding.She checked her blood sugar it was 121. She will call her sister to come and stay with her, she states and if she feels worse will go to the Emergency Department. I instructed Ms. Broadnax to go to the Emergency Department for evaluation, she verbalizes understanding.

## 2017-09-24 ENCOUNTER — Encounter (HOSPITAL_COMMUNITY): Payer: Self-pay | Admitting: *Deleted

## 2017-09-24 ENCOUNTER — Emergency Department (HOSPITAL_COMMUNITY)
Admission: EM | Admit: 2017-09-24 | Discharge: 2017-09-24 | Disposition: A | Discharge status: Home / Self Care | Payer: Medicaid Other | Attending: Emergency Medicine | Admitting: Emergency Medicine

## 2017-09-24 ENCOUNTER — Emergency Department (HOSPITAL_COMMUNITY): Payer: Medicaid Other

## 2017-09-24 DIAGNOSIS — Z8673 Personal history of transient ischemic attack (TIA), and cerebral infarction without residual deficits: Secondary | ICD-10-CM | POA: Diagnosis not present

## 2017-09-24 DIAGNOSIS — M6281 Muscle weakness (generalized): Secondary | ICD-10-CM | POA: Insufficient documentation

## 2017-09-24 DIAGNOSIS — R110 Nausea: Secondary | ICD-10-CM | POA: Insufficient documentation

## 2017-09-24 DIAGNOSIS — R531 Weakness: Secondary | ICD-10-CM

## 2017-09-24 DIAGNOSIS — R630 Anorexia: Secondary | ICD-10-CM | POA: Diagnosis not present

## 2017-09-24 DIAGNOSIS — R197 Diarrhea, unspecified: Secondary | ICD-10-CM | POA: Diagnosis not present

## 2017-09-24 DIAGNOSIS — R202 Paresthesia of skin: Secondary | ICD-10-CM | POA: Insufficient documentation

## 2017-09-24 DIAGNOSIS — J45909 Unspecified asthma, uncomplicated: Secondary | ICD-10-CM | POA: Diagnosis not present

## 2017-09-24 DIAGNOSIS — Z79899 Other long term (current) drug therapy: Secondary | ICD-10-CM | POA: Insufficient documentation

## 2017-09-24 DIAGNOSIS — R51 Headache: Secondary | ICD-10-CM | POA: Diagnosis not present

## 2017-09-24 LAB — RAPID URINE DRUG SCREEN, HOSP PERFORMED
Amphetamines: NOT DETECTED
Barbiturates: NOT DETECTED
Benzodiazepines: NOT DETECTED
COCAINE: NOT DETECTED
OPIATES: POSITIVE — AB
Tetrahydrocannabinol: NOT DETECTED

## 2017-09-24 LAB — URINALYSIS, ROUTINE W REFLEX MICROSCOPIC
Bilirubin Urine: NEGATIVE
GLUCOSE, UA: NEGATIVE mg/dL
Hgb urine dipstick: NEGATIVE
Ketones, ur: NEGATIVE mg/dL
LEUKOCYTES UA: NEGATIVE
Nitrite: NEGATIVE
PROTEIN: NEGATIVE mg/dL
Specific Gravity, Urine: 1.014 (ref 1.005–1.030)
pH: 7 (ref 5.0–8.0)

## 2017-09-24 LAB — CBC WITH DIFFERENTIAL/PLATELET
BASOS PCT: 1 %
Basophils Absolute: 0 10*3/uL (ref 0.0–0.1)
Eosinophils Absolute: 0.2 10*3/uL (ref 0.0–0.7)
Eosinophils Relative: 4 %
HEMATOCRIT: 38.6 % (ref 36.0–46.0)
Hemoglobin: 13.1 g/dL (ref 12.0–15.0)
LYMPHS ABS: 2 10*3/uL (ref 0.7–4.0)
Lymphocytes Relative: 40 %
MCH: 32.5 pg (ref 26.0–34.0)
MCHC: 33.9 g/dL (ref 30.0–36.0)
MCV: 95.8 fL (ref 78.0–100.0)
MONOS PCT: 7 %
Monocytes Absolute: 0.3 10*3/uL (ref 0.1–1.0)
NEUTROS ABS: 2.5 10*3/uL (ref 1.7–7.7)
Neutrophils Relative %: 50 %
Platelets: 264 10*3/uL (ref 150–400)
RBC: 4.03 MIL/uL (ref 3.87–5.11)
RDW: 12.2 % (ref 11.5–15.5)
WBC: 5.1 10*3/uL (ref 4.0–10.5)

## 2017-09-24 LAB — COMPREHENSIVE METABOLIC PANEL
ALBUMIN: 4.2 g/dL (ref 3.5–5.0)
ALK PHOS: 60 U/L (ref 38–126)
ALT: 10 U/L — ABNORMAL LOW (ref 14–54)
ANION GAP: 7 (ref 5–15)
AST: 17 U/L (ref 15–41)
BUN: 10 mg/dL (ref 6–20)
CALCIUM: 9.5 mg/dL (ref 8.9–10.3)
CO2: 29 mmol/L (ref 22–32)
Chloride: 104 mmol/L (ref 101–111)
Creatinine, Ser: 0.82 mg/dL (ref 0.44–1.00)
GFR calc Af Amer: >60 mL/min (ref >60)
GFR calc non Af Amer: >60 mL/min (ref >60)
GLUCOSE: 95 mg/dL (ref 65–99)
Potassium: 3.8 mmol/L (ref 3.5–5.1)
SODIUM: 140 mmol/L (ref 135–145)
Total Bilirubin: 1 mg/dL (ref 0.3–1.2)
Total Protein: 7.9 g/dL (ref 6.5–8.1)

## 2017-09-24 LAB — APTT: APTT: 28 s (ref 24–36)

## 2017-09-24 LAB — I-STAT BETA HCG BLOOD, ED (MC, WL, AP ONLY): I-stat hCG, quantitative: 5.3 m[IU]/mL — ABNORMAL HIGH (ref <5)

## 2017-09-24 LAB — PROTIME-INR
INR: 0.96
Prothrombin Time: 12.7 seconds (ref 11.4–15.2)

## 2017-09-24 NOTE — ED Provider Notes (Signed)
Patient placed in Quick Look pathway, seen and evaluated for chief complaint of left sided weakness since yesterday around 1pm.  Pertinent H&P findings include weakness, hx CVA x 2 years ago with craniotomy. Notes headache, nausea, no visual changes, progressive weakness on left side (hx same with previous stroke).  Based on initial evaluation, labs are indicated and radiology studies are indicated.  Patient counseled on process, plan, and necessity for staying for completing the evaluation.     Shary Decamp, PA-C 09/24/17 1322    Pattricia Boss, MD 09/25/17 [phone]

## 2017-09-24 NOTE — ED Provider Notes (Signed)
[facility] EMERGENCY DEPARTMENT Provider Note   CSN: [id number] Arrival date & time: 09/24/17  1213     History   Chief Complaint Chief Complaint  Patient presents with  . Weakness    HPI [name] is a 52 y.o. female.  HPI  Patient presents with multiple concerns. In essence she is largely concerned about possible recurrence of the prior brain lesion. She notes over the past few days she has had nausea, diarrhea, anorexia, as well as an episode of tingling in her right forearm and some headache. Patient's history is most notable for brain abscess, now status post craniotomy, 2 years ago. She is weaning from her narcotic pain medication, and about 1 week ago had an episode of right shoulder pain for which she received a course of steroids. She notes over the past 2 or 3 days in particular she has felt different from normal, with the after mentioned GI issues as well as tingling, and concern for new hyperglycemia in spite of taking all medication as directed. She states that she now feels better than she did earlier today, has no new speech difficulty, no confusion, no disorientation, no focal weakness. She is here with her sister who assists with the HPI.  Past Medical History:  Diagnosis Date  . Asthma   . Chronic knee pain    right  . Meniscus tear   . Perimenopausal   . Rheumatoid arthritis ([place])   . Rotator cuff tear   . Slipped intervertebral disc   . Stroke [facility])     Patient Active Problem List   Diagnosis Date Noted  . Severe frontal headaches 12/06/2015  . Left hemiparesis ([place]) 10/07/2015  . Abscessed tooth 10/07/2015  . Asthma   . Acute blood loss anemia   . 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 Brain lesion 09/29/2015  . TIA (transient ischemic attack) 09/28/2015  . Headache 09/28/2015  . Adjustment disorder with mixed anxiety and depressed mood 07/02/2013  . Depressive disorder 07/03/2012    Past Surgical History:  Procedure Laterality  Date  . APPLICATION OF CRANIAL NAVIGATION Right 10/03/2015   Procedure: APPLICATION OF CRANIAL NAVIGATION;  Surgeon: Consuella Lose, MD;  Location: [place] [facility]URO ORS;  Service: Neurosurgery;  Laterality: Right;  . CESAREAN SECTION    . CRANIOTOMY Right 10/03/2015   Procedure: Stereotactic right frontoparietal craniotomy for excisional biopsy of lesion ;  Surgeon: Consuella Lose, MD;  Location: [place] NEURO ORS;  Service: Neurosurgery;  Laterality: Right;  . OOPHORECTOMY    . SHOULDER SURGERY    . TOOTH EXTRACTION N/A 10/07/2015   Procedure: Extraction of tooth #18 with alveoloplasty;  Surgeon: Lenn Cal, DDS;  Location: [facility];  Service: Oral Surgery;  Laterality: N/A;  . TUBAL LIGATION      OB History    No data available       Home Medications    Prior to Admission medications   Medication Sig Start Date End Date Taking? Authorizing Provider  acetaminophen (TYLENOL) 500 MG tablet Take 1,000 mg by mouth every 6 (six) hours as needed for headache.   Yes [provider]  albuterol (PROVENTIL HFA;VENTOLIN HFA) 108 (90 Base) MCG/ACT inhaler Inhale 2 puffs into the lungs every 6 (six) hours as needed for wheezing or shortness of breath. For wheezing 10/14/15  Yes Angiulli, Lavon Paganini, PA-C  Ascorbic Acid (VITAMIN C) 100 MG tablet Take 250 mg by mouth daily.    Yes [provider]  cholecalciferol (VITAMIN D) 1000  UNITS tablet Take 1,000 Units by mouth daily with breakfast.   Yes [provider]  Ferrous Sulfate (IRON) 325 (65 Fe) MG TABS Take 1 tablet by mouth daily.   Yes [provider]  gabapentin (NEURONTIN) 100 MG capsule Take 1-2 capsules (100-200 mg total) by mouth 3 (three) times daily. 05/23/17  Yes Bayard Hugger, NP  HYDROcodone-acetaminophen (NORCO/VICODIN) 5-325 MG tablet Take 1 tablet by mouth every 6 (six) hours as needed for moderate pain. 09/05/17  Yes Bayard Hugger, NP  metFORMIN (GLUCOPHAGE) 500 MG tablet Take half tablet twice a  day Patient taking differently: Take 500 mg by mouth 2 (two) times daily with a meal. Take half tablet twice a day 09/24/16  Yes Bayard Hugger, NP  Multiple Vitamin (MULTIVITAMIN WITH MINERALS) TABS tablet Take 1 tablet by mouth daily.   Yes [provider]  oxyCODONE-acetaminophen (PERCOCET) 5-325 MG tablet Take 0.5-1 tablets by mouth every 6 (six) hours as needed for severe pain. Partial prescription for one week 09/13/17  Yes Bayard Hugger, NP  traMADol (ULTRAM) 50 MG tablet Take 1 tablet (50 mg total) every 8 (eight) hours as needed by mouth. 08/05/17  Yes Danella Sensing L, NP  VOLTAREN 1 % GEL Apply 1 application topically 4 (four) times daily as needed. 11/09/16  Yes [provider]  ACCU-CHEK AVIVA PLUS test strip USE AS DIRECTED TO TEST BLOOD SUGARS 4 TIMES A DAY 07/09/16   [provider]    Family History Family History  Problem Relation Age of Onset  . Cancer Mother        colon cancer  . Cancer Father        brain cancer  . Alcohol abuse Father   . Diabetes Father     Social History Social History   Tobacco Use  . Smoking status: Never Smoker  . Smokeless tobacco: Never Used  Substance Use Topics  . Alcohol use: No  . Drug use: No     Allergies   Diclofenac sodium   Review of Systems Review of Systems  Constitutional:       Per HPI, otherwise negative  HENT:       Per HPI, otherwise negative  Respiratory:       Per HPI, otherwise negative  Cardiovascular:       Per HPI, otherwise negative  Gastrointestinal: Positive for diarrhea and nausea.  Endocrine:       Negative aside from HPI  Genitourinary:       Neg aside from HPI   Musculoskeletal:       Per HPI, otherwise negative  Skin: Negative.   Neurological: Positive for numbness and headaches. Negative for syncope.     Physical Exam Updated Vital Signs BP 140/82   Pulse 72   Temp 98.6 F (37 C) (Oral)   Resp 16   LMP 09/27/2011   SpO2 98%   Physical Exam   Constitutional: She is oriented to person, place, and time. She appears well-developed and well-nourished. No distress.  HENT:  Head: Normocephalic and atraumatic.  Eyes: Conjunctivae and EOM are normal.  Cardiovascular: Normal rate and regular rhythm.  Pulmonary/Chest: Effort normal and breath sounds normal. No stridor. No respiratory distress.  Abdominal: She exhibits no distension.  Musculoskeletal: She exhibits no edema.  Neurological: She is alert and oriented to person, place, and time. She displays no atrophy and no tremor. No cranial nerve deficit or sensory deficit. She exhibits normal muscle tone. She displays no  seizure activity.  Skin: Skin is warm and dry.  Psychiatric: She has a normal mood and affect.  Nursing note and vitals reviewed.    ED Treatments / Results  Labs (all labs ordered are listed, but only abnormal results are displayed) Labs Reviewed  RAPID URINE DRUG SCREEN, HOSP PERFORMED - Abnormal; Notable for the following components:      Result Value   Opiates POSITIVE (*)    All other components within normal limits  URINALYSIS, ROUTINE W REFLEX MICROSCOPIC - Abnormal; Notable for the following components:   APPearance HAZY (*)    All other components within normal limits  COMPREHENSIVE METABOLIC PANEL - Abnormal; Notable for the following components:   ALT 10 (*)    All other components within normal limits  I-STAT BETA HCG BLOOD, ED ([facility], [facility], AP ONLY) - Abnormal; Notable for the following components:   I-stat hCG, quantitative 5.3 (*)    All other components within normal limits  CBC WITH DIFFERENTIAL/PLATELET  PROTIME-INR  APTT  POC URINE PREG, ED    EKG  EKG Interpretation  Date/Time:  Tuesday September 24 2017 13:16:31 EST Ventricular Rate:  67 PR Interval:  158 QRS Duration: 72 QT Interval:  396 QTC Calculation: 418 R Axis:   22 Text Interpretation:  Normal sinus rhythm Nonspecific T wave abnormality Artifact Abnormal ekg Confirmed by  Carmin Muskrat [phone]) on 09/24/2017 8:24:09 PM       Radiology Ct Head Wo Contrast  Result Date: 09/24/2017 CLINICAL DATA:  Left-sided weakness since 1 p.m. yesterday. History of right frontal craniotomy for brain abscess in January 2017. No reported injury. EXAM: CT HEAD WITHOUT CONTRAST TECHNIQUE: Contiguous axial images were obtained from the base of the skull through the vertex without intravenous contrast. COMPARISON:  11/24/2015 head CT. FINDINGS: Brain: Mild right parietal encephalomalacia. No evidence of parenchymal hemorrhage or extra-axial fluid collection. No mass lesion, mass effect, or midline shift. No CT evidence of acute infarction. Cerebral volume is otherwise age appropriate. No ventriculomegaly. Vascular: No acute abnormality. Skull: No evidence of calvarial fracture. Stable postsurgical changes from superior right parietal craniotomy. Sinuses/Orbits: No fluid levels. Mucous retention cyst versus polyp in the inferior left maxillary sinus. Other:  The mastoid air cells are unopacified. IMPRESSION: 1.  No evidence of acute intracranial abnormality. 2. Mild right parietal encephalomalacia with overlying right parietal craniotomy. 3. Mucous retention cyst versus polyp in the inferior left maxillary sinus. Electronically Signed   By: Ilona Sorrel M.D.   On: 09/24/2017 15:30    Procedures Procedures (including critical care time)  Medications Ordered in ED Medications - No data to display   Initial Impression / Assessment and Plan / ED Course  I have reviewed the triage vital signs and the nursing notes.  Pertinent labs & imaging results that were available during my care of the patient were reviewed by me and considered in my medical decision making (see chart for details).   9:56 PM Patient awake alert, in no distress, aware of all findings.  This generally well-appearing 52 year old female presents with several ongoing issues. Here she is awake, alert, hemodynamically  stable. She has a notable history of prior craniotomy from a brain abscess. Here no evidence for recurrence, no evidence for neutropenia pathology, and no neurologic dysfunction suggesting acute stroke. Patient is a description of recent change of medication, as well as use of prednisone, and ongoing diarrhea suggest likely etiology for her condition, and with otherwise reassuring findings, no evidence for stroke no evidence  for bacteremia, sepsis, no evidence for other infection, patient discharged in stable condition with outpatient follow-up.  Final Clinical Impressions(s) / ED Diagnoses   Final diagnoses:  Weakness     Carmin Muskrat, MD 09/24/17 2156

## 2017-09-24 NOTE — ED Notes (Signed)
Lab work, radiology results and vital signs reviewed, no critical results at this time, no change in acuity indicated.  

## 2017-09-24 NOTE — Discharge Instructions (Signed)
As discussed, your evaluation today has been largely reassuring.  But, it is important that you monitor your condition carefully, and do not hesitate to return to the ED if you develop new, or concerning changes in your condition. ? ?Otherwise, please follow-up with your physician for appropriate ongoing care. ? ?

## 2017-09-24 NOTE — ED Triage Notes (Signed)
Pt reports left sided weakness that started at 1pm yesterday. Pt has hx of stroke with previous left sided weakness. Pt also reports diarrhea as well. No neuro deficits in triage however pt states that it is hard to keep her left leg up

## 2017-09-27 ENCOUNTER — Encounter: Payer: Medicaid Other | Attending: Physical Medicine & Rehabilitation | Admitting: Registered Nurse

## 2017-09-27 ENCOUNTER — Telehealth: Payer: Self-pay | Admitting: Registered Nurse

## 2017-09-27 DIAGNOSIS — G8929 Other chronic pain: Secondary | ICD-10-CM | POA: Insufficient documentation

## 2017-09-27 DIAGNOSIS — R410 Disorientation, unspecified: Secondary | ICD-10-CM | POA: Insufficient documentation

## 2017-09-27 DIAGNOSIS — S83206A Unspecified tear of unspecified meniscus, current injury, right knee, initial encounter: Secondary | ICD-10-CM | POA: Insufficient documentation

## 2017-09-27 DIAGNOSIS — Z833 Family history of diabetes mellitus: Secondary | ICD-10-CM | POA: Insufficient documentation

## 2017-09-27 DIAGNOSIS — R51 Headache: Secondary | ICD-10-CM | POA: Insufficient documentation

## 2017-09-27 DIAGNOSIS — Z800 Family history of malignant neoplasm of digestive organs: Secondary | ICD-10-CM | POA: Insufficient documentation

## 2017-09-27 DIAGNOSIS — Z811 Family history of alcohol abuse and dependence: Secondary | ICD-10-CM | POA: Insufficient documentation

## 2017-09-27 DIAGNOSIS — R531 Weakness: Secondary | ICD-10-CM | POA: Insufficient documentation

## 2017-09-27 DIAGNOSIS — G060 Intracranial abscess and granuloma: Secondary | ICD-10-CM | POA: Insufficient documentation

## 2017-09-27 DIAGNOSIS — J45909 Unspecified asthma, uncomplicated: Secondary | ICD-10-CM | POA: Insufficient documentation

## 2017-09-27 DIAGNOSIS — Z808 Family history of malignant neoplasm of other organs or systems: Secondary | ICD-10-CM | POA: Insufficient documentation

## 2017-09-27 DIAGNOSIS — R42 Dizziness and giddiness: Secondary | ICD-10-CM | POA: Insufficient documentation

## 2017-09-27 DIAGNOSIS — M069 Rheumatoid arthritis, unspecified: Secondary | ICD-10-CM | POA: Insufficient documentation

## 2017-09-27 NOTE — Telephone Encounter (Signed)
Received a call from Ms. Silvio Clayman, she wasn't able to keep her appointment, she had to take her sons to counseling. With her last trip to the Emergency Depart her son thought she was going to pass away, she wanted to take her sons to their counselor to discuss the above matter. She was apologetic and her appointment has been rescheduled for next week, she verbalizes understanding. Ms. Rezabek is a single mother with twin boys one is Autistic, emotional support given.

## 2017-09-27 NOTE — Telephone Encounter (Signed)
Placed a call to Ms. Sleeper to follow up regarding  her Emergency Department visit, note was reviewed. Ms. Jaffer has a scheduled  appointment today, she was a no show. Left message on voice mail. Awaiting a return call.

## 2017-10-02 ENCOUNTER — Encounter: Payer: Self-pay | Admitting: Registered Nurse

## 2017-10-02 ENCOUNTER — Other Ambulatory Visit: Payer: Self-pay

## 2017-10-02 ENCOUNTER — Encounter: Payer: Medicaid Other | Admitting: Registered Nurse

## 2017-10-02 VITALS — BP 139/84 | HR 70

## 2017-10-02 DIAGNOSIS — Z5181 Encounter for therapeutic drug level monitoring: Secondary | ICD-10-CM

## 2017-10-02 DIAGNOSIS — G894 Chronic pain syndrome: Secondary | ICD-10-CM

## 2017-10-02 DIAGNOSIS — Z800 Family history of malignant neoplasm of digestive organs: Secondary | ICD-10-CM | POA: Diagnosis not present

## 2017-10-02 DIAGNOSIS — M069 Rheumatoid arthritis, unspecified: Secondary | ICD-10-CM | POA: Diagnosis not present

## 2017-10-02 DIAGNOSIS — G8929 Other chronic pain: Secondary | ICD-10-CM | POA: Diagnosis present

## 2017-10-02 DIAGNOSIS — R42 Dizziness and giddiness: Secondary | ICD-10-CM | POA: Diagnosis not present

## 2017-10-02 DIAGNOSIS — R51 Headache: Secondary | ICD-10-CM | POA: Diagnosis not present

## 2017-10-02 DIAGNOSIS — G060 Intracranial abscess and granuloma: Secondary | ICD-10-CM | POA: Diagnosis present

## 2017-10-02 DIAGNOSIS — R519 Headache, unspecified: Secondary | ICD-10-CM

## 2017-10-02 DIAGNOSIS — Z79899 Other long term (current) drug therapy: Secondary | ICD-10-CM | POA: Diagnosis not present

## 2017-10-02 DIAGNOSIS — Z808 Family history of malignant neoplasm of other organs or systems: Secondary | ICD-10-CM | POA: Diagnosis not present

## 2017-10-02 DIAGNOSIS — J45909 Unspecified asthma, uncomplicated: Secondary | ICD-10-CM | POA: Diagnosis not present

## 2017-10-02 DIAGNOSIS — R531 Weakness: Secondary | ICD-10-CM | POA: Diagnosis not present

## 2017-10-02 DIAGNOSIS — Z811 Family history of alcohol abuse and dependence: Secondary | ICD-10-CM | POA: Diagnosis not present

## 2017-10-02 DIAGNOSIS — R410 Disorientation, unspecified: Secondary | ICD-10-CM | POA: Diagnosis not present

## 2017-10-02 DIAGNOSIS — Z833 Family history of diabetes mellitus: Secondary | ICD-10-CM | POA: Diagnosis not present

## 2017-10-02 DIAGNOSIS — S83206A Unspecified tear of unspecified meniscus, current injury, right knee, initial encounter: Secondary | ICD-10-CM | POA: Diagnosis not present

## 2017-10-02 MED ORDER — HYDROCODONE-ACETAMINOPHEN 5-325 MG PO TABS: 1.0000 | ORAL_TABLET | Freq: Four times a day (QID) | ORAL | 0 refills | Status: DC | PRN

## 2017-10-02 NOTE — Progress Notes (Signed)
Subjective:    Patient ID: [name], female    DOB: 02-20-1966, 51 y.o.   MRN: [id number]  HPI: [name] is a 52 year old female who returns for follow up appointmentfor chronicpain and medication refill. She states she has a  Headache. She rates her pain 7. Her current exercise regime is walking.  [name] has weaned off the oxycodone and has tolerated the hydrocodone, she reports there were two days when her headache had increased in intensity and she she took 11/2 tablets of her Hydrocodone with relief noted. We will continue the Hydrocodone and increase her tablets to #50, this will allow her to take 11/2 tablets when the pain is sever,she verbalizes understanding.   [name] has two  Goals one is to tolerate the hydrocodone and eventually be weaned off and prescribed Tramadol. Her second goal is to be  weaned off opioids permanently.   On 09/24/2017 [name] went to [facility] Emergency Department to be evaluated for weakness, note was reviewed.    [name] Morphine equivalent is  43.33  MME.    Her last UDS 01/15/2017, inconsistent for Oxycodone.    Pain Inventory Average Pain 6 Pain Right Now 7 My pain is dull and aching  In the last 24 hours, has pain interfered with the following? General activity 10 Relation with others 10 Enjoyment of life 10 What TIME of day is your pain at its worst? morning evening and night Sleep (in general) Poor  Pain is worse with: walking and some activites Pain improves with: medication Relief from Meds: 5  Mobility walk without assistance ability to climb steps?  yes do you drive?  yes Do you have any goals in this area?  no  Function Do you have any goals in this area?  no  Neuro/Psych No problems in this area  Prior Studies Any changes since last visit?  no  Physicians involved in your care Any changes since last visit?  no   Family History  Problem Relation Age of Onset  . Cancer Mother    colon cancer  . Cancer Father        brain cancer  . Alcohol abuse Father   . Diabetes Father    Social History   Socioeconomic History  . Marital status: Divorced    Spouse name: None  . Number of children: None  . Years of education: None  . Highest education level: None  Social Needs  . Financial resource strain: None  . Food insecurity - worry: None  . Food insecurity - inability: None  . Transportation needs - medical: None  . Transportation needs - non-medical: None  Occupational History  . None  Tobacco Use  . Smoking status: Never Smoker  . Smokeless tobacco: Never Used  Substance and Sexual Activity  . Alcohol use: No  . Drug use: No  . Sexual activity: Not Currently  Other Topics Concern  . None  Social History Narrative  . None   Past Surgical History:  Procedure Laterality Date  . APPLICATION OF CRANIAL NAVIGATION Right 10/03/2015   Procedure: APPLICATION OF CRANIAL NAVIGATION;  Surgeon: Consuella Lose, MD;  Location: [place] NEUR[facility] ORS;  Service: Neurosurgery;  Laterality: Right;  . CESAREAN SECTION    . CRANIOTOMY Right 10/03/2015   Procedure: Stereotactic right frontoparietal craniotomy for excisional biopsy of lesion ;  Surgeon: Consuella Lose, MD;  Location: [place] NEURO ORS;  Service: Neurosurgery;  Laterality: Right;  . OOPHORECTOMY    .  SHOULDER SURGERY    . TOOTH EXTRACTION N/A 10/07/2015   Procedure: Extraction of tooth #18 with alveoloplasty;  Surgeon: Lenn Cal, DDS;  Location: [facility];  Service: Oral Surgery;  Laterality: N/A;  . TUBAL LIGATION     Past Medical History:  Diagnosis Date  . Asthma   . Chronic knee pain    right  . Meniscus tear   . Perimenopausal   . Rheumatoid arthritis ([place])   . Rotator cuff tear   . Slipped intervertebral disc   . Stroke ([place])    BP 139/84   Pulse 70   LMP 09/27/2011   SpO2 95%   Opioid Risk Score:  2 Fall Risk Score:  `1  Depression screen PHQ 2/9  Depression screen [facility] 2/9 10/02/2017  09/05/2017 07/17/2017 06/18/2017 05/23/2017 12/04/2016 09/24/2016  Decreased Interest 0 0 0 0 3 1 1   Down, Depressed, Hopeless 0 0 0 0 3 1 1   PHQ - 2 Score 0 0 0 0 6 2 2   Altered sleeping - - - - - - -  Tired, decreased energy - - - - - - -  Change in appetite - - - - - - -  Feeling bad or failure about yourself  - - - - - - -  Trouble concentrating - - - - - - -  Moving slowly or fidgety/restless - - - - - - -  Suicidal thoughts - - - - - - -  PHQ-9 Score - - - - - - -  Difficult doing work/chores - - - - - - -    Review of Systems  Constitutional: Negative.   HENT: Negative.   Eyes: Negative.   Respiratory: Negative.   Cardiovascular: Negative.   Gastrointestinal: Negative.   Endocrine: Negative.   Genitourinary: Negative.   Musculoskeletal: Positive for neck pain and neck stiffness.  Allergic/Immunologic: Negative.   Neurological: Negative.   Hematological: Negative.   Psychiatric/Behavioral: Negative.   All other systems reviewed and are negative.      Objective:   Physical Exam  Constitutional: She is oriented to person, place, and time. She appears well-developed and well-nourished.  HENT:  Head: Normocephalic and atraumatic.  Neck: Normal range of motion. Neck supple.  Cardiovascular: Normal rate and regular rhythm.  Pulmonary/Chest: Effort normal and breath sounds normal.  Musculoskeletal:  Normal Muscle Bulk and Muscle Testing Reveals: Upper Extremities: Full  ROM and Muscle Strength 5/5 Lower Extremities: Full ROM and Muscle Strength 5/5 Arises from chair with ease Narrow Based Gait    Neurological: She is alert and oriented to person, place, and time.  Skin: Skin is warm and dry.  Psychiatric: She has a normal mood and affect.  Nursing note and vitals reviewed.         Assessment & Plan:  1. 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 10/02/2017 2. Headaches: Refilled:Hydrocodone 1 tablet every every  6 hours, she was given permission to take 11/2 tablets when pain is sever. Oxycodone discontinued.  We will continue the opioid monitoring program, this consists of regular clinic visits, examinations, urine drug screen, pill counts as well as use of [place]  3. Chronic Pain Syndrome: Continue Gabapentin .10/02/2017 4. Left Knee Pain: No complaints today.Dr. Noemi Chapel Following. 10/02/2017  30 minutes of face to face patient care time was spent during this visit. All questions were encouraged and answered.  Follow up in 1 month

## 2017-10-04 ENCOUNTER — Telehealth: Payer: Self-pay | Admitting: *Deleted

## 2017-10-04 NOTE — Telephone Encounter (Signed)
Prior auth intiated with [place] [employer] for hydrocodone 5/325 #50 .

## 2017-10-07 NOTE — Telephone Encounter (Signed)
Prior auth approved 10/05/17-04/03/18  Confirmation #:[id number] .[place] notified.

## 2017-10-10 ENCOUNTER — Telehealth: Payer: Self-pay

## 2017-10-10 MED ORDER — HYDROCODONE-ACETAMINOPHEN 5-325 MG PO TABS: 1.0000 | ORAL_TABLET | Freq: Four times a day (QID) | ORAL | 0 refills | Status: DC | PRN

## 2017-10-10 NOTE — Telephone Encounter (Signed)
Ms. Mcreynolds, [place] was perfomed, according to the PMP site she was able to receive 28 tablets. She will pick up new prescription for the remainder of tablets. Ms. Morel is aware of the above, she will pick up prescription today.

## 2017-10-10 NOTE — Telephone Encounter (Signed)
Patient called today, states is in need of new prescription according to her pharmacy, states she was only able to fill half due to need of a prior auth, which is completed and approved, states pharmacy told her she needs new paper script to fill remainder of prescription and can be by today to pick it up.

## 2017-10-23 ENCOUNTER — Telehealth: Payer: Self-pay | Admitting: Registered Nurse

## 2017-10-23 MED ORDER — OXYCODONE-ACETAMINOPHEN 5-325 MG PO TABS: 1.0000 | ORAL_TABLET | Freq: Two times a day (BID) | ORAL | 0 refills | Status: DC | PRN

## 2017-10-23 NOTE — Telephone Encounter (Signed)
Received a call from [name], she reports increase intensity and frequency of headaches. Hydrocodone is ineffective. In the past she was prescribed Depakote adverse reaction with diarrhea. Also when she was on Topamax she experienced daytime drowsiness. We will resume her Oxycodone  5 mg BID as needed for pain. PMP Aware Site was reviewed, Hydrocodone was picked up on 10/11/2017 MM# 20.50. Oxycodone 5 mg BID her MME will be 15.00 MME. [name] is aware of the above. Oxycodone e-scribed. I place a call and spoke to the Pharmacist regarding the above.

## 2017-10-25 ENCOUNTER — Telehealth: Payer: Self-pay

## 2017-10-25 ENCOUNTER — Telehealth: Payer: Self-pay | Admitting: Registered Nurse

## 2017-10-25 NOTE — Telephone Encounter (Signed)
Prior auth started today for oxycodone-apap 5-325mg 

## 2017-10-25 NOTE — Telephone Encounter (Signed)
[name] states the pharmacy only dispense one week of her Oxycodone. Called the pharmacy. [name] needs a PA, this will be done today. She is aware of the above.

## 2017-10-28 ENCOUNTER — Encounter: Payer: Medicaid Other | Attending: Physical Medicine & Rehabilitation | Admitting: Registered Nurse

## 2017-10-28 ENCOUNTER — Encounter: Payer: Self-pay | Admitting: Registered Nurse

## 2017-10-28 ENCOUNTER — Other Ambulatory Visit: Payer: Self-pay

## 2017-10-28 VITALS — BP 135/82 | HR 73

## 2017-10-28 DIAGNOSIS — S83206A Unspecified tear of unspecified meniscus, current injury, right knee, initial encounter: Secondary | ICD-10-CM | POA: Diagnosis not present

## 2017-10-28 DIAGNOSIS — R410 Disorientation, unspecified: Secondary | ICD-10-CM | POA: Insufficient documentation

## 2017-10-28 DIAGNOSIS — J45909 Unspecified asthma, uncomplicated: Secondary | ICD-10-CM | POA: Insufficient documentation

## 2017-10-28 DIAGNOSIS — Z811 Family history of alcohol abuse and dependence: Secondary | ICD-10-CM | POA: Diagnosis not present

## 2017-10-28 DIAGNOSIS — R51 Headache: Secondary | ICD-10-CM | POA: Insufficient documentation

## 2017-10-28 DIAGNOSIS — Z800 Family history of malignant neoplasm of digestive organs: Secondary | ICD-10-CM | POA: Insufficient documentation

## 2017-10-28 DIAGNOSIS — Z79891 Long term (current) use of opiate analgesic: Secondary | ICD-10-CM

## 2017-10-28 DIAGNOSIS — G894 Chronic pain syndrome: Secondary | ICD-10-CM | POA: Diagnosis not present

## 2017-10-28 DIAGNOSIS — R42 Dizziness and giddiness: Secondary | ICD-10-CM | POA: Insufficient documentation

## 2017-10-28 DIAGNOSIS — R531 Weakness: Secondary | ICD-10-CM | POA: Insufficient documentation

## 2017-10-28 DIAGNOSIS — M069 Rheumatoid arthritis, unspecified: Secondary | ICD-10-CM | POA: Diagnosis not present

## 2017-10-28 DIAGNOSIS — G8929 Other chronic pain: Secondary | ICD-10-CM | POA: Diagnosis present

## 2017-10-28 DIAGNOSIS — Z5181 Encounter for therapeutic drug level monitoring: Secondary | ICD-10-CM

## 2017-10-28 DIAGNOSIS — R519 Headache, unspecified: Secondary | ICD-10-CM

## 2017-10-28 DIAGNOSIS — Z833 Family history of diabetes mellitus: Secondary | ICD-10-CM | POA: Diagnosis not present

## 2017-10-28 DIAGNOSIS — G060 Intracranial abscess and granuloma: Secondary | ICD-10-CM

## 2017-10-28 DIAGNOSIS — Z808 Family history of malignant neoplasm of other organs or systems: Secondary | ICD-10-CM | POA: Insufficient documentation

## 2017-10-28 MED ORDER — OXYCODONE-ACETAMINOPHEN 5-325 MG PO TABS: 1.0000 | ORAL_TABLET | Freq: Two times a day (BID) | ORAL | 0 refills | Status: DC | PRN

## 2017-10-28 NOTE — Progress Notes (Signed)
Subjective:    Patient ID: [name], female    DOB: 02-Mar-1966, 52 y.o.   MRN: [id number]  HPI: [name] is a 52 year old female who returns for follow up appointmentfor chronicpain and medication refill. She states her pain is located in her head ( Headache), she has noticed when she watches TV for long period of time or use the computer for a long period of time her pain increases in intensity. Due to the frequency and intensity of headaches her hydrocodone was switched to Oxycodone. The oxycodone manages her pain she reports. We will continue the Oxycodone at this time and re-evaluate, she verbalizes understanding. Also encouraged to schedule an opthomology appointment she verbalizes understanding.  She rates her pain 5. Her current exercise regime is walking.    On 09/24/2017 [name] went to [facility] Emergency Department to be evaluated for weakness, note was reviewed.    [name] Morphine equivalent is  19.67   MME.    Her last UDS 01/15/2017, inconsistent for Oxycodone.    Pain Inventory Average Pain 6 Pain Right Now 5 My pain is aching  In the last 24 hours, has pain interfered with the following? General activity 7 Relation with others 7 Enjoyment of life 7 What TIME of day is your pain at its worst? morning evening and night Sleep (in general) Poor  Pain is worse with: walking and some activites Pain improves with: medication Relief from Meds: 5  Mobility walk without assistance ability to climb steps?  yes do you drive?  yes Do you have any goals in this area?  no  Function Do you have any goals in this area?  no  Neuro/Psych No problems in this area  Prior Studies Any changes since last visit?  no  Physicians involved in your care Any changes since last visit?  no   Family History  Problem Relation Age of Onset  . Cancer Mother        colon cancer  . Cancer Father        brain cancer  . Alcohol abuse Father   . Diabetes  Father    Social History   Socioeconomic History  . Marital status: Divorced    Spouse name: Not on file  . Number of children: Not on file  . Years of education: Not on file  . Highest education level: Not on file  Social Needs  . Financial resource strain: Not on file  . Food insecurity - worry: Not on file  . Food insecurity - inability: Not on file  . Transportation needs - medical: Not on file  . Transportation needs - non-medical: Not on file  Occupational History  . Not on file  Tobacco Use  . Smoking status: Never Smoker  . Smokeless tobacco: Never Used  Substance and Sexual Activity  . Alcohol use: No  . Drug use: No  . Sexual activity: Not Currently  Other Topics Concern  . Not on file  Social History Narrative  . Not on file   Past Surgical History:  Procedure Laterality Date  . APPLICATION OF CRANIAL NAVIGATION Right 10/03/2015   Procedure: APPLICATION OF CRANIAL NAVIGATION;  Surgeon: Consuella Lose, MD;  Location: [place] [facility];  Service: Neurosurgery;  Laterality: Right;  . CESAREAN SECTION    . CRANIOTOMY Right 10/03/2015   Procedure: Stereotactic right frontoparietal craniotomy for excisional biopsy of lesion ;  Surgeon: Consuella Lose, MD;  Location: [facility];  Service:  Neurosurgery;  Laterality: Right;  . OOPHORECTOMY    . SHOULDER SURGERY    . TOOTH EXTRACTION N/A 10/07/2015   Procedure: Extraction of tooth #18 with alveoloplasty;  Surgeon: Lenn Cal, DDS;  Location: [place];  Service: Oral Surgery;  Laterality: N/A;  . TUBAL LIGATION     Past Medical History:  Diagnosis Date  . Asthma   . Chronic knee pain    right  . Meniscus tear   . Perimenopausal   . Rheumatoid arthritis ([place])   . Rotator cuff tear   . Slipped intervertebral disc   . Stroke ([place])    BP (!) 145/86 (BP Location: Left Arm, Patient Position: Sitting, Cuff Size: Normal)   Pulse 73   LMP 09/27/2011   SpO2 95%   Opioid Risk Score:  2 Fall Risk Score:   `1  Depression screen PHQ 2/9  Depression screen [facility] 2/9 10/28/2017 10/02/2017 09/05/2017 07/17/2017 06/18/2017 05/23/2017 12/04/2016  Decreased Interest 0 0 0 0 0 3 1  Down, Depressed, Hopeless 0 0 0 0 0 3 1  PHQ - 2 Score 0 0 0 0 0 6 2  Altered sleeping - - - - - - -  Tired, decreased energy - - - - - - -  Change in appetite - - - - - - -  Feeling bad or failure about yourself  - - - - - - -  Trouble concentrating - - - - - - -  Moving slowly or fidgety/restless - - - - - - -  Suicidal thoughts - - - - - - -  PHQ-9 Score - - - - - - -  Difficult doing work/chores - - - - - - -    Review of Systems  Constitutional: Negative.   HENT: Negative.   Eyes: Negative.   Respiratory: Negative.   Cardiovascular: Negative.   Gastrointestinal: Negative.   Endocrine: Negative.   Genitourinary: Negative.   Musculoskeletal: Positive for neck pain and neck stiffness.  Allergic/Immunologic: Negative.   Neurological: Negative.   Hematological: Negative.   Psychiatric/Behavioral: Negative.   All other systems reviewed and are negative.      Objective:   Physical Exam  Constitutional: She is oriented to person, place, and time. She appears well-developed and well-nourished.  HENT:  Head: Normocephalic and atraumatic.  Neck: Normal range of motion. Neck supple.  Cardiovascular: Normal rate and regular rhythm.  Pulmonary/Chest: Effort normal and breath sounds normal.  Musculoskeletal:  Normal Muscle Bulk and Muscle Testing Reveals: Upper Extremities: Full  ROM and Muscle Strength 5/5 Thoracic Paraspinal Tenderness: T-1-T-3  Lower Extremities: Full ROM and Muscle Strength 5/5 Arises from chair with ease Narrow Based Gait    Neurological: She is alert and oriented to person, place, and time.  Skin: Skin is warm and dry.  Psychiatric: She has a normal mood and affect.  Nursing note and vitals reviewed.         Assessment & Plan:  1. 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 10/28/2017 2. Headaches: Refilled: Oxycodone  5/325 mg one tablet twice a day as needed for moderate pain. #60. 10/28/2017 We will continue the opioid monitoring program, this consists of regular clinic visits, examinations, urine drug screen, pill counts as well as use of North  3. Chronic Pain Syndrome: Continue Gabapentin .10/28/2017 4. Left Knee Pain: No complaints today.Dr. Noemi Chapel Following. 10/28/2017  20 minutes of face to face patient care time was  spent during this visit. All questions were encouraged and answered.  Follow up in 1 month

## 2017-11-02 LAB — TOXASSURE SELECT,+ANTIDEPR,UR

## 2017-11-04 ENCOUNTER — Telehealth: Payer: Self-pay | Admitting: *Deleted

## 2017-11-04 NOTE — Telephone Encounter (Signed)
Urine drug screen for this encounter is consistent for prescribed medication 

## 2017-11-08 ENCOUNTER — Other Ambulatory Visit: Payer: Self-pay | Admitting: *Deleted

## 2017-11-08 DIAGNOSIS — Z79899 Other long term (current) drug therapy: Secondary | ICD-10-CM

## 2017-11-08 DIAGNOSIS — Z5181 Encounter for therapeutic drug level monitoring: Secondary | ICD-10-CM

## 2017-11-08 DIAGNOSIS — G8194 Hemiplegia, unspecified affecting left nondominant side: Secondary | ICD-10-CM

## 2017-11-08 DIAGNOSIS — R51 Headache: Secondary | ICD-10-CM

## 2017-11-08 DIAGNOSIS — R519 Headache, unspecified: Secondary | ICD-10-CM

## 2017-11-08 DIAGNOSIS — G894 Chronic pain syndrome: Secondary | ICD-10-CM

## 2017-11-08 DIAGNOSIS — G939 Disorder of brain, unspecified: Secondary | ICD-10-CM

## 2017-11-08 MED ORDER — GABAPENTIN 100 MG PO CAPS: 100.0000 mg | ORAL_CAPSULE | Freq: Three times a day (TID) | ORAL | 3 refills | Status: AC

## 2017-11-12 ENCOUNTER — Telehealth: Payer: Self-pay | Admitting: Registered Nurse
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
## 2017-11-14 ENCOUNTER — Telehealth: Payer: Self-pay | Admitting: Physical Medicine & Rehabilitation

## 2017-11-14 MED ORDER — OXYCODONE-ACETAMINOPHEN 5-325 MG PO TABS: 1.0000 | ORAL_TABLET | Freq: Three times a day (TID) | ORAL | 0 refills | Status: DC | PRN
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
## 2017-11-14 NOTE — Telephone Encounter (Signed)
Pt phoned requesting a call back. Please advise.

## 2017-11-25 ENCOUNTER — Encounter: Payer: Medicaid Other | Admitting: Physical Medicine & Rehabilitation

## 2017-11-27 ENCOUNTER — Encounter: Payer: Medicaid Other | Admitting: Physical Medicine & Rehabilitation

## 2017-11-28 ENCOUNTER — Telehealth: Payer: Self-pay | Admitting: Physical Medicine & Rehabilitation

## 2017-11-28 NOTE — Telephone Encounter (Signed)
Patient contacted office 03.13.19 expressing concern about MD Ptn relationship would like to change physicians - advised nothing in our practice we could do but would speak to MD and determine solution for her.  Phoned home 03.14.19 - left message still working on plan for her

## 2017-12-04 ENCOUNTER — Telehealth: Payer: Self-pay | Admitting: Physical Medicine & Rehabilitation

## 2017-12-04 NOTE — Telephone Encounter (Signed)
Phoned patient to advise have a primary care that is willing to take referral from [place] but could not RX Oxy long term-- she states she likes her primary care at [facility].  I suggested [facility] [address] for pain mngt follow up.  Explained PCP cannot RX long term narcotics and referral makes it difficult but ZS is willing to help however he can, as she needs to possibly leave practice.

## 2017-12-16 ENCOUNTER — Encounter: Payer: Self-pay | Admitting: Physical Medicine & Rehabilitation

## 2017-12-16 ENCOUNTER — Telehealth: Payer: Self-pay | Admitting: Physical Medicine & Rehabilitation

## 2017-12-16 MED ORDER — TRAMADOL HCL 50 MG PO TABS: 50.0000 mg | ORAL_TABLET | Freq: Two times a day (BID) | ORAL | 1 refills | Status: DC | PRN

## 2017-12-16 MED ORDER — OXYCODONE-ACETAMINOPHEN 5-325 MG PO TABS: 1.0000 | ORAL_TABLET | Freq: Three times a day (TID) | ORAL | 0 refills | Status: DC | PRN

## 2017-12-16 NOTE — Telephone Encounter (Signed)
Return  [name] call, she reports she left [name] a message stating she would like to resume seeing Dr. Naaman Plummer. This provider spoke with Dr. Naaman Plummer, since [name] is on vacation.The following are Dr Naaman Plummer. recommendations:  [name] may obtain another physiatrist we will provide her list on her next schedule visit on 12/25/2017, he will not resume seeing her. Also this provider will  continue to see her over the next three months while  weaning her oxycodone, she verbalizes understanding. [name] would like to be weaned off the oxycodone. We will e-scribe her Oxycodone today and continue current dose with a goal to decrease to twice as day. Also instructed to use Tramadol 1- 2 tablets  daily as needed. She picked up her Tramadol on 12/08/2017. Her Oxycodone was picked up on 11/15/2017, according to the PMP Aware Web-site. She was instructed to keep a Pain journal and document her medication, she verbalizes understanding.  In the past she was prescribe Depakote and Topamax due to adverse effect of somnolence the medications was discontinued.

## 2017-12-16 NOTE — Telephone Encounter (Signed)
Patient is requesting a phone call from [place] about maybe changing her medications.  Please call patient.

## 2017-12-25 ENCOUNTER — Encounter: Payer: Medicaid Other | Admitting: Registered Nurse

## 2017-12-31 ENCOUNTER — Encounter: Payer: Self-pay | Admitting: Registered Nurse

## 2017-12-31 ENCOUNTER — Encounter: Payer: Medicaid Other | Attending: Physical Medicine & Rehabilitation | Admitting: Registered Nurse

## 2017-12-31 VITALS — BP 155/90 | HR 73 | Ht 70.0 in | Wt 260.0 lb

## 2017-12-31 DIAGNOSIS — M069 Rheumatoid arthritis, unspecified: Secondary | ICD-10-CM | POA: Diagnosis not present

## 2017-12-31 DIAGNOSIS — M25561 Pain in right knee: Secondary | ICD-10-CM

## 2017-12-31 DIAGNOSIS — Z833 Family history of diabetes mellitus: Secondary | ICD-10-CM | POA: Diagnosis not present

## 2017-12-31 DIAGNOSIS — Z808 Family history of malignant neoplasm of other organs or systems: Secondary | ICD-10-CM | POA: Insufficient documentation

## 2017-12-31 DIAGNOSIS — G8929 Other chronic pain: Secondary | ICD-10-CM | POA: Diagnosis present

## 2017-12-31 DIAGNOSIS — Z811 Family history of alcohol abuse and dependence: Secondary | ICD-10-CM | POA: Insufficient documentation

## 2017-12-31 DIAGNOSIS — Z79891 Long term (current) use of opiate analgesic: Secondary | ICD-10-CM | POA: Diagnosis not present

## 2017-12-31 DIAGNOSIS — R51 Headache: Secondary | ICD-10-CM | POA: Diagnosis not present

## 2017-12-31 DIAGNOSIS — J45909 Unspecified asthma, uncomplicated: Secondary | ICD-10-CM | POA: Diagnosis not present

## 2017-12-31 DIAGNOSIS — R42 Dizziness and giddiness: Secondary | ICD-10-CM | POA: Diagnosis not present

## 2017-12-31 DIAGNOSIS — R531 Weakness: Secondary | ICD-10-CM | POA: Diagnosis not present

## 2017-12-31 DIAGNOSIS — M25562 Pain in left knee: Secondary | ICD-10-CM | POA: Diagnosis not present

## 2017-12-31 DIAGNOSIS — R519 Headache, unspecified: Secondary | ICD-10-CM

## 2017-12-31 DIAGNOSIS — S83206A Unspecified tear of unspecified meniscus, current injury, right knee, initial encounter: Secondary | ICD-10-CM | POA: Insufficient documentation

## 2017-12-31 DIAGNOSIS — Z5181 Encounter for therapeutic drug level monitoring: Secondary | ICD-10-CM

## 2017-12-31 DIAGNOSIS — R410 Disorientation, unspecified: Secondary | ICD-10-CM | POA: Diagnosis not present

## 2017-12-31 DIAGNOSIS — Z800 Family history of malignant neoplasm of digestive organs: Secondary | ICD-10-CM | POA: Insufficient documentation

## 2017-12-31 DIAGNOSIS — G060 Intracranial abscess and granuloma: Secondary | ICD-10-CM | POA: Diagnosis present

## 2017-12-31 DIAGNOSIS — G894 Chronic pain syndrome: Secondary | ICD-10-CM | POA: Diagnosis not present

## 2017-12-31 MED ORDER — OXYCODONE-ACETAMINOPHEN 5-325 MG PO TABS: 1.0000 | ORAL_TABLET | Freq: Three times a day (TID) | ORAL | 0 refills | Status: DC | PRN

## 2017-12-31 NOTE — Progress Notes (Signed)
Subjective:    Patient ID: [name], female    DOB: 1966/09/09, 52 y.o.   MRN: [id number]  HPI: [name] is a 52 year old female who returns for follow up appointment and medication refill. She states her pain is located in her head  ( headache), bilateral knees and reports generalized body aches. She rates her pain 8. Her current exercise regime is walking. She is tolerating the slow wean of Oxycodone, today we will decrease her tablets to #60, she verbalizes understanding.  [name] forgot her Oxycodone medication bottle, according to the PMP Aware Web-site she picked up her Oxycodone medication on 12/16/2017, narcotic contract reviewed, she verbalizes understanding.   [name] Morphine Equivalent is 33.25 MME.    Pain Inventory Average Pain 7 Pain Right Now 8 My pain is aching  In the last 24 hours, has pain interfered with the following? General activity 8 Relation with others 8 Enjoyment of life 0 What TIME of day is your pain at its worst? night Sleep (in general) Fair  Pain is worse with: inactivity Pain improves with: rest, heat/ice, therapy/exercise and medication Relief from Meds: .  Mobility Do you have any goals in this area?  no  Function Do you have any goals in this area?  no  Neuro/Psych No problems in this area  Prior Studies Any changes since last visit?  no  Physicians involved in your care Any changes since last visit?  no   Family History  Problem Relation Age of Onset  . Cancer Mother        colon cancer  . Cancer Father        brain cancer  . Alcohol abuse Father   . Diabetes Father    Social History   Socioeconomic History  . Marital status: Divorced    Spouse name: Not on file  . Number of children: Not on file  . Years of education: Not on file  . Highest education level: Not on file  Occupational History  . Not on file  Social Needs  . Financial resource strain: Not on file  . Food insecurity:    Worry: Not on  file    Inability: Not on file  . Transportation needs:    Medical: Not on file    Non-medical: Not on file  Tobacco Use  . Smoking status: Never Smoker  . Smokeless tobacco: Never Used  Substance and Sexual Activity  . Alcohol use: No  . Drug use: No  . Sexual activity: Not Currently  Lifestyle  . Physical activity:    Days per week: Not on file    Minutes per session: Not on file  . Stress: Not on file  Relationships  . Social connections:    Talks on phone: Not on file    Gets together: Not on file    Attends religious service: Not on file    Active member of club or organization: Not on file    Attends meetings of clubs or organizations: Not on file    Relationship status: Not on file  Other Topics Concern  . Not on file  Social History Narrative  . Not on file   Past Surgical History:  Procedure Laterality Date  . APPLICATION OF CRANIAL NAVIGATION Right 10/03/2015   Procedure: APPLICATION OF CRANIAL NAVIGATION;  Surgeon: Consuella Lose, MD;  Location: [place] [facility];  Service: Neurosurgery;  Laterality: Right;  . CESAREAN SECTION    . CRANIOTOMY Right 10/03/2015  Procedure: Stereotactic right frontoparietal craniotomy for excisional biopsy of lesion ;  Surgeon: Consuella Lose, MD;  Location: [place] [facility];  Service: Neurosurgery;  Laterality: Right;  . OOPHORECTOMY    . SHOULDER SURGERY    . TOOTH EXTRACTION N/A 10/07/2015   Procedure: Extraction of tooth #18 with alveoloplasty;  Surgeon: Lenn Cal, DDS;  Location: [place];  Service: Oral Surgery;  Laterality: N/A;  . TUBAL LIGATION     Past Medical History:  Diagnosis Date  . Asthma   . Chronic knee pain    right  . Meniscus tear   . Perimenopausal   . Rheumatoid arthritis ([place])   . Rotator cuff tear   . Slipped intervertebral disc   . Stroke ([place])    BP (!) 155/90   Pulse 73   Ht 5\' 10"  (1.778 m)   Wt 260 lb (117.9 kg)   LMP 09/27/2011   SpO2 97%   BMI 37.31 kg/m   Opioid Risk Score:     Fall Risk Score:  `1  Depression screen PHQ 2/9  Depression screen [facility] 2/9 10/28/2017 10/02/2017 09/05/2017 07/17/2017 06/18/2017 05/23/2017 12/04/2016  Decreased Interest 0 0 0 0 0 3 1  Down, Depressed, Hopeless 0 0 0 0 0 3 1  PHQ - 2 Score 0 0 0 0 0 6 2  Altered sleeping - - - - - - -  Tired, decreased energy - - - - - - -  Change in appetite - - - - - - -  Feeling bad or failure about yourself  - - - - - - -  Trouble concentrating - - - - - - -  Moving slowly or fidgety/restless - - - - - - -  Suicidal thoughts - - - - - - -  PHQ-9 Score - - - - - - -  Difficult doing work/chores - - - - - - -     Review of Systems  Constitutional: Negative.   HENT: Negative.   Eyes: Negative.   Respiratory: Negative.   Cardiovascular: Negative.   Gastrointestinal: Negative.   Endocrine: Negative.   Genitourinary: Negative.   Musculoskeletal: Negative.   Skin: Negative.   Allergic/Immunologic: Negative.   Neurological: Positive for headaches.  Hematological: Negative.   Psychiatric/Behavioral: Negative.   All other systems reviewed and are negative.      Objective:   Physical Exam  Constitutional: She is oriented to person, place, and time. She appears well-developed and well-nourished.  HENT:  Head: Normocephalic and atraumatic.  Neck: Normal range of motion. Neck supple.  Cardiovascular: Normal rate and regular rhythm.  Pulmonary/Chest: Effort normal and breath sounds normal.  Musculoskeletal:  Normal Muscle Bulk and Muscle Testing Reveals: Upper Extremities: Full ROM and Muscle Strength 5/5 Back without spinal tenderness Lower Extremities: Full ROM and Muscle Strength 5/5 Arises from chair with ease Narrow Based Gait  Neurological: She is alert and oriented to person, place, and time.  Skin: Skin is warm and dry.  Psychiatric: She has a normal mood and affect.  Nursing note and vitals reviewed.         Assessment & Plan:  1. Left-sided weakness and headache secondary  to brain abscess : Continue with HEP as tolerated. 12/31/2017 2. Headaches: Refilled: Oxycodone  5/325 mg one tablet every 8 hours as needed for moderate pain. #60 and continue Tramadol  50 mg  One tablet twice a day as needed #60. 12/31/2017 We will continue the opioid monitoring program, this consists  of regular clinic visits, examinations, urine drug screen, pill counts as well as use of North  3. Chronic Pain Syndrome: Continue current medication regimen with Gabapentin .12/31/2017 4. Bilateral Knee Pain: Continue HEP as  Tolerated. Dr. Noemi Chapel Following. 12/31/2017  20 minutes of face to face patient care time was spent during this visit. All questions were encouraged and answered.  Follow up in 1 month

## 2018-01-14 ENCOUNTER — Ambulatory Visit: Payer: Medicaid Other | Admitting: Gastroenterology

## 2018-01-14 ENCOUNTER — Encounter: Payer: Self-pay | Admitting: Gastroenterology

## 2018-01-14 VITALS — BP 136/86 | HR 72 | Ht 69.0 in | Wt 263.1 lb

## 2018-01-14 DIAGNOSIS — Z800 Family history of malignant neoplasm of digestive organs: Secondary | ICD-10-CM

## 2018-01-14 MED ORDER — NA SULFATE-K SULFATE-MG SULF 17.5-3.13-1.6 GM/177ML PO SOLN: ORAL | 0 refills | Status: DC

## 2018-01-14 NOTE — Patient Instructions (Signed)
You have been scheduled for a colonoscopy. Please follow written instructions given to you at your visit today.  Please pick up your prep supplies at the pharmacy within the next 1-3 days. If you use inhalers (even only as needed), please bring them with you on the day of your procedure.   If you are age 50 or older, your body mass index should be between 23-30. Your Body mass index is 38.86 kg/m. If this is out of the aforementioned range listed, please consider follow up with your Primary Care Provider.  If you are age 32 or younger, your body mass index should be between 19-25. Your Body mass index is 38.86 kg/m. If this is out of the aformentioned range listed, please consider follow up with your Primary Care Provider.

## 2018-01-14 NOTE — Progress Notes (Signed)
[name]    [id number]    1966-04-10  Primary Care Physician:Ross, [place]al [place], MD  Referring Physician: Gus Height, MD No address on file  Chief complaint: Family history of colon cancer  HPI:  52 year old female with family history of colon cancer here to discuss colorectal cancer screening. Her mother was diagnosed with colon cancer at age 67. She has no specific GI symptoms.  Denies any nausea, vomiting, abdominal pain, melena or bright red blood per rectum  Her only complaint is that she has not had menstruation since age 65, states she had early menopause.   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  She also has type 2 diabetes   Outpatient Encounter Medications as of 01/14/2018  Medication Sig  . ACCU-CHEK AVIVA PLUS test strip USE AS DIRECTED TO TEST BLOOD SUGARS 4 TIMES A DAY  . acetaminophen (TYLENOL) 500 MG tablet Take 1,000 mg by mouth every 6 (six) hours as needed for headache.  . albuterol (PROVENTIL HFA;VENTOLIN HFA) 108 (90 Base) MCG/ACT inhaler Inhale 2 puffs into the lungs every 6 (six) hours as needed for wheezing or shortness of breath. For wheezing  . Ascorbic Acid (VITAMIN C) 100 MG tablet Take 250 mg by mouth daily.   . cholecalciferol (VITAMIN D) 1000 UNITS tablet Take 1,000 Units by mouth daily with breakfast.  . Ferrous Sulfate (IRON) 325 (65 Fe) MG TABS Take 1 tablet by mouth daily.  Marland Kitchen gabapentin (NEURONTIN) 100 MG capsule Take 1-2 capsules (100-200 mg total) by mouth 3 (three) times daily.  . metFORMIN (GLUCOPHAGE) 500 MG tablet Take 500 mg by mouth 2 (two) times daily with a meal.  . Multiple Vitamin (MULTIVITAMIN WITH MINERALS) TABS tablet Take 1 tablet by mouth daily.  Marland Kitchen oxyCODONE-acetaminophen (PERCOCET) 5-325 MG tablet Take 1 tablet by mouth every 8 (eight) hours as needed for moderate pain. (Patient taking differently: Take 1  tablet by mouth every 8 (eight) hours as needed for moderate pain. weaning off)  . VOLTAREN 1 % GEL Apply 1 application topically 4 (four) times daily as needed.  . [DISCONTINUED] metFORMIN (GLUCOPHAGE) 500 MG tablet Take half tablet twice a day (Patient taking differently: Take 500 mg by mouth 2 (two) times daily with a meal. Take half tablet twice a day)  . [DISCONTINUED] traMADol (ULTRAM) 50 MG tablet Take 1 tablet (50 mg total) by mouth 2 (two) times daily as needed.   No facility-administered encounter medications on file as of 01/14/2018.     Allergies as of 01/14/2018 - Review Complete 01/14/2018  Allergen Reaction Noted  . Voltaren [diclofenac sodium] Itching 08/10/2013    Past Medical History:  Diagnosis Date  . Asthma   . Chronic knee pain    right  . Depression   . Meniscus tear   . Perimenopausal   . Rheumatoid arthritis ([place])   . Rotator cuff tear   . Slipped intervertebral disc   . Stroke [facility])     Past Surgical History:  Procedure Laterality Date  . APPLICATION OF CRANIAL NAVIGATION Right 10/03/2015   Procedure: APPLICATION OF CRANIAL NAVIGATION;  Surgeon: Consuella Lose, MD;  Location: [place] [facility];  Service: Neurosurgery;  Laterality: Right;  . CESAREAN SECTION    . CRANIOTOMY Right 10/03/2015   Procedure: Stereotactic right frontoparietal craniotomy for excisional biopsy of lesion ;  Surgeon: Consuella Lose, MD;  Location: [place]  ORS;  Service: Neurosurgery;  Laterality: Right;  . ROTATOR CUFF REPAIR Right   . TOOTH EXTRACTION N/A 10/07/2015   Procedure: Extraction of tooth #18 with alveoloplasty;  Surgeon: Lenn Cal, DDS;  Location: [place];  Service: Oral Surgery;  Laterality: N/A;  . TUBAL LIGATION      Family History  Problem Relation Age of Onset  . Cancer Mother        colon cancer  . Cancer Father        brain cancer  . Alcohol abuse Father   . Diabetes Father     Social History   Socioeconomic History  . Marital status: Divorced     Spouse name: Not on file  . Number of children: Not on file  . Years of education: Not on file  . Highest education level: Not on file  Occupational History  . Not on file  Social Needs  . Financial resource strain: Not on file  . Food insecurity:    Worry: Not on file    Inability: Not on file  . Transportation needs:    Medical: Not on file    Non-medical: Not on file  Tobacco Use  . Smoking status: Never Smoker  . Smokeless tobacco: Never Used  Substance and Sexual Activity  . Alcohol use: No  . Drug use: No  . Sexual activity: Not Currently  Lifestyle  . Physical activity:    Days per week: Not on file    Minutes per session: Not on file  . Stress: Not on file  Relationships  . Social connections:    Talks on phone: Not on file    Gets together: Not on file    Attends religious service: Not on file    Active member of club or organization: Not on file    Attends meetings of clubs or organizations: Not on file    Relationship status: Not on file  . Intimate partner violence:    Fear of current or ex partner: Not on file    Emotionally abused: Not on file    Physically abused: Not on file    Forced sexual activity: Not on file  Other Topics Concern  . Not on file  Social History Narrative  . Not on file      Review of systems: Review of Systems  Constitutional: Negative for fever and chills.  HENT: Negative.   Eyes: Negative for blurred vision.  Respiratory: Negative for cough, shortness of breath and wheezing.   Cardiovascular: Negative for chest pain and palpitations.  Gastrointestinal: as per HPI Genitourinary: Negative for dysuria, urgency, frequency and hematuria.  Musculoskeletal: Positive for myalgias, back pain and joint pain.  Skin: Negative for itching and rash.  Neurological: Negative for dizziness, tremors, focal weakness, seizures and loss of consciousness.  Endo/Heme/Allergies: Positive for seasonal allergies.  Psychiatric/Behavioral:  Negative for depression, suicidal ideas and hallucinations.  All other systems reviewed and are negative.   Physical Exam: Vitals:   01/14/18 0911  BP: 136/86  Pulse: 72   Body mass index is 38.86 kg/m. Gen:      No acute distress HEENT:  EOMI, sclera anicteric Neck:     No masses; no thyromegaly Lungs:    Clear to auscultation bilaterally; normal respiratory effort CV:         Regular rate and rhythm; no murmurs Abd:      + bowel sounds; soft, non-tender; no palpable masses, no distension Ext:    No edema;  adequate peripheral perfusion Skin:      Warm and dry; no rash Neuro: alert and oriented x 3 Psych: normal mood and affect  Data Reviewed:  Reviewed labs, radiology imaging, old records and pertinent past GI work up   Assessment and Plan/Recommendations:  52 year old female with history of cerebral abscess, stroke, status post craniotomy 2017 with no residual weakness, family history of colon cancer in her mother at age 56 is here to discuss colorectal cancer screening The risks and benefits as well as alternatives of endoscopic procedure(s) have been discussed and reviewed. All questions answered. The patient agrees to proceed. Schedule for colonoscopy   Greater than 50% of the time used for counseling as well as treatment plan and follow-up. She had multiple questions which were answered to her satisfaction  K. Denzil Magnuson , MD [phone]    CC: Gus Height, MD

## 2018-02-04 ENCOUNTER — Encounter: Payer: Self-pay | Admitting: Gastroenterology

## 2018-02-04 ENCOUNTER — Other Ambulatory Visit: Payer: Self-pay

## 2018-02-04 ENCOUNTER — Ambulatory Visit (AMBULATORY_SURGERY_CENTER): Payer: Medicaid Other | Admitting: Gastroenterology

## 2018-02-04 VITALS — BP 140/98 | HR 62 | Temp 98.9°F | Resp 17 | Ht 69.0 in | Wt 263.0 lb

## 2018-02-04 DIAGNOSIS — Z800 Family history of malignant neoplasm of digestive organs: Secondary | ICD-10-CM | POA: Diagnosis not present

## 2018-02-04 DIAGNOSIS — K635 Polyp of colon: Secondary | ICD-10-CM

## 2018-02-04 DIAGNOSIS — K621 Rectal polyp: Secondary | ICD-10-CM | POA: Diagnosis not present

## 2018-02-04 DIAGNOSIS — D128 Benign neoplasm of rectum: Secondary | ICD-10-CM

## 2018-02-04 DIAGNOSIS — D125 Benign neoplasm of sigmoid colon: Secondary | ICD-10-CM

## 2018-02-04 MED ORDER — SODIUM CHLORIDE 0.9 % IV SOLN: 500.0000 mL | Freq: Once | INTRAVENOUS | Status: AC

## 2018-02-04 NOTE — Progress Notes (Signed)
To PACU, VSS. Report to [email or web address] 

## 2018-02-04 NOTE — Op Note (Signed)
[facility] Patient Name: [name] Procedure Date: 02/04/2018 10:14 AM MRN: [id number] Endoscopist: Mauri Pole , MD Age: 52 Referring MD:  Date of Birth: 02/28/1966 Gender: Female Account #: [id number] Procedure:                Colonoscopy Indications:              Screening in patient at increased risk: Family                            history of 1st-degree relative with colorectal                            cancer before age 17 years Medicines:                Monitored Anesthesia Care Procedure:                Pre-Anesthesia Assessment:                           - Prior to the procedure, a History and Physical                            was performed, and patient medications and                            allergies were reviewed. The patient's tolerance of                            previous anesthesia was also reviewed. The risks                            and benefits of the procedure and the sedation                            options and risks were discussed with the patient.                            All questions were answered, and informed consent                            was obtained. Prior Anticoagulants: The patient has                            taken no previous anticoagulant or antiplatelet                            agents. ASA Grade Assessment: III - A patient with                            severe systemic disease. After reviewing the risks                            and benefits, the patient was deemed in  satisfactory condition to undergo the procedure.                           After obtaining informed consent, the colonoscope                            was passed under direct vision. Throughout the                            procedure, the patient's blood pressure, pulse, and                            oxygen saturations were monitored continuously. The                            Colonoscope was introduced  through the anus and                            advanced to the the cecum, identified by                            appendiceal orifice and ileocecal valve. The                            colonoscopy was performed without difficulty. The                            patient tolerated the procedure well. The quality                            of the bowel preparation was good. The ileocecal                            valve, appendiceal orifice, and rectum were                            photographed. Scope In: 10:23:08 AM Scope Out: 10:40:06 AM Scope Withdrawal Time: 0 hours 11 minutes 54 seconds  Total Procedure Duration: 0 hours 16 minutes 58 seconds  Findings:                 The perianal and digital rectal examinations were                            normal.                           Scattered small and large-mouthed diverticula were                            found in the sigmoid colon, descending colon,                            transverse colon and ascending colon.  Non-bleeding internal hemorrhoids were found during                            retroflexion. The hemorrhoids were small.                           Three sessile polyps were found in the rectum and                            sigmoid colon. The polyps were 6 to 8 mm in size.                            These polyps were removed with a cold snare.                            Resection and retrieval were complete. Complications:            No immediate complications. Estimated Blood Loss:     Estimated blood loss was minimal. Impression:               - Moderate diverticulosis in the sigmoid colon, in                            the descending colon, in the transverse colon and                            in the ascending colon.                           - Non-bleeding internal hemorrhoids.                           - Three 6 to 8 mm polyps in the rectum and in the                            sigmoid  colon, removed with a cold snare. Resected                            and retrieved. Recommendation:           - Patient has a contact number available for                            emergencies. The signs and symptoms of potential                            delayed complications were discussed with the                            patient. Return to normal activities tomorrow.                            Written discharge instructions were provided to the  patient.                           - Resume previous diet.                           - Continue present medications.                           - Await pathology results.                           - Repeat colonoscopy in 3 - 5 years for                            surveillance based on pathology results. Mauri Pole, MD 02/04/2018 10:45:25 AM This report has been signed electronically.

## 2018-02-04 NOTE — Patient Instructions (Signed)
YOU HAD AN ENDOSCOPIC PROCEDURE TODAY AT THE [facility]:   Refer to the procedure report that was given to you for any specific questions about what was found during the examination.  If the procedure report does not answer your questions, please call your gastroenterologist to clarify.  If you requested that your care partner not be given the details of your procedure findings, then the procedure report has been included in a sealed envelope for you to review at your convenience later.  YOU SHOULD EXPECT: Some feelings of bloating in the abdomen. Passage of more gas than usual.  Walking can help get rid of the air that was put into your GI tract during the procedure and reduce the bloating. If you had a lower endoscopy (such as a colonoscopy or flexible sigmoidoscopy) you may notice spotting of blood in your stool or on the toilet paper. If you underwent a bowel prep for your procedure, you may not have a normal bowel movement for a few days.  Please Note:  You might notice some irritation and congestion in your nose or some drainage.  This is from the oxygen used during your procedure.  There is no need for concern and it should clear up in a day or so.  SYMPTOMS TO REPORT IMMEDIATELY:   Following lower endoscopy (colonoscopy or flexible sigmoidoscopy):  Excessive amounts of blood in the stool  Significant tenderness or worsening of abdominal pains  Swelling of the abdomen that is new, acute  Fever of 100F or higher  For urgent or emergent issues, a gastroenterologist can be reached at any hour by calling [phone].   DIET:  We do recommend a small meal at first, but then you may proceed to your regular diet.  Drink plenty of fluids but you should avoid alcoholic beverages for 24 hours.  ACTIVITY:  You should plan to take it easy for the rest of today and you should NOT DRIVE or use heavy machinery until tomorrow (because of the sedation medicines used during the test).     FOLLOW UP: Our staff will call the number listed on your records the next business day following your procedure to check on you and address any questions or concerns that you may have regarding the information given to you following your procedure. If we do not reach you, we will leave a message.  However, if you are feeling well and you are not experiencing any problems, there is no need to return our call.  We will assume that you have returned to your regular daily activities without incident.  If any biopsies were taken you will be contacted by phone or by letter within the next 1-3 weeks.  Please call us at [phone] if you have not heard about the biopsies in 3 weeks.    SIGNATURES/CONFIDENTIALITY: You and/or your care partner have signed paperwork which will be entered into your electronic medical record.  These signatures attest to the fact that that the information above on your After Visit Summary has been reviewed and is understood.  Full responsibility of the confidentiality of this discharge information lies with you and/or your care-partner. 

## 2018-02-04 NOTE — Progress Notes (Signed)
Called to room to assist during endoscopic procedure.  Patient ID and intended procedure confirmed with present staff. Received instructions for my participation in the procedure from the performing physician.  

## 2018-02-05 ENCOUNTER — Telehealth: Payer: Self-pay | Admitting: *Deleted

## 2018-02-05 ENCOUNTER — Encounter: Payer: Self-pay | Admitting: Gastroenterology

## 2018-02-05 NOTE — Telephone Encounter (Signed)
  Follow up Call-  Call back number 02/04/2018  Post procedure Call Back phone  # [phone]  Permission to leave phone message Yes  Some recent data might be hidden     Patient questions:  Do you have a fever, pain , or abdominal swelling? No. Pain Score  0 *  Have you tolerated food without any problems? Yes.    Have you been able to return to your normal activities? Yes.    Do you have any questions about your discharge instructions: Diet   No. Medications  No. Follow up visit  No.  Do you have questions or concerns about your Care? No.  Actions: * If pain score is 4 or above: No action needed, pain <4.

## 2018-02-06 ENCOUNTER — Telehealth: Payer: Self-pay | Admitting: *Deleted

## 2018-02-06 MED ORDER — OXYCODONE-ACETAMINOPHEN 5-325 MG PO TABS: 1.0000 | ORAL_TABLET | Freq: Two times a day (BID) | ORAL | 0 refills | Status: DC | PRN

## 2018-02-06 NOTE — Telephone Encounter (Signed)
Return [name] call , we will continue with Oxycodone wean . Oxycodone e-scribe.   Call office in the morning for a June appointment.

## 2018-02-06 NOTE — Telephone Encounter (Signed)
Spoke with patient.  She is asking for her next script for oxycodone. She acknowledged wean down.  Asking for [name] to call and discuss wean down options.

## 2018-02-11 ENCOUNTER — Encounter: Payer: Self-pay | Admitting: Gastroenterology

## 2018-03-03 ENCOUNTER — Telehealth: Payer: Self-pay | Admitting: Registered Nurse

## 2018-03-03 ENCOUNTER — Telehealth: Payer: Self-pay | Admitting: *Deleted

## 2018-03-03 MED ORDER — OXYCODONE-ACETAMINOPHEN 5-325 MG PO TABS: 1.0000 | ORAL_TABLET | Freq: Every day | ORAL | 0 refills | Status: AC | PRN

## 2018-03-03 MED ORDER — TRAMADOL HCL 50 MG PO TABS: 50.0000 mg | ORAL_TABLET | Freq: Three times a day (TID) | ORAL | 1 refills | Status: DC | PRN

## 2018-03-03 NOTE — Telephone Encounter (Signed)
[name] has her final appointment with our office on 03/04/2018. This will be her last prescription of Oxycodone,her tablets will be decreased to 30 tablets. One tablet daily as needed for pain. We will prescribe Tramadol. [name] was instructed to speak with her PCP regarding prescribing the Tramadol. Her PCP will not prescribe her Oxycodone she reports. Her last appointment will be on 03/04/2018. [name] reports she has been dealing with withdrawal symptoms with the weaning of Oxycodone. She will be given a list of Pain Management offices, the following is based on  Dr. Naaman Plummer recommendations, she verbalizes understanding. PMP Aware Web-site was reviewed, last Oxycodone was picked up on 02/06/2018.

## 2018-03-03 NOTE — Telephone Encounter (Signed)
Prior Authorization submitted for tramadol and oxycodone-acetaminophen to [facility]

## 2018-03-04 ENCOUNTER — Encounter: Payer: Medicaid Other | Admitting: Registered Nurse

## 2018-03-04 NOTE — Telephone Encounter (Signed)
Tramadol approved, pharmacy contacted, patient notified

## 2018-03-04 NOTE — Telephone Encounter (Signed)
Oxycodone acetaminophen 5-325mg  approved, tramadol 50 mg denied, Tramadol 50 mg resubmitted with correction

## 2018-03-05 ENCOUNTER — Encounter: Payer: Medicaid Other | Attending: Physical Medicine & Rehabilitation | Admitting: Registered Nurse

## 2018-03-05 DIAGNOSIS — Z800 Family history of malignant neoplasm of digestive organs: Secondary | ICD-10-CM | POA: Insufficient documentation

## 2018-03-05 DIAGNOSIS — Z833 Family history of diabetes mellitus: Secondary | ICD-10-CM | POA: Insufficient documentation

## 2018-03-05 DIAGNOSIS — R531 Weakness: Secondary | ICD-10-CM | POA: Insufficient documentation

## 2018-03-05 DIAGNOSIS — G060 Intracranial abscess and granuloma: Secondary | ICD-10-CM | POA: Insufficient documentation

## 2018-03-05 DIAGNOSIS — M069 Rheumatoid arthritis, unspecified: Secondary | ICD-10-CM | POA: Insufficient documentation

## 2018-03-05 DIAGNOSIS — J45909 Unspecified asthma, uncomplicated: Secondary | ICD-10-CM | POA: Insufficient documentation

## 2018-03-05 DIAGNOSIS — R51 Headache: Secondary | ICD-10-CM | POA: Insufficient documentation

## 2018-03-05 DIAGNOSIS — R42 Dizziness and giddiness: Secondary | ICD-10-CM | POA: Insufficient documentation

## 2018-03-05 DIAGNOSIS — Z808 Family history of malignant neoplasm of other organs or systems: Secondary | ICD-10-CM | POA: Insufficient documentation

## 2018-03-05 DIAGNOSIS — Z811 Family history of alcohol abuse and dependence: Secondary | ICD-10-CM | POA: Insufficient documentation

## 2018-03-05 DIAGNOSIS — S83206A Unspecified tear of unspecified meniscus, current injury, right knee, initial encounter: Secondary | ICD-10-CM | POA: Insufficient documentation

## 2018-03-05 DIAGNOSIS — G8929 Other chronic pain: Secondary | ICD-10-CM | POA: Insufficient documentation

## 2018-03-05 DIAGNOSIS — R410 Disorientation, unspecified: Secondary | ICD-10-CM | POA: Insufficient documentation

## 2018-05-15 ENCOUNTER — Other Ambulatory Visit: Payer: Self-pay | Admitting: Registered Nurse

## 2018-05-17 ENCOUNTER — Other Ambulatory Visit: Payer: Self-pay | Admitting: Registered Nurse

## 2018-07-08 ENCOUNTER — Ambulatory Visit (HOSPITAL_COMMUNITY)
Admission: EM | Admit: 2018-07-08 | Discharge: 2018-07-08 | Disposition: A | Discharge status: Home / Self Care | Payer: Medicaid Other | Attending: Family Medicine | Admitting: Family Medicine

## 2018-07-08 ENCOUNTER — Encounter (HOSPITAL_COMMUNITY): Payer: Self-pay | Admitting: Emergency Medicine

## 2018-07-08 DIAGNOSIS — M62830 Muscle spasm of back: Secondary | ICD-10-CM

## 2018-07-08 DIAGNOSIS — M62838 Other muscle spasm: Secondary | ICD-10-CM

## 2018-07-08 DIAGNOSIS — M436 Torticollis: Secondary | ICD-10-CM | POA: Diagnosis not present

## 2018-07-08 MED ORDER — NAPROXEN 375 MG PO TABS: 375.0000 mg | ORAL_TABLET | Freq: Two times a day (BID) | ORAL | 0 refills | Status: AC

## 2018-07-08 MED ORDER — KETOROLAC TROMETHAMINE 60 MG/2ML IM SOLN
60.0000 mg | Freq: Once | INTRAMUSCULAR | Status: AC
  Administered 2018-07-08: 60 mg via INTRAMUSCULAR

## 2018-07-08 MED ORDER — CYCLOBENZAPRINE HCL 5 MG PO TABS: 5.0000 mg | ORAL_TABLET | Freq: Every day | ORAL | 0 refills | Status: AC

## 2018-07-08 MED ORDER — KETOROLAC TROMETHAMINE 60 MG/2ML IM SOLN
INTRAMUSCULAR | Status: AC
  Filled 2018-07-08: qty 2

## 2018-07-08 NOTE — ED Provider Notes (Addendum)
[place]   [id number] 07/08/18 Arrival Time: 4098  CC: neck PAIN  SUBJECTIVE: History from: patient. [name] is a 52 y.o. female hs significant for DM, hx of TIA, asthma, complains of worsening neck pain that began 3 days ago.  Symptoms began after swimming laps at the pool. Pain is diffuse about the neck.  Has tried OTC medications without relief.  Symptoms are made worse with neck ROM.  Denies similar symptoms in the past.  Denies fever, chills, erythema, ecchymosis, effusion, weakness, numbness and tingling, saddle paresthesias, or bowel/bladder incontinence.      Patient has two twin boys in seventh grade, one with autism.  Works for [employer].    ROS: As per HPI.  Past Medical History:  Diagnosis Date  . Allergy   . Asthma   . Chronic knee pain    right  . Depression   . Diabetes mellitus without complication ([place])   . Meniscus tear   . Perimenopausal   . Rheumatoid arthritis ([place])   . Rotator cuff tear   . Slipped intervertebral disc   . Stroke [facility])    Past Surgical History:  Procedure Laterality Date  . APPLICATION OF CRANIAL NAVIGATION Right 10/03/2015   Procedure: APPLICATION OF CRANIAL NAVIGATION;  Surgeon: Consuella Lose, MD;  Location: [place] NEURO ORS;  Service: Neurosurgery;  Laterality: Right;  . CESAREAN SECTION    . CRANIOTOMY Right 10/03/2015   Procedure: Stereotactic right frontoparietal craniotomy for excisional biopsy of lesion ;  Surgeon: Consuella Lose, MD;  Location: [place] NEUR[facility] ORS;  Service: Neurosurgery;  Laterality: Right;  . MENISCUS REPAIR Right 2012  . ROTATOR CUFF REPAIR Right   . TOOTH EXTRACTION N/A 10/07/2015   Procedure: Extraction of tooth #18 with alveoloplasty;  Surgeon: Lenn Cal, DDS;  Location: [place];  Service: Oral Surgery;  Laterality: N/A;  . TUBAL LIGATION     Allergies  Allergen Reactions  . Voltaren [Diclofenac Sodium] Itching   Current Facility-Administered Medications on File Prior to  Encounter  Medication Dose Route Frequency Provider Last Rate Last Dose  . 0.9 %  sodium chloride infusion  500 mL Intravenous Once Nandigam, Venia Minks, MD       Current Outpatient Medications on File Prior to Encounter  Medication Sig Dispense Refill  . albuterol (PROVENTIL HFA;VENTOLIN HFA) 108 (90 Base) MCG/ACT inhaler Inhale 2 puffs into the lungs every 6 (six) hours as needed for wheezing or shortness of breath. For wheezing 1 Inhaler 1  . Ascorbic Acid (VITAMIN C) 100 MG tablet Take 250 mg by mouth daily.     . cholecalciferol (VITAMIN D) 1000 UNITS tablet Take 1,000 Units by mouth daily with breakfast.    . Ferrous Sulfate (IRON) 325 (65 Fe) MG TABS Take 1 tablet by mouth daily.    Marland Kitchen gabapentin (NEURONTIN) 100 MG capsule Take 1-2 capsules (100-200 mg total) by mouth 3 (three) times daily. 150 capsule 3  . metFORMIN (GLUCOPHAGE) 500 MG tablet Take 500 mg by mouth 2 (two) times daily with a meal.    . Multiple Vitamin (MULTIVITAMIN WITH MINERALS) TABS tablet Take 1 tablet by mouth daily.    . traMADol (ULTRAM) 50 MG tablet Take 1 tablet (50 mg total) by mouth every 8 (eight) hours as needed. 90 tablet 1  . ACCU-CHEK AVIVA PLUS test strip USE AS DIRECTED TO TEST BLOOD SUGARS 4 TIMES A DAY  5  . acetaminophen (TYLENOL) 500 MG tablet Take 1,000 mg by mouth every 6 (six)  hours as needed for headache.    . oxyCODONE-acetaminophen (PERCOCET) 5-325 MG tablet Take 1 tablet by mouth daily as needed for moderate pain. 30 tablet 0   Social History   Socioeconomic History  . Marital status: Divorced    Spouse name: Not on file  . Number of children: 2  . Years of education: Not on file  . Highest education level: Not on file  Occupational History  . Occupation: Probation officer  Social Needs  . Financial resource strain: Not on file  . Food insecurity:    Worry: Not on file    Inability: Not on file  . Transportation needs:    Medical: Not on file    Non-medical: Not on file  Tobacco Use  .  Smoking status: Never Smoker  . Smokeless tobacco: Never Used  Substance and Sexual Activity  . Alcohol use: Yes    Comment: occasionally  . Drug use: No  . Sexual activity: Not Currently  Lifestyle  . Physical activity:    Days per week: Not on file    Minutes per session: Not on file  . Stress: Not on file  Relationships  . Social connections:    Talks on phone: Not on file    Gets together: Not on file    Attends religious service: Not on file    Active member of club or organization: Not on file    Attends meetings of clubs or organizations: Not on file    Relationship status: Not on file  . Intimate partner violence:    Fear of current or ex partner: Not on file    Emotionally abused: Not on file    Physically abused: Not on file    Forced sexual activity: Not on file  Other Topics Concern  . Not on file  Social History Narrative  . Not on file   Family History  Problem Relation Age of Onset  . Colon cancer Mother   . Alcohol abuse Father   . Diabetes Father   . Brain cancer Father   . Leukemia Sister   . Heart attack Paternal Grandmother   . Hypertension Sister   . Hypertension Sister     OBJECTIVE:  Vitals:   07/08/18 1119  BP: 137/60  Pulse: 69  Resp: 16  Temp: 97.6 F (36.4 C)  TempSrc: Oral  SpO2: 100%    General appearance: AOx3; in no acute distress.  Head: NCAT Lungs: CTA bilaterally Heart: RRR.  Clear S1 and S2 without murmur, gallops, or rubs.  Radial pulses 2+ bilaterally. Musculoskeletal: Neck Inspection: Skin warm, dry, clear and intact without obvious erythema, effusion, or ecchymosis.  Palpation: diffusely tender about the cervical musculature and trapezius ROM: LROM Strength: 5/5 shld abduction, 5/5 shld adduction, 5/5 elbow flexion, 5/5 elbow extension, 5/5 grip strength, 5/5 hip flexion, 5/5 knee abduction, 5/5 knee adduction, 5/5 knee flexion, 5/5 knee extension, 5/5 dorsiflexion, 5/5 plantar flexion Skin: warm and  dry Neurologic: Ambulates without difficulty; Sensation intact about the upper/ lower extremities Psychological: alert and cooperative; normal mood and affect  ASSESSMENT & PLAN:  1. Torticollis   2. Trapezius muscle spasm    Meds ordered this encounter  Medications  . cyclobenzaprine (FLEXERIL) 5 MG tablet    Sig: Take 1 tablet (5 mg total) by mouth at bedtime.    Dispense:  15 tablet    Refill:  0    Order Specific Question:   Supervising Provider    Answer:   Valere Dross,  Jadene Pierini [phone]  . naproxen (NAPROSYN) 375 MG tablet    Sig: Take 1 tablet (375 mg total) by mouth 2 (two) times daily.    Dispense:  20 tablet    Refill:  0    Order Specific Question:   Supervising Provider    Answer:   Wynona Luna [phone]  . ketorolac (TORADOL) injection 60 mg   Toradol shot given in office Continue conservative management of rest, ice, heat, and gentle stretches Take naproxen as needed for pain relief (may cause abdominal discomfort, ulcers, and GI bleeds avoid taking with other NSAIDs) Take cyclobenzaprine at nighttime for symptomatic relief. Avoid driving or operating heavy machinery while using medication. Follow up with PCP if symptoms persist Return or go to the ER if you have any new or worsening symptoms (fever, chills, chest pain, abdominal pain, changes in bowel or bladder habits, pain radiating into lower legs, etc...)   Reviewed expectations re: course of current medical issues. Questions answered. Outlined signs and symptoms indicating need for more acute intervention. Patient verbalized understanding. After Visit Summary given.    Lestine Box, PA-C 07/08/18 [place], [place], [place] 07/08/18 1213

## 2018-07-08 NOTE — Discharge Instructions (Signed)
Toradol shot given in office Continue conservative management of rest, ice, heat, and gentle stretches Take naproxen as needed for pain relief (may cause abdominal discomfort, ulcers, and GI bleeds avoid taking with other NSAIDs) Take cyclobenzaprine at nighttime for symptomatic relief. Avoid driving or operating heavy machinery while using medication. Follow up with PCP if symptoms persist Return or go to the ER if you have any new or worsening symptoms (fever, chills, chest pain, abdominal pain, changes in bowel or bladder habits, pain radiating into lower legs, etc...)

## 2018-07-08 NOTE — ED Triage Notes (Signed)
PT pulled a muscle in her back while swimming Saturday night. It has gotten progressively worse.   PT has a history of migraines, those have been worse as well.

## 2018-08-12 ENCOUNTER — Emergency Department (HOSPITAL_COMMUNITY)
Admission: EM | Admit: 2018-08-12 | Discharge: 2018-08-12 | Disposition: A | Discharge status: Home / Self Care | Payer: Medicaid Other | Attending: Emergency Medicine | Admitting: Emergency Medicine

## 2018-08-12 ENCOUNTER — Other Ambulatory Visit: Payer: Self-pay

## 2018-08-12 ENCOUNTER — Encounter (HOSPITAL_COMMUNITY): Payer: Self-pay

## 2018-08-12 DIAGNOSIS — M545 Low back pain, unspecified: Secondary | ICD-10-CM

## 2018-08-12 DIAGNOSIS — M069 Rheumatoid arthritis, unspecified: Secondary | ICD-10-CM | POA: Insufficient documentation

## 2018-08-12 DIAGNOSIS — Z7984 Long term (current) use of oral hypoglycemic drugs: Secondary | ICD-10-CM | POA: Insufficient documentation

## 2018-08-12 DIAGNOSIS — E119 Type 2 diabetes mellitus without complications: Secondary | ICD-10-CM | POA: Diagnosis not present

## 2018-08-12 MED ORDER — TRAMADOL HCL 50 MG PO TABS: 50.0000 mg | ORAL_TABLET | Freq: Four times a day (QID) | ORAL | 0 refills | Status: AC | PRN

## 2018-08-12 NOTE — ED Triage Notes (Signed)
Pt states lower left side back pain. Pt states that she has a large autistic son that she helps to move and thinks she pulled a muscle. No relief with naproxen.

## 2018-08-12 NOTE — Discharge Instructions (Addendum)
Begin taking naproxen as prescribed by your doctor.  Begin taking tramadol as prescribed as needed for pain not relieved with naproxen.  Follow-up with your primary doctor if symptoms are not improving in the next week, and return to the ER if you develop weakness in your legs, bowel or bladder incontinence, or other new and concerning symptoms.

## 2018-08-12 NOTE — ED Provider Notes (Signed)
[facility] DEPT Provider Note   CSN: [id number] Arrival date & time: 08/12/18  0848     History   Chief Complaint Chief Complaint  Patient presents with  . Back Pain    HPI [name] is a 52 y.o. female.  Patient is a 52 year old female with past medical history of diabetes, asthma, and prior dental abscess requiring craniotomy.  She presents today for evaluation of back pain.  Is been ongoing for the past month.  She states that she has known disc disease and this is been problematic for many years.  It has worsened over the past month.  She denies any specific injury, but does state that she has been lifting and occasionally having to restrain her large, autistic son.  She also reports that she has been swimming frequently for exercise and wonders if may be this is the cause.  She denies any radiation into her legs.  She denies any weakness, numbness or tingling.  She denies any bowel or bladder complaints.  The history is provided by the patient.  Back Pain   This is a new problem. Episode onset: 1 month ago. The problem occurs constantly. The problem has been gradually worsening. The pain is associated with no known injury. The pain is present in the lumbar spine. The quality of the pain is described as stabbing. The pain does not radiate. The pain is moderate. The symptoms are aggravated by bending, twisting and certain positions. The pain is the same all the time. Pertinent negatives include no numbness, no bowel incontinence, no perianal numbness and no bladder incontinence. She has tried NSAIDs for the symptoms. The treatment provided no relief.    Past Medical History:  Diagnosis Date  . Allergy   . Asthma   . Chronic knee pain    right  . Depression   . Diabetes mellitus without complication ([place])   . Meniscus tear   . Perimenopausal   . Rheumatoid arthritis ([place])   . Rotator cuff tear   . Slipped intervertebral disc   . Stroke [facility])      Patient Active Problem List   Diagnosis Date Noted  . Severe frontal headaches 12/06/2015  . Left hemiparesis ([place]) 10/07/2015  . Abscessed tooth 10/07/2015  . Asthma   . Acute blood loss anemia   . 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 Brain lesion 09/29/2015  . TIA (transient ischemic attack) 09/28/2015  . Headache 09/28/2015  . Adjustment disorder with mixed anxiety and depressed mood 07/02/2013  . Depressive disorder 07/03/2012    Past Surgical History:  Procedure Laterality Date  . APPLICATION OF CRANIAL NAVIGATION Right 10/03/2015   Procedure: APPLICATION OF CRANIAL NAVIGATION;  Surgeon: Consuella Lose, MD;  Location: [place] NEURO ORS;  Service: Neurosurgery;  Laterality: Right;  . CESAREAN SECTION    . CRANIOTOMY Right 10/03/2015   Procedure: Stereotactic right frontoparietal craniotomy for excisional biopsy of lesion ;  Surgeon: Consuella Lose, MD;  Location: [place] NEURO ORS;  Service: Neurosurgery;  Laterality: Right;  . MENISCUS REPAIR Right 2012  . ROTATOR CUFF REPAIR Right   . TOOTH EXTRACTION N/A 10/07/2015   Procedure: Extraction of tooth #18 with alveoloplasty;  Surgeon: Lenn Cal, DDS;  Location: [place];  Service: Oral Surgery;  Laterality: N/A;  . TUBAL LIGATION       OB History   None      Home Medications    Prior to Admission medications   Medication Sig Start Date End Date  Taking? Authorizing Provider  albuterol (PROVENTIL HFA;VENTOLIN HFA) 108 (90 Base) MCG/ACT inhaler Inhale 2 puffs into the lungs every 6 (six) hours as needed for wheezing or shortness of breath. For wheezing 10/14/15  Yes Angiulli, Lavon Paganini, PA-C  Ascorbic Acid (VITAMIN C) 100 MG tablet Take 250 mg by mouth daily.    Yes [provider]  cholecalciferol (VITAMIN D) 1000 UNITS tablet Take 1,000 Units by mouth daily with breakfast.   Yes [provider]  cyclobenzaprine (FLEXERIL) 5 MG tablet Take 1 tablet (5 mg total) by mouth at bedtime. 07/08/18  Yes Wurst, [place],  PA-C  ECHINACEA PO Take 1 tablet by mouth daily.   Yes [provider]  gabapentin (NEURONTIN) 100 MG capsule Take 1-2 capsules (100-200 mg total) by mouth 3 (three) times daily. Patient taking differently: Take 100-200 mg by mouth 2 (two) times daily.  11/08/17  Yes Meredith Staggers, MD  ibuprofen (ADVIL,MOTRIN) 200 MG tablet Take 400 mg by mouth every 6 (six) hours as needed for headache or mild pain.   Yes [provider]  metFORMIN (GLUCOPHAGE) 500 MG tablet Take 500 mg by mouth 2 (two) times daily with a meal.   Yes [provider]  mometasone (NASONEX) 50 MCG/ACT nasal spray Place 1 spray into the nose as needed (congestion).   Yes [provider]  Multiple Vitamin (MULTIVITAMIN WITH MINERALS) TABS tablet Take 1 tablet by mouth daily.   Yes [provider]  naproxen (NAPROSYN) 375 MG tablet Take 1 tablet (375 mg total) by mouth 2 (two) times daily. 07/08/18  Yes Wurst, [place], PA-C  ACCU-CHEK AVIVA PLUS test strip USE AS DIRECTED TO TEST BLOOD SUGARS 4 TIMES A DAY 07/09/16   [provider]  oxyCODONE-acetaminophen (PERCOCET) 5-325 MG tablet Take 1 tablet by mouth daily as needed for moderate pain. Patient not taking: Reported on 08/12/2018 03/03/18   Bayard Hugger, NP  traMADol (ULTRAM) 50 MG tablet Take 1 tablet (50 mg total) by mouth every 8 (eight) hours as needed. Patient not taking: Reported on 08/12/2018 03/03/18   Bayard Hugger, NP    Family History Family History  Problem Relation Age of Onset  . Colon cancer Mother   . Alcohol abuse Father   . Diabetes Father   . Brain cancer Father   . Leukemia Sister   . Heart attack Paternal Grandmother   . Hypertension Sister   . Hypertension Sister     Social History Social History   Tobacco Use  . Smoking status: Never Smoker  . Smokeless tobacco: Never Used  Substance Use Topics  . Alcohol use: Yes    Comment: occasionally  . Drug use: No     Allergies     Voltaren [diclofenac sodium]   Review of Systems Review of Systems  Gastrointestinal: Negative for bowel incontinence.  Genitourinary: Negative for bladder incontinence.  Musculoskeletal: Positive for back pain.  Neurological: Negative for numbness.  All other systems reviewed and are negative.    Physical Exam Updated Vital Signs BP (!) 139/112 (BP Location: Right Arm)   Pulse 79   Temp 98 F (36.7 C) (Oral)   Resp 16   Ht 5\' 10"  (1.778 m)   Wt 117.9 kg   LMP 09/27/2011   SpO2 99%   BMI 37.31 kg/m   Physical Exam  Constitutional: She is oriented to person, place, and time. She appears well-developed and well-nourished. No distress.  HENT:  Head: Normocephalic and atraumatic.  Neck: Normal  range of motion. Neck supple.  Pulmonary/Chest: Effort normal.  Musculoskeletal:  There is tenderness to palpation in the soft tissues of the lumbar region.  Neurological: She is alert and oriented to person, place, and time.  DTRs are trace and symmetrical in the patellar and Achilles tendons bilaterally.  Strength is 5 out of 5 in both lower extremities and she is able to ambulate without difficulty.  Skin: Skin is warm and dry. She is not diaphoretic.  Nursing note and vitals reviewed.    ED Treatments / Results  Labs (all labs ordered are listed, but only abnormal results are displayed) Labs Reviewed - No data to display  EKG None  Radiology No results found.  Procedures Procedures (including critical care time)  Medications Ordered in ED Medications - No data to display   Initial Impression / Assessment and Plan / ED Course  I have reviewed the triage vital signs and the nursing notes.  Pertinent labs & imaging results that were available during my care of the patient were reviewed by me and considered in my medical decision making (see chart for details).  Patient with low back pain without evidence for neurologic deficit.  I see nothing that indicates an  emergent condition that would require emergent imaging.  She was prescribed naproxen yesterday by her primary doctor, however has not filled this prescription.  I will advise her to fill this medication.  She reports she has been on tramadol in the past which is helped.  She will be prescribed a small quantity of tramadol which she can take in addition to the naproxen and follow-up with primary doctor if not improving.  Final Clinical Impressions(s) / ED Diagnoses   Final diagnoses:  None    ED Discharge Orders    None       Veryl Speak, MD 08/12/18 [phone]

## 2018-10-10 ENCOUNTER — Other Ambulatory Visit: Payer: Self-pay | Admitting: Orthopedic Surgery

## 2018-10-10 DIAGNOSIS — M545 Low back pain, unspecified: Secondary | ICD-10-CM

## 2018-10-23 ENCOUNTER — Other Ambulatory Visit: Payer: Self-pay

## 2018-10-26 ENCOUNTER — Ambulatory Visit
Admission: RE | Admit: 2018-10-26 | Discharge: 2018-10-26 | Disposition: A | Discharge status: Home / Self Care | Payer: Medicaid Other | Source: Ambulatory Visit | Attending: Orthopedic Surgery | Admitting: Orthopedic Surgery

## 2018-10-26 DIAGNOSIS — M545 Low back pain, unspecified: Secondary | ICD-10-CM

## 2020-02-25 ENCOUNTER — Encounter: Payer: Self-pay | Admitting: Radiology

## 2020-02-25 ENCOUNTER — Telehealth: Payer: Self-pay | Admitting: Radiology

## 2020-02-25 NOTE — Telephone Encounter (Signed)
Left message for patient to call [id number] to schedule New Headache appointment, referred by [facility].

## 2020-02-26 ENCOUNTER — Other Ambulatory Visit: Payer: Self-pay | Admitting: Family Medicine

## 2020-02-26 DIAGNOSIS — R229 Localized swelling, mass and lump, unspecified: Secondary | ICD-10-CM

## 2020-05-26 ENCOUNTER — Ambulatory Visit
Admission: RE | Admit: 2020-05-26 | Discharge: 2020-05-26 | Disposition: A | Discharge status: Home / Self Care | Payer: Medicaid Other | Source: Ambulatory Visit | Attending: Family Medicine | Admitting: Family Medicine

## 2020-05-26 DIAGNOSIS — R229 Localized swelling, mass and lump, unspecified: Secondary | ICD-10-CM

## 2020-08-04 ENCOUNTER — Ambulatory Visit (HOSPITAL_COMMUNITY)
Admission: EM | Admit: 2020-08-04 | Discharge: 2020-08-04 | Disposition: A | Discharge status: Home / Self Care | Payer: Medicaid Other | Attending: Emergency Medicine | Admitting: Emergency Medicine

## 2020-08-04 ENCOUNTER — Encounter (HOSPITAL_COMMUNITY): Payer: Self-pay

## 2020-08-04 ENCOUNTER — Other Ambulatory Visit: Payer: Self-pay

## 2020-08-04 DIAGNOSIS — M546 Pain in thoracic spine: Secondary | ICD-10-CM | POA: Diagnosis not present

## 2020-08-04 MED ORDER — KETOROLAC TROMETHAMINE 30 MG/ML IJ SOLN
INTRAMUSCULAR | Status: AC
  Filled 2020-08-04: qty 1

## 2020-08-04 MED ORDER — PREDNISONE 10 MG PO TABS: ORAL_TABLET | ORAL | 0 refills | Status: AC

## 2020-08-04 MED ORDER — KETOROLAC TROMETHAMINE 30 MG/ML IJ SOLN
30.0000 mg | Freq: Once | INTRAMUSCULAR | Status: AC
  Administered 2020-08-04: 30 mg via INTRAMUSCULAR

## 2020-08-04 MED ORDER — TIZANIDINE HCL 4 MG PO TABS: 4.0000 mg | ORAL_TABLET | Freq: Four times a day (QID) | ORAL | 0 refills | Status: AC | PRN

## 2020-08-04 NOTE — ED Provider Notes (Signed)
[place]    CSN: [id number] Arrival date & time: 08/04/20  1525      History   Chief Complaint Chief Complaint  Patient presents with   Neck Pain   Shoulder Pain    HPI [name] is a 54 y.o. female history of DM type II, RA, prior TIA presenting today for evaluation of right-sided shoulder/back pain.  Reports over the past 6 months she has had chronic pain on her right thoracic area.  Was working on a patio and felt a pulling sensation in her back.  Pain is waxed and waned since, recently has been flared up.  Denies any new injury or trauma.  Pain is located mainly around her right shoulder blade area.  Has plans to follow-up with [facility] on 12/2.  She has been using NSAIDs without for relief.  Seen at another urgent care last week and was given Decadron which did provide some relief, patient was able to do many tasks around the house, but pain is worsened since this medicine is wearing off.  She got her Covid vaccine booster today.  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   Brain lesion 09/29/2015   TIA (transient ischemic attack) 09/28/2015   Headache 09/28/2015   Adjustment disorder with mixed anxiety and depressed mood 07/02/2013   Depressive disorder 07/03/2012    Past Surgical History:  Procedure Laterality Date   APPLICATION OF CRANIAL NAVIGATION Right 10/03/2015   Procedure: APPLICATION OF CRANIAL NAVIGATION;  Surgeon: Consuella Lose, MD;   Location: [place] [facility];  Service: Neurosurgery;  Laterality: Right;   CESAREAN SECTION     CRANIOTOMY Right 10/03/2015   Procedure: Stereotactic right frontoparietal craniotomy for excisional biopsy of lesion ;  Surgeon: Consuella Lose, MD;  Location: [place] [facility];  Service: Neurosurgery;  Laterality: Right;   MENISCUS REPAIR Right 2012   ROTATOR CUFF REPAIR Right    TOOTH EXTRACTION N/A 10/07/2015   Procedure: Extraction of tooth #18 with alveoloplasty;  Surgeon: Lenn Cal, DDS;  Location: [place];  Service: Oral Surgery;  Laterality: N/A;   TUBAL LIGATION      OB History   No obstetric history on file.      Home Medications    Prior to Admission medications   Medication Sig Start Date End Date Taking? Authorizing Provider  ACCU-CHEK AVIVA PLUS test strip USE AS DIRECTED TO TEST BLOOD SUGARS 4 TIMES A DAY 07/09/16   [provider]  albuterol (PROVENTIL HFA;VENTOLIN HFA) 108 (90 Base) MCG/ACT inhaler Inhale 2 puffs into the lungs every 6 (six) hours as needed for wheezing or shortness of breath. For wheezing 10/14/15   Angiulli, Lavon Paganini, PA-C  Ascorbic Acid (VITAMIN C) 100 MG tablet Take 250 mg by mouth daily.     [provider]  cholecalciferol (VITAMIN D) 1000 UNITS tablet Take 1,000 Units by  mouth daily with breakfast.    [provider]  cyclobenzaprine (FLEXERIL) 5 MG tablet Take 1 tablet (5 mg total) by mouth at bedtime. 07/08/18   Wurst, [place], PA-C  ECHINACEA PO Take 1 tablet by mouth daily.    [provider]  gabapentin (NEURONTIN) 100 MG capsule Take 1-2 capsules (100-200 mg total) by mouth 3 (three) times daily. Patient taking differently: Take 100-200 mg by mouth 2 (two) times daily.  11/08/17   Meredith Staggers, MD  ibuprofen (ADVIL,MOTRIN) 200 MG tablet Take 400 mg by mouth every 6 (six) hours as needed for headache or mild pain.    [provider]  metFORMIN (GLUCOPHAGE) 500 MG tablet Take 500 mg by mouth 2  (two) times daily with a meal.    [provider]  mometasone (NASONEX) 50 MCG/ACT nasal spray Place 1 spray into the nose as needed (congestion).    [provider]  Multiple Vitamin (MULTIVITAMIN WITH MINERALS) TABS tablet Take 1 tablet by mouth daily.    [provider]  naproxen (NAPROSYN) 375 MG tablet Take 1 tablet (375 mg total) by mouth 2 (two) times daily. 07/08/18   Wurst, [place], PA-C  oxyCODONE-acetaminophen (PERCOCET) 5-325 MG tablet Take 1 tablet by mouth daily as needed for moderate pain. Patient not taking: Reported on 08/12/2018 03/03/18   Bayard Hugger, NP  predniSONE (DELTASONE) 10 MG tablet Begin with 6 tabs on day 1, 5 tab on day 2, 4 tab on day 3, 3 tab on day 4, 2 tab on day 5, 1 tab on day 6-take with food 08/04/20   Osbaldo Mark C, PA-C  tiZANidine (ZANAFLEX) 4 MG tablet Take 1 tablet (4 mg total) by mouth every 6 (six) hours as needed for muscle spasms. 08/04/20   Aijalon Demuro C, PA-C  traMADol (ULTRAM) 50 MG tablet Take 1 tablet (50 mg total) by mouth every 6 (six) hours as needed. 08/12/18   Veryl Speak, MD    Family History Family History  Problem Relation Age of Onset   Colon cancer Mother    Alcohol abuse Father    Diabetes Father    Brain cancer Father    Leukemia Sister    Heart attack Paternal Grandmother    Hypertension Sister    Hypertension Sister     Social History Social History   Tobacco Use   Smoking status: Never Smoker   Smokeless tobacco: Never Used  Scientific laboratory technician Use: Never used  Substance Use Topics   Alcohol use: Yes    Comment: occasionally   Drug use: No     Allergies   Voltaren [diclofenac sodium]   Review of Systems Review of Systems  Constitutional: Negative for fatigue and fever.  Eyes: Negative for visual disturbance.  Respiratory: Negative for shortness of breath.   Cardiovascular: Negative for chest pain.  Gastrointestinal: Negative for abdominal pain,  nausea and vomiting.  Musculoskeletal: Positive for arthralgias, back pain and myalgias. Negative for joint swelling.  Skin: Negative for color change, rash and wound.  Neurological: Negative for dizziness, weakness, light-headedness and headaches.     Physical Exam Triage Vital Signs ED Triage Vitals  Enc Vitals Group     BP      Pulse      Resp      Temp      Temp src      SpO2      Weight      Height  Head Circumference      Peak Flow      Pain Score      Pain Loc      Pain Edu?      Excl. in [place]?    No data found.  Updated Vital Signs BP (!) 159/77 (BP Location: Right Arm)    Pulse 76    Temp 98.3 F (36.8 C) (Oral)    Resp 18    LMP 09/27/2011    SpO2 96%   Visual Acuity Right Eye Distance:   Left Eye Distance:   Bilateral Distance:    Right Eye Near:   Left Eye Near:    Bilateral Near:     Physical Exam Vitals and nursing note reviewed.  Constitutional:      Appearance: She is well-developed.     Comments: No acute distress  HENT:     Head: Normocephalic and atraumatic.     Nose: Nose normal.  Eyes:     Conjunctiva/sclera: Conjunctivae normal.  Cardiovascular:     Rate and Rhythm: Normal rate.  Pulmonary:     Effort: Pulmonary effort is normal. No respiratory distress.  Abdominal:     General: There is no distension.  Musculoskeletal:        General: Normal range of motion.     Cervical back: Neck supple.     Comments: Nontender to palpation of cervical and thoracic spine midline, increased tenderness to palpation of right thoracic musculature most prominently in periscapular area Full active range of motion of right shoulder elbow and wrist  Skin:    General: Skin is warm and dry.  Neurological:     Mental Status: She is alert and oriented to person, place, and time.      UC Treatments / Results  Labs (all labs ordered are listed, but only abnormal results are displayed) Labs Reviewed - No data to display  EKG   Radiology No  results found.  Procedures Procedures (including critical care time)  Medications Ordered in UC Medications  ketorolac (TORADOL) 30 MG/ML injection 30 mg (has no administration in time range)    Initial Impression / Assessment and Plan / UC Course  I have reviewed the triage vital signs and the nursing notes.  Pertinent labs & imaging results that were available during my care of the patient were reviewed by me and considered in my medical decision making (see chart for details).     Acute on chronic back pain, will provide Toradol today, will continue with NSAIDs given patient received booster today, providing taper to begin in approximately 48 to 72 hours.  Tizanidine to supplement at bedtime.  Follow-up Ortho as planned.  Discussed strict return precautions. Patient verbalized understanding and is agreeable with plan.  Final Clinical Impressions(s) / UC Diagnoses   Final diagnoses:  Acute right-sided thoracic back pain     Discharge Instructions     Toradol today Use anti-inflammatories for pain/swelling. You may take up to 800 mg Ibuprofen every 8 hours with food. You may supplement Ibuprofen with Tylenol [id number] mg every 4-6 hours.  After 48 hours after booster may begin prednisone taper- 6,5,4,3,2,1- take with food, do not take with ibuprofen/motrin at the same time  Follow up with ortho as planned      ED Prescriptions    Medication Sig Dispense Auth. Provider   predniSONE (DELTASONE) 10 MG tablet Begin with 6 tabs on day 1, 5 tab on day 2, 4 tab on day 3, 3 tab  on day 4, 2 tab on day 5, 1 tab on day 6-take with food 21 tablet Mariaceleste Herrera C, PA-C   tiZANidine (ZANAFLEX) 4 MG tablet Take 1 tablet (4 mg total) by mouth every 6 (six) hours as needed for muscle spasms. 30 tablet Jeralynn Vaquera, [place] C, PA-C     PDMP not reviewed this encounter.   Janith Lima, PA-C 08/04/20 1723

## 2020-08-04 NOTE — ED Triage Notes (Signed)
Pt presents with chronic bilateral neck & shoulder pain that is progressing ( non injury related); Pt scheduled to see orthopedic December 2nd

## 2020-08-04 NOTE — Discharge Instructions (Signed)
Toradol today Use anti-inflammatories for pain/swelling. You may take up to 800 mg Ibuprofen every 8 hours with food. You may supplement Ibuprofen with Tylenol [phone] mg every 4-6 hours.  After 48 hours after booster may begin prednisone taper- 6,5,4,3,2,1- take with food, do not take with ibuprofen/motrin at the same time  Follow up with ortho as planned

## 2020-08-10 ENCOUNTER — Other Ambulatory Visit: Payer: Self-pay | Admitting: Physical Medicine and Rehabilitation

## 2020-08-10 DIAGNOSIS — M25511 Pain in right shoulder: Secondary | ICD-10-CM

## 2020-08-10 DIAGNOSIS — M542 Cervicalgia: Secondary | ICD-10-CM

## 2020-09-01 ENCOUNTER — Other Ambulatory Visit: Payer: Medicaid Other

## 2020-09-08 ENCOUNTER — Ambulatory Visit
Admission: RE | Admit: 2020-09-08 | Discharge: 2020-09-08 | Disposition: A | Discharge status: Home / Self Care | Payer: Medicaid Other | Source: Ambulatory Visit | Attending: Physical Medicine and Rehabilitation | Admitting: Physical Medicine and Rehabilitation

## 2020-09-08 ENCOUNTER — Other Ambulatory Visit: Payer: Self-pay

## 2020-09-08 ENCOUNTER — Other Ambulatory Visit: Payer: Medicaid Other

## 2020-09-08 DIAGNOSIS — M542 Cervicalgia: Secondary | ICD-10-CM

## 2020-09-21 ENCOUNTER — Other Ambulatory Visit: Payer: Self-pay | Admitting: Orthopedic Surgery

## 2020-09-21 ENCOUNTER — Other Ambulatory Visit: Payer: Self-pay | Admitting: Physical Medicine and Rehabilitation

## 2020-09-21 ENCOUNTER — Other Ambulatory Visit: Payer: Self-pay | Admitting: Specialist

## 2020-09-21 DIAGNOSIS — M25511 Pain in right shoulder: Secondary | ICD-10-CM

## 2020-09-23 ENCOUNTER — Other Ambulatory Visit: Payer: Medicaid Other

## 2020-10-14 ENCOUNTER — Other Ambulatory Visit: Payer: Medicaid Other

## 2020-10-23 ENCOUNTER — Other Ambulatory Visit: Payer: Self-pay

## 2020-10-23 ENCOUNTER — Ambulatory Visit
Admission: RE | Admit: 2020-10-23 | Discharge: 2020-10-23 | Disposition: A | Discharge status: Home / Self Care | Payer: Medicaid Other | Source: Ambulatory Visit | Attending: Orthopedic Surgery | Admitting: Orthopedic Surgery

## 2020-10-23 DIAGNOSIS — M25511 Pain in right shoulder: Secondary | ICD-10-CM

## 2021-06-05 ENCOUNTER — Other Ambulatory Visit: Payer: Self-pay | Admitting: Orthopedic Surgery

## 2021-06-05 DIAGNOSIS — M25512 Pain in left shoulder: Secondary | ICD-10-CM

## 2021-06-15 ENCOUNTER — Other Ambulatory Visit: Payer: Medicaid Other

## 2021-06-24 ENCOUNTER — Other Ambulatory Visit: Payer: Medicaid Other

## 2021-07-06 ENCOUNTER — Other Ambulatory Visit: Payer: Self-pay | Admitting: Physical Medicine and Rehabilitation

## 2021-07-06 DIAGNOSIS — M542 Cervicalgia: Secondary | ICD-10-CM

## 2021-07-07 ENCOUNTER — Other Ambulatory Visit: Payer: Self-pay | Admitting: Physical Medicine and Rehabilitation

## 2021-07-07 DIAGNOSIS — M25512 Pain in left shoulder: Secondary | ICD-10-CM

## 2021-07-08 ENCOUNTER — Other Ambulatory Visit: Payer: Medicaid Other

## 2021-07-22 ENCOUNTER — Other Ambulatory Visit: Payer: Self-pay

## 2021-07-22 ENCOUNTER — Ambulatory Visit
Admission: RE | Admit: 2021-07-22 | Discharge: 2021-07-22 | Disposition: A | Discharge status: Home / Self Care | Payer: Medicaid Other | Source: Ambulatory Visit | Attending: Physical Medicine and Rehabilitation | Admitting: Physical Medicine and Rehabilitation

## 2021-07-22 ENCOUNTER — Ambulatory Visit
Admission: RE | Admit: 2021-07-22 | Discharge: 2021-07-22 | Disposition: A | Discharge status: Home / Self Care | Payer: Medicaid Other | Source: Ambulatory Visit | Attending: Orthopedic Surgery | Admitting: Orthopedic Surgery

## 2021-07-22 DIAGNOSIS — M25512 Pain in left shoulder: Secondary | ICD-10-CM

## 2021-07-22 DIAGNOSIS — M542 Cervicalgia: Secondary | ICD-10-CM

## 2021-09-13 ENCOUNTER — Ambulatory Visit: Payer: Medicaid Other | Admitting: Obstetrics and Gynecology

## 2022-12-12 ENCOUNTER — Ambulatory Visit (INDEPENDENT_AMBULATORY_CARE_PROVIDER_SITE_OTHER): Payer: Medicaid Other | Admitting: Podiatry

## 2022-12-12 ENCOUNTER — Ambulatory Visit (INDEPENDENT_AMBULATORY_CARE_PROVIDER_SITE_OTHER): Payer: Medicaid Other

## 2022-12-12 ENCOUNTER — Encounter: Payer: Self-pay | Admitting: Podiatry

## 2022-12-12 DIAGNOSIS — M722 Plantar fascial fibromatosis: Secondary | ICD-10-CM

## 2022-12-12 MED ORDER — TRIAMCINOLONE ACETONIDE 10 MG/ML IJ SUSP
10.0000 mg | Freq: Once | INTRAMUSCULAR | Status: AC
  Administered 2022-12-12: 10 mg

## 2022-12-12 MED ORDER — MELOXICAM 15 MG PO TABS: 15.0000 mg | ORAL_TABLET | Freq: Every day | ORAL | 2 refills | Status: AC

## 2022-12-12 NOTE — Patient Instructions (Signed)

## 2022-12-12 NOTE — Progress Notes (Signed)
Subjective:   Patient ID: [name], female   DOB: 57 y.o.   MRN: [id number]   HPI Patient presents with intense discomfort plantar aspect right heel of 6 weeks duration.  States she does not remember specific injury but is been very active does not smoke likes to be active if possible   Review of Systems  All other systems reviewed and are negative.       Objective:  Physical Exam Vitals and nursing note reviewed.  Constitutional:      Appearance: She is well-developed.  Pulmonary:     Effort: Pulmonary effort is normal.  Musculoskeletal:        General: Normal range of motion.  Skin:    General: Skin is warm.  Neurological:     Mental Status: She is alert.     Neurovascular status intact muscle strength adequate range of motion within normal limits with exquisite discomfort plantar fascia right at the insertional point tendon calcaneus inflammation fluid around the medial band with moderate depression of the arch     Assessment:  Acute Plan[place] fasciitis right with inflammation fluid with moderate depression of the arch     Plan:  H&P reviewed sterile prep injected the plantar fascia right 3 mg Kenalog 5 mg Xylocaine applied fascial brace properly fitted into the arch gave instructions for stretching exercises shoe gear modification reappoint 2 weeks  X-rays indicate small spur no indication stress fracture arthritis and did place on Mobic that she should be able to tolerate but will stop if she gets itching

## 2023-10-13 ENCOUNTER — Encounter: Payer: Self-pay | Admitting: Gastroenterology
